# Patient Record
Sex: Male | Born: 1960 | Race: Black or African American | Hispanic: No | Smoking: Former smoker
Health system: Southern US, Community
[De-identification: ages and names within clinical notes are randomized; demographics above are authoritative.]

## PROBLEM LIST (undated history)

## (undated) DIAGNOSIS — K219 Gastro-esophageal reflux disease without esophagitis: Secondary | ICD-10-CM

## (undated) DIAGNOSIS — E785 Hyperlipidemia, unspecified: Secondary | ICD-10-CM

## (undated) DIAGNOSIS — R972 Elevated prostate specific antigen [PSA]: Secondary | ICD-10-CM

## (undated) DIAGNOSIS — G4733 Obstructive sleep apnea (adult) (pediatric): Secondary | ICD-10-CM

## (undated) DIAGNOSIS — F32A Depression, unspecified: Secondary | ICD-10-CM

## (undated) DIAGNOSIS — I10 Essential (primary) hypertension: Secondary | ICD-10-CM

## (undated) DIAGNOSIS — J189 Pneumonia, unspecified organism: Secondary | ICD-10-CM

## (undated) DIAGNOSIS — Z9989 Dependence on other enabling machines and devices: Secondary | ICD-10-CM

## (undated) DIAGNOSIS — E119 Type 2 diabetes mellitus without complications: Secondary | ICD-10-CM

## (undated) DIAGNOSIS — J9859 Other diseases of mediastinum, not elsewhere classified: Secondary | ICD-10-CM

## (undated) DIAGNOSIS — N529 Male erectile dysfunction, unspecified: Secondary | ICD-10-CM

## (undated) HISTORY — PX: PROSTATE BIOPSY: SHX241

---

## 1998-12-31 ENCOUNTER — Ambulatory Visit (HOSPITAL_BASED_OUTPATIENT_CLINIC_OR_DEPARTMENT_OTHER): Admission: RE | Admit: 1998-12-31 | Discharge: 1998-12-31 | Payer: Self-pay

## 2013-05-15 ENCOUNTER — Other Ambulatory Visit: Payer: Self-pay | Admitting: Family Medicine

## 2013-05-15 DIAGNOSIS — R1310 Dysphagia, unspecified: Secondary | ICD-10-CM

## 2013-05-21 ENCOUNTER — Ambulatory Visit
Admission: RE | Admit: 2013-05-21 | Discharge: 2013-05-21 | Disposition: A | Discharge status: Home / Self Care | Payer: 59 | Source: Ambulatory Visit | Attending: Family Medicine | Admitting: Family Medicine

## 2013-05-21 DIAGNOSIS — R1310 Dysphagia, unspecified: Secondary | ICD-10-CM

## 2014-08-10 ENCOUNTER — Ambulatory Visit
Admission: RE | Admit: 2014-08-10 | Discharge: 2014-08-10 | Disposition: A | Discharge status: Home / Self Care | Payer: 59 | Source: Ambulatory Visit | Attending: Family Medicine | Admitting: Family Medicine

## 2014-08-10 ENCOUNTER — Emergency Department (HOSPITAL_COMMUNITY): Payer: 59

## 2014-08-10 ENCOUNTER — Other Ambulatory Visit: Payer: Self-pay | Admitting: Family Medicine

## 2014-08-10 ENCOUNTER — Inpatient Hospital Stay (HOSPITAL_COMMUNITY)
Admission: EM | Admit: 2014-08-10 | Discharge: 2014-08-14 | DRG: 871 | Disposition: A | Discharge status: Home / Self Care | Payer: 59 | Attending: Internal Medicine | Admitting: Internal Medicine

## 2014-08-10 ENCOUNTER — Encounter (HOSPITAL_COMMUNITY): Payer: Self-pay | Admitting: Emergency Medicine

## 2014-08-10 DIAGNOSIS — D72829 Elevated white blood cell count, unspecified: Secondary | ICD-10-CM | POA: Diagnosis present

## 2014-08-10 DIAGNOSIS — R079 Chest pain, unspecified: Secondary | ICD-10-CM

## 2014-08-10 DIAGNOSIS — A419 Sepsis, unspecified organism: Principal | ICD-10-CM

## 2014-08-10 DIAGNOSIS — Z87891 Personal history of nicotine dependence: Secondary | ICD-10-CM

## 2014-08-10 DIAGNOSIS — F1010 Alcohol abuse, uncomplicated: Secondary | ICD-10-CM | POA: Diagnosis present

## 2014-08-10 DIAGNOSIS — K219 Gastro-esophageal reflux disease without esophagitis: Secondary | ICD-10-CM | POA: Diagnosis present

## 2014-08-10 DIAGNOSIS — B441 Other pulmonary aspergillosis: Secondary | ICD-10-CM | POA: Diagnosis present

## 2014-08-10 DIAGNOSIS — G4733 Obstructive sleep apnea (adult) (pediatric): Secondary | ICD-10-CM | POA: Diagnosis present

## 2014-08-10 DIAGNOSIS — J852 Abscess of lung without pneumonia: Secondary | ICD-10-CM | POA: Diagnosis present

## 2014-08-10 DIAGNOSIS — R0602 Shortness of breath: Secondary | ICD-10-CM | POA: Diagnosis not present

## 2014-08-10 DIAGNOSIS — J189 Pneumonia, unspecified organism: Secondary | ICD-10-CM | POA: Diagnosis present

## 2014-08-10 DIAGNOSIS — J9600 Acute respiratory failure, unspecified whether with hypoxia or hypercapnia: Secondary | ICD-10-CM | POA: Diagnosis present

## 2014-08-10 DIAGNOSIS — Z79899 Other long term (current) drug therapy: Secondary | ICD-10-CM | POA: Diagnosis not present

## 2014-08-10 DIAGNOSIS — L723 Sebaceous cyst: Secondary | ICD-10-CM | POA: Diagnosis present

## 2014-08-10 DIAGNOSIS — E669 Obesity, unspecified: Secondary | ICD-10-CM | POA: Diagnosis present

## 2014-08-10 DIAGNOSIS — J984 Other disorders of lung: Secondary | ICD-10-CM

## 2014-08-10 DIAGNOSIS — E785 Hyperlipidemia, unspecified: Secondary | ICD-10-CM | POA: Diagnosis present

## 2014-08-10 DIAGNOSIS — Z6836 Body mass index (BMI) 36.0-36.9, adult: Secondary | ICD-10-CM

## 2014-08-10 DIAGNOSIS — J9601 Acute respiratory failure with hypoxia: Secondary | ICD-10-CM

## 2014-08-10 HISTORY — DX: Pneumonia, unspecified organism: J18.9

## 2014-08-10 HISTORY — DX: Hyperlipidemia, unspecified: E78.5

## 2014-08-10 HISTORY — DX: Obstructive sleep apnea (adult) (pediatric): Z99.89

## 2014-08-10 HISTORY — DX: Gastro-esophageal reflux disease without esophagitis: K21.9

## 2014-08-10 HISTORY — DX: Obstructive sleep apnea (adult) (pediatric): G47.33

## 2014-08-10 LAB — CBC
HEMATOCRIT: 41.2 % (ref 39.0–52.0)
Hemoglobin: 13.7 g/dL (ref 13.0–17.0)
MCH: 27.5 pg (ref 26.0–34.0)
MCHC: 33.3 g/dL (ref 30.0–36.0)
MCV: 82.7 fL (ref 78.0–100.0)
Platelets: 301 10*3/uL (ref 150–400)
RBC: 4.98 MIL/uL (ref 4.22–5.81)
RDW: 13.9 % (ref 11.5–15.5)
WBC: 16.3 10*3/uL — ABNORMAL HIGH (ref 4.0–10.5)

## 2014-08-10 LAB — BASIC METABOLIC PANEL
Anion gap: 18 — ABNORMAL HIGH (ref 5–15)
BUN: 13 mg/dL (ref 6–23)
CO2: 23 mEq/L (ref 19–32)
Calcium: 9.2 mg/dL (ref 8.4–10.5)
Chloride: 94 mEq/L — ABNORMAL LOW (ref 96–112)
Creatinine, Ser: 0.98 mg/dL (ref 0.50–1.35)
GFR calc Af Amer: >90 mL/min (ref >90)
GLUCOSE: 94 mg/dL (ref 70–99)
Potassium: 4.1 mEq/L (ref 3.7–5.3)
SODIUM: 135 meq/L — AB (ref 137–147)

## 2014-08-10 LAB — I-STAT TROPONIN, ED: Troponin i, poc: 0.11 ng/mL (ref 0.00–0.08)

## 2014-08-10 LAB — TROPONIN I: Troponin I: <0.3 ng/mL (ref <0.30)

## 2014-08-10 LAB — RAPID HIV SCREEN (WH-MAU): SUDS RAPID HIV SCREEN: NONREACTIVE

## 2014-08-10 MED ORDER — ACETAMINOPHEN 325 MG PO TABS
650.0000 mg | ORAL_TABLET | Freq: Once | ORAL | Status: AC
  Administered 2014-08-10: 650 mg via ORAL
  Filled 2014-08-10: qty 2

## 2014-08-10 MED ORDER — SODIUM CHLORIDE 0.9 % IV SOLN
1250.0000 mg | Freq: Two times a day (BID) | INTRAVENOUS | Status: DC
  Administered 2014-08-11 – 2014-08-14 (×7): 1250 mg via INTRAVENOUS
  Filled 2014-08-10 (×9): qty 1250

## 2014-08-10 MED ORDER — PANTOPRAZOLE SODIUM 40 MG PO TBEC
40.0000 mg | DELAYED_RELEASE_TABLET | Freq: Every day | ORAL | Status: DC
  Administered 2014-08-11 – 2014-08-14 (×4): 40 mg via ORAL
  Filled 2014-08-10 (×4): qty 1

## 2014-08-10 MED ORDER — ONDANSETRON HCL 4 MG PO TABS: 4.0000 mg | ORAL_TABLET | Freq: Four times a day (QID) | ORAL | Status: DC | PRN

## 2014-08-10 MED ORDER — HYDROMORPHONE HCL PF 1 MG/ML IJ SOLN
1.0000 mg | Freq: Once | INTRAMUSCULAR | Status: AC
  Administered 2014-08-10: 1 mg via INTRAVENOUS
  Filled 2014-08-10: qty 1

## 2014-08-10 MED ORDER — IPRATROPIUM BROMIDE 0.02 % IN SOLN
0.5000 mg | RESPIRATORY_TRACT | Status: DC | PRN
Start: 2014-08-10 — End: 2014-08-14

## 2014-08-10 MED ORDER — SIMVASTATIN 20 MG PO TABS
20.0000 mg | ORAL_TABLET | Freq: Every day | ORAL | Status: DC
  Administered 2014-08-11 – 2014-08-13 (×3): 20 mg via ORAL
  Filled 2014-08-10 (×4): qty 1
  Filled 2014-08-10: qty 2

## 2014-08-10 MED ORDER — VANCOMYCIN HCL 10 G IV SOLR
2000.0000 mg | INTRAVENOUS | Status: AC
  Filled 2014-08-10: qty 2000

## 2014-08-10 MED ORDER — ACETAMINOPHEN 650 MG RE SUPP: 650.0000 mg | Freq: Four times a day (QID) | RECTAL | Status: DC | PRN

## 2014-08-10 MED ORDER — PIPERACILLIN-TAZOBACTAM 3.375 G IVPB 30 MIN
3.3750 g | INTRAVENOUS | Status: AC
  Administered 2014-08-10: 3.375 g via INTRAVENOUS
  Filled 2014-08-10: qty 50

## 2014-08-10 MED ORDER — ACETAMINOPHEN 325 MG PO TABS
650.0000 mg | ORAL_TABLET | Freq: Four times a day (QID) | ORAL | Status: DC | PRN
  Administered 2014-08-11 – 2014-08-12 (×5): 650 mg via ORAL
  Filled 2014-08-10 (×5): qty 2

## 2014-08-10 MED ORDER — SODIUM CHLORIDE 0.9 % IV SOLN
INTRAVENOUS | Status: AC
  Administered 2014-08-11: 02:00:00 via INTRAVENOUS

## 2014-08-10 MED ORDER — PIPERACILLIN-TAZOBACTAM 3.375 G IVPB
3.3750 g | Freq: Three times a day (TID) | INTRAVENOUS | Status: DC
  Administered 2014-08-11 – 2014-08-14 (×10): 3.375 g via INTRAVENOUS
  Filled 2014-08-10 (×14): qty 50

## 2014-08-10 MED ORDER — IOHEXOL 300 MG/ML  SOLN
80.0000 mL | Freq: Once | INTRAMUSCULAR | Status: AC | PRN
  Administered 2014-08-10: 80 mL via INTRAVENOUS

## 2014-08-10 MED ORDER — SODIUM CHLORIDE 0.9 % IV BOLUS (SEPSIS)
1000.0000 mL | INTRAVENOUS | Status: AC
  Administered 2014-08-10: 1000 mL via INTRAVENOUS

## 2014-08-10 MED ORDER — LEVALBUTEROL HCL 0.63 MG/3ML IN NEBU
0.6300 mg | INHALATION_SOLUTION | RESPIRATORY_TRACT | Status: DC | PRN
  Filled 2014-08-10: qty 3

## 2014-08-10 MED ORDER — SODIUM CHLORIDE 0.9 % IV SOLN
INTRAVENOUS | Status: AC
  Administered 2014-08-10: via INTRAVENOUS

## 2014-08-10 MED ORDER — ONDANSETRON HCL 4 MG/2ML IJ SOLN: 4.0000 mg | Freq: Four times a day (QID) | INTRAMUSCULAR | Status: DC | PRN

## 2014-08-10 MED ORDER — ENOXAPARIN SODIUM 40 MG/0.4ML ~~LOC~~ SOLN
40.0000 mg | SUBCUTANEOUS | Status: DC
  Administered 2014-08-10: 40 mg via SUBCUTANEOUS
  Filled 2014-08-10 (×2): qty 0.4

## 2014-08-10 NOTE — ED Notes (Signed)
Pt arrives via POV from home with cough, fever to 103.2 on Friday. Seen by [facility] Friday. Started on Aleve. Went to see Pcp today had bloodwork and chest xray which showed r lobe pneumonia and pulmonary absess. . Provider wants to rule out TB. Pt denies bloody cough, weight loss, states some night sweats. Placed on droplet precautions at present. Last aleve 10am and last tylenol 1300. Pt awake, alert, oriented x4, NAD at present.

## 2014-08-10 NOTE — Progress Notes (Signed)
Received pt report from Danielle, RN -ED. 

## 2014-08-10 NOTE — ED Notes (Signed)
Harrison, MD aware of abnormal lab test results 

## 2014-08-10 NOTE — H&P (Addendum)
[facility] History and Physical  Koben Daman ION:[id number] DOB: [birth year] DOA: 08/10/2014  Referring physician: ER physician PCP: No primary provider on file.   Chief Complaint: shortness of breath, chest pain  HPI:  53 year old male with no significant past medical history who presented to [facility] ED 08/10/2014 with complaints of cough, shortness of breath, fever for past few days prior to this admission. He was seen by PCP prior to coming to ED and his CXR showed possible lung abscess or TB and he was told to go to ED for further evaluation. Pt reported having some night sweats but no weight loss. He had some right chest pain, non radiating, 5/10 in intensity which was present at rest, not alleviated with aleve. Of note, he was in [place] for vacation this summer but did not report having these symptoms when he arrived home.  In ED, BP was 146/84, HR 107-125, RR 23, T max 103.1 F. Oxygen saturation was 91% with [place] oxygen support at 2 L. WBC count was 16.3 otherwise unremarkable. The troponin level was WNL. The 12 lead EKG showed sinus tachycardia. CXR showed cavitary and thick walled right lower lobe lesion with air-fluid level. CT chest showed large uniformly thick-walled cavitary lesion with air-fluid level over the superior segment of the right lower lobe measuring 8 x 10.4 cm, some cystic bronchiectatic changes, mediastinal/ subcarinal adenopathy and 3-4 mm nodule over the right upper lobe. cavitary process.  Assessment & Plan    Principal Problem:   Acute respiratory failure with hypoxia  Secondary to possible pneumonia, TB, possible malignancy  Respiratory status stable at this time. May use [place] for oxygen support to keep O2 saturation above 90%.  Nebulizer treatments every 4 hours PRN   Started antibiotics for possible pneumonia, vanco and zosyn. Pneumonia order set in place. Follow up blood work related to pneumonia order set.  Obtain AFB sputum x 3 to rule out  AFB.  Airborne and droplet precaution   Active Problems: Sepsis secondary to pneumonia   Sepsis criteria met with initial vitals HR 107-125, RR 23, T max 103.1 F, WBC count 16.3 and evidence of possible infectious process based on CT chest. Chest pain  Likely due to pneumonia, possible TB, ling abscess, malignancy  Troponin level is WNL. The 12 lead EKG showed sinus tachycardia CAP (community acquired pneumonia) / Leukocytosis  Management as noted above with vanco and zosyn for possible pneumonia.  Pneumonia order set in place. Follow up blood culture results,. Legionella, HIV, strep pneumoniae results. Follow up AFB sputum to rule out TB.  Asked pulmonary if bronch may be required for further evaluation.  Oxygen support via [place] oxygen support to keep O2 saturation above 90%  Cavitary lesion of lung, superior segment of right lower lobe  Obvious concern for TB as well as abscess and malignancy especially with additional lung nodule seen over right upper lung lobe.   Droplet precaution, airborne precaution  Obtain AFB sputum to rule out AFB DVT prophylaxis:   Lovenox subQ    Radiological Exams on Admission:  Dg Chest 2 View 08/10/2014   1. Cavitary and thick walled right lower lobe lesion with air-fluid level. Differential diagnosis includes cavitary pneumonia such a staphylococcal pneumonia; cavitary malignancy ; or a large pulmonary abscess. It is recommended that the patient proceed to the emergency room and subsequently undergo CT of the chest (with contrast if feasible) for further characterization.  This report is being upgraded to a call report to expedite treatment.  Ct Chest W Contrast 08/10/2014  Large uniformly thick-walled cavitary lesion with air-fluid level over the superior segment of the right lower lobe measuring 8 x 10.4 cm in AP and transverse dimension. Mild adjacent atelectasis and cystic bronchiectatic change. Mild associated mediastinal/ subcarinal  adenopathy. The main differential diagnosis remains cavitary pneumonia/ abscess versus malignancy.  3-4 mm nodule over the right upper lobe. Recommend followup as clinically indicated pending etiology of the above described cavitary process.    EKG: sinus tachycardia  Code Status: Full Family Communication: Plan of care discussed with the patient  Disposition Plan: Admit for further evaluation  Leisa Lenz, MD  [facility] Pager [phone]  Review of Systems:  Constitutional: positive for fever, chills and malaise/fatigue. Negative for diaphoresis.  HENT: Negative for hearing loss, ear pain, nosebleeds, congestion, sore throat, neck pain, tinnitus and ear discharge.   Eyes: Negative for blurred vision, double vision, photophobia, pain, discharge and redness.  Respiratory:   per HPI Cardiovascular: positive for chest pain, palpitations, orthopnea, claudication and leg swelling.  Gastrointestinal: Negative for nausea, vomiting and abdominal pain. Negative for heartburn, constipation, blood in stool and melena.  Genitourinary: Negative for dysuria, urgency, frequency, hematuria and flank pain.  Musculoskeletal: Negative for myalgias, back pain, joint pain and falls.  Skin: Negative for itching and rash.  Neurological: Negative for dizziness and weakness. Negative for tingling, tremors, sensory change, speech change, focal weakness, loss of consciousness and headaches.  Endo/Heme/Allergies: Negative for environmental allergies and polydipsia. Does not bruise/bleed easily.  Psychiatric/Behavioral: Negative for suicidal ideas. The patient is not nervous/anxious.      Past Medical History  Diagnosis Date  . Hyperlipemia   . GERD (gastroesophageal reflux disease)    No past surgical history on file. Social History:  reports that he quit smoking about 7 years ago. He does not have any smokeless tobacco history on file. He reports that he drinks alcohol. He reports that he does not use  illicit drugs.  No Known Allergies  Family History: htn in family    Prior to Admission medications   Medication Sig Start Date End Date Taking? Authorizing Provider  omeprazole (PRILOSEC) 20 MG capsule Take 20 mg by mouth daily.   Yes Historical Provider, MD  pravastatin (PRAVACHOL) 40 MG tablet Take 40 mg by mouth daily.   Yes Historical Provider, MD  sildenafil (VIAGRA) 100 MG tablet Take 100 mg by mouth daily as needed for erectile dysfunction.   Yes Historical Provider, MD   Physical Exam: Filed Vitals:   08/10/14 1900 08/10/14 1915 08/10/14 1930 08/10/14 1945  BP: 171/94 159/88 147/79 146/77  Pulse: 109 107 110 109  Temp:      TempSrc:      Resp: 18 0 23 20  SpO2: 96% 91% 93% 93%    Physical Exam  Constitutional: Appears well-developed and well-nourished. No distress.  HENT: Normocephalic. No tonsillar erythema or exudates Eyes: Conjunctivae and EOM are normal. PERRLA, no scleral icterus.  Neck: Normal ROM. Neck supple. No JVD. No tracheal deviation. No thyromegaly.  CVS: RRR, S1/S2 +, no murmurs, no gallops, no carotid bruit.  Pulmonary: Effort and breath sounds normal, no stridor, rhonchi, wheezes, rales.  Abdominal: Soft. BS +,  no distension, tenderness, rebound or guarding.  Musculoskeletal: Normal range of motion. No edema and no tenderness.  Lymphadenopathy: No lymphadenopathy noted, cervical, inguinal. Neuro: Alert. Normal reflexes, muscle tone coordination. No focal neurologic deficits. Skin: Skin is warm and dry. No rash noted. Not diaphoretic. No erythema. No pallor.  Psychiatric: Normal mood and affect. Behavior, judgment, thought content normal.   Labs on Admission:  Basic Metabolic Panel:  Recent Labs Lab 08/10/14 1733  NA 135*  K 4.1  CL 94*  CO2 23  GLUCOSE 94  BUN 13  CREATININE 0.98  CALCIUM 9.2   Liver Function Tests: No results found for this basename: AST, ALT, ALKPHOS, BILITOT, PROT, ALBUMIN,  in the last 168 hours No results found  for this basename: LIPASE, AMYLASE,  in the last 168 hours No results found for this basename: AMMONIA,  in the last 168 hours CBC:  Recent Labs Lab 08/10/14 1733  WBC 16.3*  HGB 13.7  HCT 41.2  MCV 82.7  PLT 301   Cardiac Enzymes:  Recent Labs Lab 08/10/14 1826  TROPONINI <0.30   BNP: No components found with this basename: POCBNP,  CBG: No results found for this basename: GLUCAP,  in the last 168 hours  If 7PM-7AM, please contact night-coverage [email or web address] Password [facility] 08/10/2014, 8:07 PM

## 2014-08-10 NOTE — Progress Notes (Signed)
ANTIBIOTIC CONSULT NOTE - INITIAL  Pharmacy Consult for Vancomycin and Zosyn Indication: pneumonia  No Known Allergies  Patient Measurements: Height: 6\' 2"  (188 cm) Weight: 285 lb (129.275 kg) IBW/kg (Calculated) : 82.2 Adjusted Body Weight: 97 kg  Vital Signs: Temp: 103.1 F (39.5 C) (09/14 1658) Temp src: Oral (09/14 1658) BP: 146/77 mmHg (09/14 1945) Pulse Rate: 109 (09/14 1945) Intake/Output from previous day:   Intake/Output from this shift:    Labs:  Recent Labs  08/10/14 1733  WBC 16.3*  HGB 13.7  PLT 301  CREATININE 0.98   Estimated Creatinine Clearance: 128.8 ml/min (by C-G formula based on Cr of 0.98). No results found for this basename: VANCOTROUGH, VANCOPEAK, VANCORANDOM, GENTTROUGH, GENTPEAK, GENTRANDOM, TOBRATROUGH, TOBRAPEAK, TOBRARND, AMIKACINPEAK, AMIKACINTROU, AMIKACIN,  in the last 72 hours   Microbiology: No results found for this or any previous visit (from the past 720 hour(s)).  Medical History: Past Medical History  Diagnosis Date  . Hyperlipemia   . GERD (gastroesophageal reflux disease)     Medications:  See electronic med rec  Assessment: 53 y.o. male presents with cough and fever. CXR at PCP showed R lobe PNA and pulmonary abscess so sent to the ED. Need to r/o TB. Pt received Zosyn 3.375gm in ED ~1950 and orders for Vancomycin 2gm to be given. To continue broad spectrum antibiotics for PNA. Estimated CrCl > 100 ml/min.  Goal of Therapy:  Vancomycin trough level 15-20 mcg/ml  Plan:  1. Vancomycin 2gm IV now (as ordered by MD) then 1250mg  IV q12h 2. Zosyn 3.375gm IV q8h -each dose over 4 hours 3. Will f/u Vanc trough at [facility] in obese pt 4. Will f/u renal function, micro data, and pt's clinical condition  Sherlon Handing, PharmD, BCPS Clinical pharmacist, pager [phone] 08/10/2014,8:25 PM

## 2014-08-10 NOTE — ED Notes (Addendum)
IV antibiotics started before set #2 of blood cultures was drawn. Dr. Aline Brochure made aware, verbal order to still collect set #2.

## 2014-08-10 NOTE — ED Provider Notes (Signed)
CSN: [id number]     Arrival date & time 08/10/14  1635 History   First MD Initiated Contact with Patient 08/10/14 1742     Chief Complaint  Patient presents with  . Shortness of Breath  . Fever     (Consider location/radiation/quality/duration/timing/severity/associated sxs/prior Treatment) Patient is a 53 y.o. male presenting with fever and chest pain. The history is provided by the patient.  Fever Associated symptoms: chest pain   Associated symptoms: no diarrhea, no dysuria, no nausea and no rhinorrhea   Chest Pain Pain location:  R chest Pain quality: sharp   Pain radiates to:  Does not radiate Pain radiates to the back: yes   Pain severity:  Mild Onset quality:  Gradual Duration:  1 day Timing:  Constant Progression:  Unchanged Chronicity:  New Context: breathing and at rest   Relieved by:  Nothing Worsened by:  Nothing tried Ineffective treatments:  None tried Associated symptoms: fever   Associated symptoms: no nausea and no numbness     Past Medical History  Diagnosis Date  . Hyperlipemia   . GERD (gastroesophageal reflux disease)    No past surgical history on file. No family history on file. History  Substance Use Topics  . Smoking status: Former Smoker    Quit date: 11/27/2006  . Smokeless tobacco: Not on file  . Alcohol Use: Yes     Comment: occasional    Review of Systems  Constitutional: Positive for fever.  HENT: Negative for drooling and rhinorrhea.   Eyes: Negative for pain.  Cardiovascular: Positive for chest pain. Negative for leg swelling.  Gastrointestinal: Negative for nausea and diarrhea.  Genitourinary: Negative for dysuria and hematuria.  Musculoskeletal: Negative for gait problem.  Skin: Negative for color change.  Neurological: Negative for numbness.  Hematological: Negative for adenopathy.  Psychiatric/Behavioral: Negative for behavioral problems.  All other systems reviewed and are negative.     Allergies  Review of  patient's allergies indicates no known allergies.  Home Medications   Prior to Admission medications   Not on File   BP 150/82  Pulse 112  Temp(Src) 103.1 F (39.5 C) (Oral)  Resp 21  SpO2 98% Physical Exam  Nursing note and vitals reviewed. Constitutional: He is oriented to person, place, and time. He appears well-developed and well-nourished.  HENT:  Head: Normocephalic and atraumatic.  Right Ear: External ear normal.  Left Ear: External ear normal.  Nose: Nose normal.  Mouth/Throat: Oropharynx is clear and moist. No oropharyngeal exudate.  Eyes: Conjunctivae and EOM are normal. Pupils are equal, round, and reactive to light.  Neck: Normal range of motion. Neck supple.  Cardiovascular: Regular rhythm, normal heart sounds and intact distal pulses.  Exam reveals no gallop and no friction rub.   No murmur heard. HR 112  Pulmonary/Chest: Effort normal. No respiratory distress. He has no wheezes.  Mildly diminished breath sounds in right lung base.  Abdominal: Soft. Bowel sounds are normal. He exhibits no distension. There is no tenderness. There is no rebound and no guarding.  Musculoskeletal: Normal range of motion. He exhibits no edema and no tenderness.  Neurological: He is alert and oriented to person, place, and time.  Skin: Skin is warm and dry.  Psychiatric: He has a normal mood and affect. His behavior is normal.    ED Course  Procedures (including critical care time) Labs Review Labs Reviewed  CBC - Abnormal; Notable for the following:    WBC 16.3 (*)    All other components  within normal limits  I-STAT TROPOININ, ED - Abnormal; Notable for the following:    Troponin i, poc 0.11 (*)    All other components within normal limits  BASIC METABOLIC PANEL    Imaging Review Dg Chest 2 View  08/10/2014   CLINICAL DATA:  Right-sided chest pain. Difficulty breathing for 2 days.  EXAM: CHEST  2 VIEW  COMPARISON:  05/21/2013 upper GI examination.  FINDINGS: Cavitary 12.9  cm in diameter right lower lobe lesion with internal air- fluid level. The left lung appears clear. Heart size within normal limits.  IMPRESSION: 1. Cavitary and thick walled right lower lobe lesion with air-fluid level. Differential diagnosis includes cavitary pneumonia such a staphylococcal pneumonia; cavitary malignancy ; or a large pulmonary abscess. It is recommended that the patient proceed to the emergency room and subsequently undergo CT of the chest (with contrast if feasible) for further characterization.  This report is being upgraded to a call report to expedite treatment.   Electronically Signed   By: Sherryl Barters M.D.   On: 08/10/2014 15:53     EKG Interpretation   Date/Time:  Monday August 10 2014 16:50:49 EDT Ventricular Rate:  122 PR Interval:  142 QRS Duration: 84 QT Interval:  320 QTC Calculation: 456 R Axis:   -45 Text Interpretation:  Sinus tachycardia Left anterior fascicular block  Abnormal ECG Confirmed by Maili Shutters  MD, Zigmund Linse ([id number]) on 08/10/2014  5:58:08 PM      MDM   Final diagnoses:  Pulmonary abscess  Sepsis, due to unspecified organism    6:11 PM 53 y.o. male who presents with fever and fatigue which began 4 days ago. He developed right-sided sharp back and chest pains while breathing last night. He had a chest x-ray today with which was concerning for a right-sided pulmonary abscess. He is febrile and tachycardic on exam. He is otherwise well appearing. He is not hypoxic and has no appreciable increased worker breathing. Will get CT as requested by radiologist on chest x-ray for further characterization of the right-sided pulmonary abscess. Will give Tylenol, IV fluids, and pain control. Will cover w/ broad spectrum abx.   Discussed case w/ pulmonary who will see the pt tomorrow. Will admit to hospitalist.     Pamella Pert, MD 08/10/14 2213

## 2014-08-11 DIAGNOSIS — F1010 Alcohol abuse, uncomplicated: Secondary | ICD-10-CM

## 2014-08-11 DIAGNOSIS — J852 Abscess of lung without pneumonia: Secondary | ICD-10-CM

## 2014-08-11 DIAGNOSIS — R079 Chest pain, unspecified: Secondary | ICD-10-CM

## 2014-08-11 LAB — STREP PNEUMONIAE URINARY ANTIGEN: STREP PNEUMO URINARY ANTIGEN: NEGATIVE

## 2014-08-11 LAB — CBC WITH DIFFERENTIAL/PLATELET
BASOS ABS: 0 10*3/uL (ref 0.0–0.1)
BASOS PCT: 0 % (ref 0–1)
Eosinophils Absolute: 0.2 10*3/uL (ref 0.0–0.7)
Eosinophils Relative: 1 % (ref 0–5)
HCT: 39.9 % (ref 39.0–52.0)
HEMOGLOBIN: 13.2 g/dL (ref 13.0–17.0)
LYMPHS PCT: 9 % — AB (ref 12–46)
Lymphs Abs: 1.6 10*3/uL (ref 0.7–4.0)
MCH: 27 pg (ref 26.0–34.0)
MCHC: 33.1 g/dL (ref 30.0–36.0)
MCV: 81.8 fL (ref 78.0–100.0)
MONO ABS: 1.3 10*3/uL — AB (ref 0.1–1.0)
Monocytes Relative: 7 % (ref 3–12)
NEUTROS PCT: 83 % — AB (ref 43–77)
Neutro Abs: 15.1 10*3/uL — ABNORMAL HIGH (ref 1.7–7.7)
PLATELETS: 309 10*3/uL (ref 150–400)
RBC: 4.88 MIL/uL (ref 4.22–5.81)
RDW: 14 % (ref 11.5–15.5)
WBC: 18.2 10*3/uL — ABNORMAL HIGH (ref 4.0–10.5)

## 2014-08-11 LAB — URINALYSIS, ROUTINE W REFLEX MICROSCOPIC
Glucose, UA: NEGATIVE mg/dL
Ketones, ur: 40 mg/dL — AB
LEUKOCYTES UA: NEGATIVE
Nitrite: NEGATIVE
PH: 5.5 (ref 5.0–8.0)
Protein, ur: 100 mg/dL — AB
Specific Gravity, Urine: 1.01 (ref 1.005–1.030)
Urobilinogen, UA: 1 mg/dL (ref 0.0–1.0)

## 2014-08-11 LAB — COMPREHENSIVE METABOLIC PANEL
ALBUMIN: 2.6 g/dL — AB (ref 3.5–5.2)
ALBUMIN: 2.9 g/dL — AB (ref 3.5–5.2)
ALT: 25 U/L (ref 0–53)
ALT: 25 U/L (ref 0–53)
ANION GAP: 14 (ref 5–15)
ANION GAP: 15 (ref 5–15)
AST: 26 U/L (ref 0–37)
AST: 24 U/L (ref 0–37)
Alkaline Phosphatase: 130 U/L — ABNORMAL HIGH (ref 39–117)
Alkaline Phosphatase: 140 U/L — ABNORMAL HIGH (ref 39–117)
BUN: 11 mg/dL (ref 6–23)
BUN: 13 mg/dL (ref 6–23)
CALCIUM: 8.8 mg/dL (ref 8.4–10.5)
CO2: 23 mEq/L (ref 19–32)
CO2: 25 mEq/L (ref 19–32)
CREATININE: 0.97 mg/dL (ref 0.50–1.35)
Calcium: 8.7 mg/dL (ref 8.4–10.5)
Chloride: 97 mEq/L (ref 96–112)
Chloride: 99 mEq/L (ref 96–112)
Creatinine, Ser: 1.07 mg/dL (ref 0.50–1.35)
GFR calc Af Amer: >90 mL/min (ref >90)
GFR calc non Af Amer: 79 mL/min — ABNORMAL LOW (ref >90)
GFR calc non Af Amer: >90 mL/min (ref >90)
Glucose, Bld: 102 mg/dL — ABNORMAL HIGH (ref 70–99)
Glucose, Bld: 88 mg/dL (ref 70–99)
POTASSIUM: 4.3 meq/L (ref 3.7–5.3)
Potassium: 3.5 mEq/L — ABNORMAL LOW (ref 3.7–5.3)
Sodium: 136 mEq/L — ABNORMAL LOW (ref 137–147)
Sodium: 137 mEq/L (ref 137–147)
TOTAL PROTEIN: 7.6 g/dL (ref 6.0–8.3)
Total Bilirubin: 0.6 mg/dL (ref 0.3–1.2)
Total Bilirubin: 0.7 mg/dL (ref 0.3–1.2)
Total Protein: 8.3 g/dL (ref 6.0–8.3)

## 2014-08-11 LAB — CBC
HEMATOCRIT: 37 % — AB (ref 39.0–52.0)
HEMOGLOBIN: 12.5 g/dL — AB (ref 13.0–17.0)
MCH: 27.2 pg (ref 26.0–34.0)
MCHC: 33.8 g/dL (ref 30.0–36.0)
MCV: 80.6 fL (ref 78.0–100.0)
Platelets: 302 10*3/uL (ref 150–400)
RBC: 4.59 MIL/uL (ref 4.22–5.81)
RDW: 14 % (ref 11.5–15.5)
WBC: 15.1 10*3/uL — AB (ref 4.0–10.5)

## 2014-08-11 LAB — TSH: TSH: 3.64 u[IU]/mL (ref 0.350–4.500)

## 2014-08-11 LAB — EXPECTORATED SPUTUM ASSESSMENT W REFEX TO RESP CULTURE: SPECIAL REQUESTS: NORMAL

## 2014-08-11 LAB — EXPECTORATED SPUTUM ASSESSMENT W GRAM STAIN, RFLX TO RESP C

## 2014-08-11 LAB — APTT: APTT: 33 s (ref 24–37)

## 2014-08-11 LAB — GRAM STAIN

## 2014-08-11 LAB — LEGIONELLA ANTIGEN, URINE: LEGIONELLA ANTIGEN, URINE: NEGATIVE

## 2014-08-11 LAB — HIV ANTIBODY (ROUTINE TESTING W REFLEX): HIV 1&2 Ab, 4th Generation: NONREACTIVE

## 2014-08-11 LAB — URINE MICROSCOPIC-ADD ON

## 2014-08-11 LAB — INFLUENZA PANEL BY PCR (TYPE A & B)
H1N1FLUPCR: NOT DETECTED
Influenza A By PCR: NEGATIVE
Influenza B By PCR: NEGATIVE

## 2014-08-11 LAB — PROTIME-INR
INR: 1.26 (ref 0.00–1.49)
PROTHROMBIN TIME: 15.8 s — AB (ref 11.6–15.2)

## 2014-08-11 LAB — PHOSPHORUS: PHOSPHORUS: 3.7 mg/dL (ref 2.3–4.6)

## 2014-08-11 LAB — MAGNESIUM: Magnesium: 2.1 mg/dL (ref 1.5–2.5)

## 2014-08-11 MED ORDER — ENOXAPARIN SODIUM 60 MG/0.6ML ~~LOC~~ SOLN
60.0000 mg | SUBCUTANEOUS | Status: DC
  Administered 2014-08-11 – 2014-08-13 (×3): 60 mg via SUBCUTANEOUS
  Filled 2014-08-11 (×4): qty 0.6

## 2014-08-11 MED ORDER — IBUPROFEN 600 MG PO TABS
600.0000 mg | ORAL_TABLET | Freq: Once | ORAL | Status: AC
  Administered 2014-08-11: 600 mg via ORAL
  Filled 2014-08-11: qty 1

## 2014-08-11 NOTE — Consult Note (Addendum)
Name: Christian Hubbard MRN: [id number] DOB: Sep 19, 1961    ADMISSION DATE:  08/10/2014 CONSULTATION DATE:  08/10/2014  REFERRING MD :  Charlies Silvers PRIMARY SERVICE:  TRH  CHIEF COMPLAINT:  fevers  BRIEF PATIENT DESCRIPTION: 53 yo male with OSA presented to [facility] ED 9/14 c/o cough, fevers, SOB. CXR showed cavitary lesion in RLL. PCCM asked to see for further eval.  Drinks upto 6-12 beers/d, denies passing out, no dental issues  SIGNIFICANT EVENTS / STUDIES:  9/14 CT chest > large cavitary lesion with air fluid level in RLL, 3-4 mm nodule RUL  LINES / TUBES: PIV  CULTURES: Sputum 9/14>>> Urine 9/14>>> AFB 9/14>>> Blood 9/14>>>  ANTIBIOTICS: Vanc 9/14 >>> Zosyn 9/14 >>>  HISTORY OF PRESENT ILLNESS:  53 yo male with PHE as below, which includes OSA on CPAP and GERD who presented to [facility] ED 9/14 with complaints of cough, shortness of breath, fever for past few days prior to this admission. He was seen by PCP prior to coming to ED and his CXR showed possible lung abscess or TB and he was told to go to ED for further evaluation. Pt reported having some night sweats but no weight loss, was also in [place] recently, but no obvious sick contacts. He reports that he is not a smoker, but does drink alcohol, about 5-6 cans of beer 3 times per week. Also has exposure to chemicals in his occupation.    PAST MEDICAL HISTORY :  Past Medical History  Diagnosis Date  . Hyperlipemia   . GERD (gastroesophageal reflux disease)   . OSA on CPAP   . Pneumonia 08/10/2014   Past Surgical History  Procedure Laterality Date  . No past surgeries     Prior to Admission medications   Medication Sig Start Date End Date Taking? Authorizing Provider  omeprazole (PRILOSEC) 20 MG capsule Take 20 mg by mouth daily.   Yes Historical Provider, MD  pravastatin (PRAVACHOL) 40 MG tablet Take 40 mg by mouth daily.   Yes Historical Provider, MD  sildenafil (VIAGRA) 100 MG tablet Take 100 mg by mouth daily as needed for  erectile dysfunction.   Yes Historical Provider, MD   No Known Allergies  FAMILY HISTORY:  History reviewed. No pertinent family history. SOCIAL HISTORY:  reports that he quit smoking about 9 years ago. His smoking use included Cigarettes. He has a 24 pack-year smoking history. He has never used smokeless tobacco. He reports that he drinks about 14.4 ounces of alcohol per week. He reports that he does not use illicit drugs.  REVIEW OF SYSTEMS:   Bolds are positive  Constitutional: weight loss, gain, night sweats, Fevers, chills, fatigue .  HEENT: headaches, Sore throat, sneezing, nasal congestion, post nasal drip, Difficulty swallowing, Tooth/dental problems, visual complaints visual changes, ear ache CV:  chest pain, radiates: ,Orthopnea, PND, swelling in lower extremities, dizziness, palpitations, syncope.  GI  heartburn, indigestion, abdominal pain, nausea, vomiting, diarrhea, change in bowel habits, loss of appetite, bloody stools.  Resp: cough, non-productive: , hemoptysis, dyspnea, chest pain, pleuritic.  Skin: rash or itching or icterus GU: dysuria, change in color of urine, urgency or frequency. flank pain, hematuria  MS: joint pain or swelling. decreased range of motion  Psych: change in mood or affect. depression or anxiety.  Neuro: difficulty with speech, weakness, numbness, ataxia   SUBJECTIVE:   VITAL SIGNS: Temp:  [98.5 F (36.9 C)-103.1 F (39.5 C)] 98.5 F (36.9 C) (09/14 2220) Pulse Rate:  [97-125] 97 (09/14 2220) Resp:  [  0-23] 18 (09/14 2220) BP: (118-171)/(77-94) 133/87 mmHg (09/14 2220) SpO2:  [91 %-98 %] 96 % (09/14 2220) Weight:  [129.275 kg (285 lb)] 129.275 kg (285 lb) (09/14 2220)  PHYSICAL EXAMINATION: General:  Male, obese, in NAD Neuro:  Awake, alert, oriented. No focal deficit HEENT:  [place]/AT, PERRL, No JVD noted Cardiovascular:  RRR, tachy Lungs:  Clear, resps even unlabored Abdomen:  Soft, non-tender Musculoskeletal:  No acute deformity or ROM  limitation Skin:  Intact   Recent Labs Lab 08/10/14 1733 08/10/14 2333  NA 135* 137  K 4.1 3.5*  CL 94* 97  CO2 23 25  BUN 13 13  CREATININE 0.98 1.07  GLUCOSE 94 88    Recent Labs Lab 08/10/14 1733 08/10/14 2333  HGB 13.7 13.2  HCT 41.2 39.9  WBC 16.3* 18.2*  PLT 301 309   Dg Chest 2 View  08/10/2014   CLINICAL DATA:  Right-sided chest pain. Difficulty breathing for 2 days.  EXAM: CHEST  2 VIEW  COMPARISON:  05/21/2013 upper GI examination.  FINDINGS: Cavitary 12.9 cm in diameter right lower lobe lesion with internal air- fluid level. The left lung appears clear. Heart size within normal limits.  IMPRESSION: 1. Cavitary and thick walled right lower lobe lesion with air-fluid level. Differential diagnosis includes cavitary pneumonia such a staphylococcal pneumonia; cavitary malignancy ; or a large pulmonary abscess. It is recommended that the patient proceed to the emergency room and subsequently undergo CT of the chest (with contrast if feasible) for further characterization.  This report is being upgraded to a call report to expedite treatment.   Electronically Signed   By: Sherryl Barters M.D.   On: 08/10/2014 15:53   Ct Chest W Contrast  08/10/2014   CLINICAL DATA:  Cavitary lesion on recent chest x-ray.  EXAM: CT CHEST WITH CONTRAST  TECHNIQUE: Multidetector CT imaging of the chest was performed during intravenous contrast administration.  CONTRAST:  52mL OMNIPAQUE IOHEXOL 300 MG/ML  SOLN  COMPARISON:  Chest x-ray today.  FINDINGS: Lungs are adequately inflated and demonstrate a large uniformly thick-walled cavitary lesion with air-fluid level over the superior segment of the right lower lobe measuring approximately 8 x 10.4 cm in AP and transverse dimensions. There is cystic bronchiectatic change along the anterior superior periphery of this cavitary lesion. There is mild adjacent atelectasis along the superior medial border. No significant effusion. 3-4 mm nodule over the  anterior right upper lobe. Left lung is otherwise clear.  Heart is normal size. There is mild mediastinal adenopathy with a 1 cm right peritracheal lymph node and 1.3 cm subcarinal lymph node.  Images through the upper abdomen demonstrate mild diffuse hepatic steatosis. Remaining bone the soft tissue structures are unremarkable.  IMPRESSION: Large uniformly thick-walled cavitary lesion with air-fluid level over the superior segment of the right lower lobe measuring 8 x 10.4 cm in AP and transverse dimension. Mild adjacent atelectasis and cystic bronchiectatic change. Mild associated mediastinal/ subcarinal adenopathy. The main differential diagnosis remains cavitary pneumonia/ abscess versus malignancy.  3-4 mm nodule over the right upper lobe. Recommend followup as clinically indicated pending etiology of the above described cavitary process.  These results will be called to the ordering clinician or representative by the Radiologist Assistant, and communication documented in the PACS or zVision Dashboard.   Electronically Signed   By: Marin Olp M.D.   On: 08/10/2014 19:03    ASSESSMENT / PLAN:  RLL cavitary lesion - likely abscess OSA on CPAP at home  Rec's  -  Supplemental O2 as needed to maintain SpO2 greater than 92%  - Continue empiric vancomycin and zosyn for now.   - Follow cultures  - Use home CPAP if able, if not will need machine for nocturnal use.   - No FOB indicated at this time  Georgann Housekeeper, ACNP [facility] Pager [phone] or [phone]  Attending note - Fever, leucocytosis & imaging c/w lung abscess. Will need long duration of antibiotics 4-6 weeks, can change to orals in 48h & FU imaging as outpt. Discussed with pt & wife, Santiago Glad Investment banker, corporate at [employer]) - he will try to abstain from ETOH & use probiotic. Mild mediastinal lymphadenopathy is likely reactive. His chest pain has decreased. Would also teach him postural drainage for RLL. RUL nodule of unclear  significance , will need FU imaging in this ex-heavy smoker, in 6 months.  Rigoberto Noel MD

## 2014-08-11 NOTE — Progress Notes (Signed)
[facility] PROGRESS NOTE  Christian Hubbard ZOX:[id number] DOB: 01/25/1961 DOA: 08/10/2014  PCP: Vena Austria, MD  Brief HPI: 53yo male with PMH as below who presented with shortness of breath and chest pain. He was found to have a lung abscess.  Past medical history:  Past Medical History  Diagnosis Date  . Hyperlipemia   . GERD (gastroesophageal reflux disease)   . OSA on CPAP   . Pneumonia 08/10/2014    Consultants: Pulmonology, ID  Procedures: None  Antibiotics: Vanc/Zosyn 9/15-->  Subjective: Patient feels slightly better. Still with some pain in right chest. Breathing is better. No chills.  Objective: Vital Signs  Filed Vitals:   08/10/14 2030 08/10/14 2220 08/11/14 0429 08/11/14 0837  BP: 136/90 133/87 141/84   Pulse: 106 97 104   Temp:  98.5 F (36.9 C) 100.7 F (38.2 C) 99.3 F (37.4 C)  TempSrc:  Oral Oral Oral  Resp: 17 18    Height:  6\' 2"  (1.88 m)    Weight:  129.275 kg (285 lb)    SpO2: 93% 96% 92%     Intake/Output Summary (Last 24 hours) at 08/11/14 1149 Last data filed at 08/11/14 0600  Gross per 24 hour  Intake 798.33 ml  Output      0 ml  Net 798.33 ml   Filed Weights   08/10/14 1945 08/10/14 2220  Weight: 129.275 kg (285 lb) 129.275 kg (285 lb)    General appearance: alert, cooperative, appears stated age and no distress Resp: decreased air entry right lung with crackles. No wheezing. Cardio: regular rate and rhythm, S1, S2 normal, no murmur, click, rub or gallop GI: soft, non-tender; bowel sounds normal; no masses,  no organomegaly Extremities: extremities normal, atraumatic, no cyanosis or edema Neurologic: No focal deficits  Lab Results:  Basic Metabolic Panel:  Recent Labs Lab 08/10/14 1733 08/10/14 2333 08/11/14 0635  NA 135* 137 136*  K 4.1 3.5* 4.3  CL 94* 97 99  CO2 23 25 23   GLUCOSE 94 88 102*  BUN 13 13 11   CREATININE 0.98 1.07 0.97  CALCIUM 9.2 8.8 8.7  MG  --  2.1  --   PHOS  --  3.7   --    Liver Function Tests:  Recent Labs Lab 08/10/14 2333 08/11/14 0635  AST 24 26  ALT 25 25  ALKPHOS 140* 130*  BILITOT 0.7 0.6  PROT 8.3 7.6  ALBUMIN 2.9* 2.6*   CBC:  Recent Labs Lab 08/10/14 1733 08/10/14 2333 08/11/14 0635  WBC 16.3* 18.2* 15.1*  NEUTROABS  --  15.1*  --   HGB 13.7 13.2 12.5*  HCT 41.2 39.9 37.0*  MCV 82.7 81.8 80.6  PLT 301 309 302   Cardiac Enzymes:  Recent Labs Lab 08/10/14 1826  TROPONINI <0.30     Recent Results (from the past 240 hour(s))  CULTURE, BLOOD (ROUTINE X 2)     Status: None   Collection Time    08/10/14  6:25 PM      Result Value Ref Range Status   Specimen Description BLOOD ARM LEFT   Final   Special Requests BOTTLES DRAWN AEROBIC AND ANAEROBIC 5CC   Final   Culture  Setup Time     Final   Value: 08/10/2014 22:34     Performed at [facility]   Culture     Final   Value:        BLOOD CULTURE RECEIVED NO GROWTH TO DATE CULTURE WILL BE HELD  FOR 5 DAYS BEFORE ISSUING A FINAL NEGATIVE REPORT     Performed at [facility]   Report Status PENDING   Incomplete  GRAM STAIN     Status: None   Collection Time    08/11/14  4:42 AM      Result Value Ref Range Status   Specimen Description SPUTUM   Final   Special Requests NONE   Final   Gram Stain     Final   Value: ABUNDANT WBC PRESENT,BOTH PMN AND MONONUCLEAR     FEW GRAM POSITIVE RODS     FEW GRAM NEGATIVE RODS     FEW GRAM POSITIVE COCCI IN PAIRS     Results Called toTish Frederickson [phone]   Report Status 08/11/2014 FINAL   Final  CULTURE, EXPECTORATED SPUTUM-ASSESSMENT     Status: None   Collection Time    08/11/14  4:42 AM      Result Value Ref Range Status   Specimen Description SPUTUM   Final   Special Requests NONE   Final   Sputum evaluation     Final   Value: MICROSCOPIC FINDINGS SUGGEST THAT THIS SPECIMEN IS NOT REPRESENTATIVE OF LOWER RESPIRATORY SECRETIONS. PLEASE RECOLLECT.     Results Called toTish Frederickson [phone]  Perlie Mayo   Report Status 08/11/2014 FINAL   Final      Studies/Results: Dg Chest 2 View  08/10/2014   CLINICAL DATA:  Right-sided chest pain. Difficulty breathing for 2 days.  EXAM: CHEST  2 VIEW  COMPARISON:  05/21/2013 upper GI examination.  FINDINGS: Cavitary 12.9 cm in diameter right lower lobe lesion with internal air- fluid level. The left lung appears clear. Heart size within normal limits.  IMPRESSION: 1. Cavitary and thick walled right lower lobe lesion with air-fluid level. Differential diagnosis includes cavitary pneumonia such a staphylococcal pneumonia; cavitary malignancy ; or a large pulmonary abscess. It is recommended that the patient proceed to the emergency room and subsequently undergo CT of the chest (with contrast if feasible) for further characterization.  This report is being upgraded to a call report to expedite treatment.   Electronically Signed   By: Sherryl Barters M.D.   On: 08/10/2014 15:53   Ct Chest W Contrast  08/10/2014   CLINICAL DATA:  Cavitary lesion on recent chest x-ray.  EXAM: CT CHEST WITH CONTRAST  TECHNIQUE: Multidetector CT imaging of the chest was performed during intravenous contrast administration.  CONTRAST:  82mL OMNIPAQUE IOHEXOL 300 MG/ML  SOLN  COMPARISON:  Chest x-ray today.  FINDINGS: Lungs are adequately inflated and demonstrate a large uniformly thick-walled cavitary lesion with air-fluid level over the superior segment of the right lower lobe measuring approximately 8 x 10.4 cm in AP and transverse dimensions. There is cystic bronchiectatic change along the anterior superior periphery of this cavitary lesion. There is mild adjacent atelectasis along the superior medial border. No significant effusion. 3-4 mm nodule over the anterior right upper lobe. Left lung is otherwise clear.  Heart is normal size. There is mild mediastinal adenopathy with a 1 cm right peritracheal lymph node and 1.3 cm subcarinal lymph node.  Images through the upper abdomen  demonstrate mild diffuse hepatic steatosis. Remaining bone the soft tissue structures are unremarkable.  IMPRESSION: Large uniformly thick-walled cavitary lesion with air-fluid level over the superior segment of the right lower lobe measuring 8 x 10.4 cm in AP and transverse dimension. Mild adjacent atelectasis and cystic bronchiectatic change. Mild associated mediastinal/ subcarinal adenopathy.  The main differential diagnosis remains cavitary pneumonia/ abscess versus malignancy.  3-4 mm nodule over the right upper lobe. Recommend followup as clinically indicated pending etiology of the above described cavitary process.  These results will be called to the ordering clinician or representative by the Radiologist Assistant, and communication documented in the PACS or zVision Dashboard.   Electronically Signed   By: Marin Olp M.D.   On: 08/10/2014 19:03    Medications:  Scheduled: . enoxaparin (LOVENOX) injection  60 mg Subcutaneous Q24H  . pantoprazole  40 mg Oral Daily  . piperacillin-tazobactam (ZOSYN)  IV  3.375 g Intravenous 3 times per day  . simvastatin  20 mg Oral q1800  . vancomycin  1,250 mg Intravenous Q12H  . vancomycin  2,000 mg Intravenous STAT   Continuous:  EVO:[id number], acetaminophen, ipratropium, levalbuterol, ondansetron (ZOFRAN) IV, ondansetron  Assessment/Plan:  Principal Problem:   Acute respiratory failure with hypoxia Active Problems:   CAP (community acquired pneumonia)   Cavitary lesion of lung   Leukocytosis   PNA (pneumonia)   Chest pain    Acute respiratory failure with hypoxia  Secondary to lung abscess. Etiology is unclear. He is feeling better and seems stable. Continue O2. Check Ra sats when improved.  Lung Abscess/Cavitary Lesion with sepsis On Vanc and Zosyn. Pulmonology following. Continue current antibiotics. Reports travel to [place] in summer. Consult ID. Follow cultures. On airborne precautions.  Chest pain  Likely due to infectious  process in lung. Troponin level is WNL.   DVT Prophylaxis: Enoxaparin    Code Status: Full Code  Family Communication: Discussed with patient and his wife.  Disposition Plan: Not ready for DC. Will likely return home when better. Mobilize.    LOS: 1 day   [facility] Pager [phone] 08/11/2014, 11:49 AM  If 8PM-8AM, please contact night-coverage at [email or web address], password [email or web address]   Disclaimer: This note was dictated with voice recognition software. Similar sounding words can inadvertently be transcribed and may not be corrected upon review.

## 2014-08-11 NOTE — Progress Notes (Signed)
Pt arrived on unit, alert oriented x4. Able to make needs known. In no acute distress. No SOB noted. Rhand and LAC IV site, clean dry and intact. Skin intact as assessed. Vital signs taken and stable. Placed on tele per MD order. We will continue to monitor.

## 2014-08-11 NOTE — Consult Note (Signed)
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  Consider CT neck/mandible Will need f/u imaging.  HIV (-)  Commet- Not clear if this is lung abscess due to ETOH use, GERD or some other more nefarious cause (lung CA?). He states that his R mandibular area has been present for years. Would like to confirm what it is nonetheless.   Thank you so much for this interesting consult,   [name] (pager) [phone] [email or web address]  Christian Hubbard is an 53 y.o. male.  HPI: 53 yo M with hx of GERD, OSA on CPAP, ETOH abuse, comes to hospital on 9-14 with hx of SOB, cough, fever. He was seen by his PCP and had CXR showing lung abscess vs TB. He had Temp 103.1 and was hypoxic (91% on 2L), WBC 16.3. He had a CT showing a 8 x 10 cm thick walled lesions with air-fluid level in RLL, nodule seen RUL. Marland Kitchen  No TB exposure hx, no hx incarceration, no hx homelessness  Was on Vacation in [place] June 2015 for 1` month. Denies drinking til he "passed out".  He was started on vanco/zosyn. He has been evaluated by pulmonary.    Past Medical History  Diagnosis Date  . Hyperlipemia   . GERD (gastroesophageal reflux disease)   . OSA on CPAP   . Pneumonia 08/10/2014    Past Surgical History  Procedure Laterality Date  . No past surgeries       No Known Allergies  Medications:  Scheduled: . enoxaparin (LOVENOX) injection  60 mg Subcutaneous Q24H  . pantoprazole  40 mg Oral Daily  . piperacillin-tazobactam (ZOSYN)  IV  3.375 g Intravenous 3 times per day  . simvastatin  20 mg Oral q1800  . vancomycin  1,250 mg Intravenous Q12H  . vancomycin  2,000 mg Intravenous STAT    Abtx:    Anti-infectives   Start     Dose/Rate Route Frequency Ordered Stop   08/11/14 0800  vancomycin (VANCOCIN) 1,250 mg in sodium chloride 0.9 % 250 mL IVPB     1,250 mg 166.7 mL/hr over 90 Minutes Intravenous Every 12 hours 08/10/14 2033     08/11/14 0400  piperacillin-tazobactam (ZOSYN) IVPB 3.375 g     3.375 g 12.5 mL/hr over 240 Minutes Intravenous 3 times per day 08/10/14 2033     08/10/14 2000  vancomycin (VANCOCIN) 2,000 mg in sodium chloride 0.9 % 500 mL IVPB     2,000 mg 250 mL/hr over 120 Minutes Intravenous STAT 08/10/14 1811 08/11/14 2000   08/10/14 1815  piperacillin-tazobactam (ZOSYN) IVPB 3.375 g     3.375 g 100 mL/hr over 30 Minutes Intravenous STAT 08/10/14 1811 08/10/14 2019      Total days of antibiotics 1 (vanco/zosyn)          Social History:  reports that he quit smoking about 9 years ago. His smoking use included Cigarettes. He has a 24 pack-year smoking history. He has never used smokeless tobacco. He reports that he drinks about 14.4 ounces of alcohol per week. He reports that he does not use illicit drugs.  History reviewed. No pertinent family history.  General ROS: no change in wt, no LAN, no chang in BM  or urine. Had GI illness while in [place]. see HPI.   Blood pressure 142/75, pulse 95, temperature 101 F (38.3 C), temperature source Oral, resp. rate 18, height '6\' 2"'  (1.88 m), weight 129.275 kg (285 lb), SpO2 92.00%. General appearance: alert, cooperative and no distress Eyes: negative findings: conjunctivae and sclerae normal and pupils equal, round, reactive to light and accomodation Throat: lips, mucosa, and tongue normal; teeth and gums normal Neck: supple, symmetrical, trachea midline and enlarged area on R mandible Lungs: diminished breath sounds anterior - right Heart: regular rate and rhythm Abdomen: normal findings: bowel sounds normal and soft, non-tender Extremities: edema none Lymph nodes: Axillary adenopathy: none. He has an enlarged area on  his R mandible.    Results for orders placed during the hospital encounter of 08/10/14 (from the past 48 hour(s))  CBC     Status: Abnormal   Collection Time    08/10/14  5:33 PM      Result Value Ref Range   WBC 16.3 (*) 4.0 - 10.5 K/uL   RBC 4.98  4.22 - 5.81 MIL/uL   Hemoglobin 13.7  13.0 - 17.0 g/dL   HCT 41.2  39.0 - 52.0 %   MCV 82.7  78.0 - 100.0 fL   MCH 27.5  26.0 - 34.0 pg   MCHC 33.3  30.0 - 36.0 g/dL   RDW 13.9  11.5 - 15.5 %   Platelets 301  150 - 400 K/uL  BASIC METABOLIC PANEL     Status: Abnormal   Collection Time    08/10/14  5:33 PM      Result Value Ref Range   Sodium 135 (*) 137 - 147 mEq/L   Potassium 4.1  3.7 - 5.3 mEq/L   Chloride 94 (*) 96 - 112 mEq/L   CO2 23  19 - 32 mEq/L   Glucose, Bld 94  70 - 99 mg/dL   BUN 13  6 - 23 mg/dL   Creatinine, Ser 0.98  0.50 - 1.35 mg/dL   Calcium 9.2  8.4 - 10.5 mg/dL   GFR calc non Af Amer >90  >90 mL/min   GFR calc Af Amer >90  >90 mL/min   Comment: (NOTE)     The eGFR has been calculated using the CKD EPI equation.     This calculation has not been validated in all clinical situations.     eGFR's persistently <90 mL/min signify possible Chronic Kidney     Disease.   Anion gap 18 (*) 5 - 15  I-STAT TROPOININ, ED     Status: Abnormal   Collection Time    08/10/14  5:53 PM      Result Value Ref Range   Troponin i, poc 0.11 (*) 0.00 - 0.08 ng/mL   Comment NOTIFIED PHYSICIAN     Comment 3            Comment: Due to the release kinetics of cTnI,     a negative result within the first hours     of the onset of symptoms does not rule out     myocardial infarction with certainty.     If myocardial infarction is still suspected,     repeat the test at appropriate intervals.  RAPID HIV SCREEN [facility])     Status: None   Collection Time    08/10/14  6:17 PM      Result Value Ref Range   SUDS Rapid HIV Screen NON REACTIVE  NON REACTIVE  CULTURE,  BLOOD (ROUTINE X 2)     Status: None   Collection Time    08/10/14   6:25 PM      Result Value Ref Range   Specimen Description BLOOD ARM LEFT     Special Requests BOTTLES DRAWN AEROBIC AND ANAEROBIC 5CC     Culture  Setup Time       Value: 08/10/2014 22:34     Performed at [facility]   Culture       Value:        BLOOD CULTURE RECEIVED NO GROWTH TO DATE CULTURE WILL BE HELD FOR 5 DAYS BEFORE ISSUING A FINAL NEGATIVE REPORT     Performed at [facility]   Report Status PENDING    TROPONIN I     Status: None   Collection Time    08/10/14  6:26 PM      Result Value Ref Range   Troponin I <0.30  <0.30 ng/mL   Comment:            Due to the release kinetics of cTnI,     a negative result within the first hours     of the onset of symptoms does not rule out     myocardial infarction with certainty.     If myocardial infarction is still suspected,     repeat the test at appropriate intervals.  INFLUENZA PANEL BY PCR (TYPE A & B, H1N1)     Status: None   Collection Time    08/10/14  8:31 PM      Result Value Ref Range   Influenza A By PCR NEGATIVE  NEGATIVE   Influenza B By PCR NEGATIVE  NEGATIVE   H1N1 flu by pcr NOT DETECTED  NOT DETECTED   Comment:            The Xpert Flu assay (FDA approved for     nasal aspirates or washes and     nasopharyngeal swab specimens), is     intended as an aid in the diagnosis of     influenza and should not be used as     a sole basis for treatment.  COMPREHENSIVE METABOLIC PANEL     Status: Abnormal   Collection Time    08/10/14 11:33 PM      Result Value Ref Range   Sodium 137  137 - 147 mEq/L   Potassium 3.5 (*) 3.7 - 5.3 mEq/L   Chloride 97  96 - 112 mEq/L   CO2 25  19 - 32 mEq/L   Glucose, Bld 88  70 - 99 mg/dL   BUN 13  6 - 23 mg/dL   Creatinine, Ser 1.07  0.50 - 1.35 mg/dL   Calcium 8.8  8.4 - 10.5 mg/dL   Total Protein 8.3  6.0 - 8.3 g/dL   Albumin 2.9 (*) 3.5 - 5.2 g/dL   AST 24  0 - 37 U/L   ALT 25  0 - 53 U/L   Alkaline Phosphatase 140 (*) 39 - 117 U/L   Total Bilirubin 0.7   0.3 - 1.2 mg/dL   GFR calc non Af Amer 79 (*) >90 mL/min   GFR calc Af Amer >90  >90 mL/min   Comment: (NOTE)     The eGFR has been calculated using the CKD EPI equation.     This calculation has not been validated in all clinical situations.     eGFR's persistently <90 mL/min signify possible Chronic Kidney  Disease.   Anion gap 15  5 - 15  MAGNESIUM     Status: None   Collection Time    08/10/14 11:33 PM      Result Value Ref Range   Magnesium 2.1  1.5 - 2.5 mg/dL  PHOSPHORUS     Status: None   Collection Time    08/10/14 11:33 PM      Result Value Ref Range   Phosphorus 3.7  2.3 - 4.6 mg/dL  CBC WITH DIFFERENTIAL     Status: Abnormal   Collection Time    08/10/14 11:33 PM      Result Value Ref Range   WBC 18.2 (*) 4.0 - 10.5 K/uL   RBC 4.88  4.22 - 5.81 MIL/uL   Hemoglobin 13.2  13.0 - 17.0 g/dL   HCT 39.9  39.0 - 52.0 %   MCV 81.8  78.0 - 100.0 fL   MCH 27.0  26.0 - 34.0 pg   MCHC 33.1  30.0 - 36.0 g/dL   RDW 14.0  11.5 - 15.5 %   Platelets 309  150 - 400 K/uL   Neutrophils Relative % 83 (*) 43 - 77 %   Lymphocytes Relative 9 (*) 12 - 46 %   Monocytes Relative 7  3 - 12 %   Eosinophils Relative 1  0 - 5 %   Basophils Relative 0  0 - 1 %   Neutro Abs 15.1 (*) 1.7 - 7.7 K/uL   Lymphs Abs 1.6  0.7 - 4.0 K/uL   Monocytes Absolute 1.3 (*) 0.1 - 1.0 K/uL   Eosinophils Absolute 0.2  0.0 - 0.7 K/uL   Basophils Absolute 0.0  0.0 - 0.1 K/uL   Smear Review MORPHOLOGY UNREMARKABLE    APTT     Status: None   Collection Time    08/10/14 11:33 PM      Result Value Ref Range   aPTT 33  24 - 37 seconds  PROTIME-INR     Status: Abnormal   Collection Time    08/10/14 11:33 PM      Result Value Ref Range   Prothrombin Time 15.8 (*) 11.6 - 15.2 seconds   INR 1.26  0.00 - 1.49  TSH     Status: None   Collection Time    08/10/14 11:33 PM      Result Value Ref Range   TSH 3.640  0.350 - 4.500 uIU/mL  URINALYSIS, ROUTINE W REFLEX MICROSCOPIC     Status: Abnormal   Collection  Time    08/10/14 11:50 PM      Result Value Ref Range   Color, Urine AMBER (*) YELLOW   Comment: BIOCHEMICALS MAY BE AFFECTED BY COLOR   APPearance CLEAR  CLEAR   Specific Gravity, Urine 1.010  1.005 - 1.030   pH 5.5  5.0 - 8.0   Glucose, UA NEGATIVE  NEGATIVE mg/dL   Hgb urine dipstick MODERATE (*) NEGATIVE   Bilirubin Urine MODERATE (*) NEGATIVE   Ketones, ur 40 (*) NEGATIVE mg/dL   Protein, ur 100 (*) NEGATIVE mg/dL   Urobilinogen, UA 1.0  0.0 - 1.0 mg/dL   Nitrite NEGATIVE  NEGATIVE   Leukocytes, UA NEGATIVE  NEGATIVE  URINE MICROSCOPIC-ADD ON     Status: Abnormal   Collection Time    08/10/14 11:50 PM      Result Value Ref Range   WBC, UA 0-2  <3 WBC/hpf   RBC / HPF 3-6  <3 RBC/hpf  Bacteria, UA RARE  RARE   Casts HYALINE CASTS (*) NEGATIVE   Urine-Other MUCOUS PRESENT    LEGIONELLA ANTIGEN, URINE     Status: None   Collection Time    08/10/14 11:55 PM      Result Value Ref Range   Specimen Description URINE, CLEAN CATCH     Special Requests NONE     Legionella Antigen, Urine       Value: Negative for Legionella pneumophilia serogroup 1     Performed at [facility]   Report Status 08/11/2014 FINAL    STREP PNEUMONIAE URINARY ANTIGEN     Status: None   Collection Time    08/10/14 11:55 PM      Result Value Ref Range   Strep Pneumo Urinary Antigen NEGATIVE  NEGATIVE   Comment:            Infection due to S. pneumoniae     cannot be absolutely ruled out     since the antigen present     may be below the detection limit     of the test.  GRAM STAIN     Status: None   Collection Time    08/11/14  4:42 AM      Result Value Ref Range   Specimen Description SPUTUM     Special Requests NONE     Gram Stain       Value: ABUNDANT WBC PRESENT,BOTH PMN AND MONONUCLEAR     FEW GRAM POSITIVE RODS     FEW GRAM NEGATIVE RODS     FEW GRAM POSITIVE COCCI IN PAIRS     Results Called toTish Frederickson [phone] [place]   Report Status 08/11/2014 FINAL    CULTURE,  EXPECTORATED SPUTUM-ASSESSMENT     Status: None   Collection Time    08/11/14  4:42 AM      Result Value Ref Range   Specimen Description SPUTUM     Special Requests NONE     Sputum evaluation       Value: MICROSCOPIC FINDINGS SUGGEST THAT THIS SPECIMEN IS NOT REPRESENTATIVE OF LOWER RESPIRATORY SECRETIONS. PLEASE RECOLLECT.     Results Called toTish Frederickson [phone] [place]   Report Status 08/11/2014 FINAL    COMPREHENSIVE METABOLIC PANEL     Status: Abnormal   Collection Time    08/11/14  6:35 AM      Result Value Ref Range   Sodium 136 (*) 137 - 147 mEq/L   Potassium 4.3  3.7 - 5.3 mEq/L   Chloride 99  96 - 112 mEq/L   CO2 23  19 - 32 mEq/L   Glucose, Bld 102 (*) 70 - 99 mg/dL   BUN 11  6 - 23 mg/dL   Creatinine, Ser 0.97  0.50 - 1.35 mg/dL   Calcium 8.7  8.4 - 10.5 mg/dL   Total Protein 7.6  6.0 - 8.3 g/dL   Albumin 2.6 (*) 3.5 - 5.2 g/dL   AST 26  0 - 37 U/L   ALT 25  0 - 53 U/L   Alkaline Phosphatase 130 (*) 39 - 117 U/L   Total Bilirubin 0.6  0.3 - 1.2 mg/dL   GFR calc non Af Amer >90  >90 mL/min   GFR calc Af Amer >90  >90 mL/min   Comment: (NOTE)     The eGFR has been calculated using the CKD EPI equation.     This calculation has not been validated in all  clinical situations.     eGFR's persistently <90 mL/min signify possible Chronic Kidney     Disease.   Anion gap 14  5 - 15  CBC     Status: Abnormal   Collection Time    08/11/14  6:35 AM      Result Value Ref Range   WBC 15.1 (*) 4.0 - 10.5 K/uL   RBC 4.59  4.22 - 5.81 MIL/uL   Hemoglobin 12.5 (*) 13.0 - 17.0 g/dL   HCT 37.0 (*) 39.0 - 52.0 %   MCV 80.6  78.0 - 100.0 fL   MCH 27.2  26.0 - 34.0 pg   MCHC 33.8  30.0 - 36.0 g/dL   RDW 14.0  11.5 - 15.5 %   Platelets 302  150 - 400 K/uL      Component Value Date/Time   SDES SPUTUM 08/11/2014 0442   SDES SPUTUM 08/11/2014 0442   SPECREQUEST NONE 08/11/2014 0442   SPECREQUEST NONE 08/11/2014 0442   CULT  Value:        BLOOD CULTURE RECEIVED NO GROWTH TO  DATE CULTURE WILL BE HELD FOR 5 DAYS BEFORE ISSUING A FINAL NEGATIVE REPORT Performed at [facility] 08/10/2014 1825   REPTSTATUS 08/11/2014 FINAL 08/11/2014 0442   REPTSTATUS 08/11/2014 FINAL 08/11/2014 0442   Dg Chest 2 View  08/10/2014   CLINICAL DATA:  Right-sided chest pain. Difficulty breathing for 2 days.  EXAM: CHEST  2 VIEW  COMPARISON:  05/21/2013 upper GI examination.  FINDINGS: Cavitary 12.9 cm in diameter right lower lobe lesion with internal air- fluid level. The left lung appears clear. Heart size within normal limits.  IMPRESSION: 1. Cavitary and thick walled right lower lobe lesion with air-fluid level. Differential diagnosis includes cavitary pneumonia such a staphylococcal pneumonia; cavitary malignancy ; or a large pulmonary abscess. It is recommended that the patient proceed to the emergency room and subsequently undergo CT of the chest (with contrast if feasible) for further characterization.  This report is being upgraded to a call report to expedite treatment.   Electronically Signed   By: Sherryl Barters M.D.   On: 08/10/2014 15:53   Ct Chest W Contrast  08/10/2014   CLINICAL DATA:  Cavitary lesion on recent chest x-ray.  EXAM: CT CHEST WITH CONTRAST  TECHNIQUE: Multidetector CT imaging of the chest was performed during intravenous contrast administration.  CONTRAST:  47m OMNIPAQUE IOHEXOL 300 MG/ML  SOLN  COMPARISON:  Chest x-ray today.  FINDINGS: Lungs are adequately inflated and demonstrate a large uniformly thick-walled cavitary lesion with air-fluid level over the superior segment of the right lower lobe measuring approximately 8 x 10.4 cm in AP and transverse dimensions. There is cystic bronchiectatic change along the anterior superior periphery of this cavitary lesion. There is mild adjacent atelectasis along the superior medial border. No significant effusion. 3-4 mm nodule over the anterior right upper lobe. Left lung is otherwise clear.  Heart is normal size. There  is mild mediastinal adenopathy with a 1 cm right peritracheal lymph node and 1.3 cm subcarinal lymph node.  Images through the upper abdomen demonstrate mild diffuse hepatic steatosis. Remaining bone the soft tissue structures are unremarkable.  IMPRESSION: Large uniformly thick-walled cavitary lesion with air-fluid level over the superior segment of the right lower lobe measuring 8 x 10.4 cm in AP and transverse dimension. Mild adjacent atelectasis and cystic bronchiectatic change. Mild associated mediastinal/ subcarinal adenopathy. The main differential diagnosis remains cavitary pneumonia/ abscess versus malignancy.  3-4 mm nodule  over the right upper lobe. Recommend followup as clinically indicated pending etiology of the above described cavitary process.  These results will be called to the ordering clinician or representative by the Radiologist Assistant, and communication documented in the PACS or zVision Dashboard.   Electronically Signed   By: Marin Olp M.D.   On: 08/10/2014 19:03   Recent Results (from the past 240 hour(s))  CULTURE, BLOOD (ROUTINE X 2)     Status: None   Collection Time    08/10/14  6:25 PM      Result Value Ref Range Status   Specimen Description BLOOD ARM LEFT   Final   Special Requests BOTTLES DRAWN AEROBIC AND ANAEROBIC 5CC   Final   Culture  Setup Time     Final   Value: 08/10/2014 22:34     Performed at [facility]   Culture     Final   Value:        BLOOD CULTURE RECEIVED NO GROWTH TO DATE CULTURE WILL BE HELD FOR 5 DAYS BEFORE ISSUING A FINAL NEGATIVE REPORT     Performed at [facility]   Report Status PENDING   Incomplete  GRAM STAIN     Status: None   Collection Time    08/11/14  4:42 AM      Result Value Ref Range Status   Specimen Description SPUTUM   Final   Special Requests NONE   Final   Gram Stain     Final   Value: ABUNDANT WBC PRESENT,BOTH PMN AND MONONUCLEAR     FEW GRAM POSITIVE RODS     FEW GRAM NEGATIVE RODS     FEW  GRAM POSITIVE COCCI IN PAIRS     Results Called toTish Frederickson [phone] [place]   Report Status 08/11/2014 FINAL   Final  CULTURE, EXPECTORATED SPUTUM-ASSESSMENT     Status: None   Collection Time    08/11/14  4:42 AM      Result Value Ref Range Status   Specimen Description SPUTUM   Final   Special Requests NONE   Final   Sputum evaluation     Final   Value: MICROSCOPIC FINDINGS SUGGEST THAT THIS SPECIMEN IS NOT REPRESENTATIVE OF LOWER RESPIRATORY SECRETIONS. PLEASE RECOLLECT.     Results Called toTish Frederickson [phone] [place]   Report Status 08/11/2014 FINAL   Final      08/11/2014, 3:26 PM     LOS: 1 day

## 2014-08-11 NOTE — Progress Notes (Addendum)
Given pt report to [facility]

## 2014-08-12 ENCOUNTER — Encounter (HOSPITAL_COMMUNITY): Payer: Self-pay

## 2014-08-12 ENCOUNTER — Inpatient Hospital Stay (HOSPITAL_COMMUNITY): Payer: 59

## 2014-08-12 DIAGNOSIS — R0902 Hypoxemia: Secondary | ICD-10-CM

## 2014-08-12 LAB — COMPREHENSIVE METABOLIC PANEL
ALT: 25 U/L (ref 0–53)
ANION GAP: 16 — AB (ref 5–15)
AST: 23 U/L (ref 0–37)
Albumin: 2.5 g/dL — ABNORMAL LOW (ref 3.5–5.2)
Alkaline Phosphatase: 165 U/L — ABNORMAL HIGH (ref 39–117)
BUN: 10 mg/dL (ref 6–23)
CALCIUM: 8.7 mg/dL (ref 8.4–10.5)
CO2: 24 mEq/L (ref 19–32)
Chloride: 102 mEq/L (ref 96–112)
Creatinine, Ser: 1.06 mg/dL (ref 0.50–1.35)
GFR calc Af Amer: >90 mL/min (ref >90)
GFR calc non Af Amer: 80 mL/min — ABNORMAL LOW (ref >90)
GLUCOSE: 107 mg/dL — AB (ref 70–99)
Potassium: 3.9 mEq/L (ref 3.7–5.3)
SODIUM: 142 meq/L (ref 137–147)
TOTAL PROTEIN: 7.6 g/dL (ref 6.0–8.3)
Total Bilirubin: 0.6 mg/dL (ref 0.3–1.2)

## 2014-08-12 LAB — CBC
HCT: 39.1 % (ref 39.0–52.0)
HEMOGLOBIN: 12.5 g/dL — AB (ref 13.0–17.0)
MCH: 26.3 pg (ref 26.0–34.0)
MCHC: 32 g/dL (ref 30.0–36.0)
MCV: 82.3 fL (ref 78.0–100.0)
PLATELETS: 294 10*3/uL (ref 150–400)
RBC: 4.75 MIL/uL (ref 4.22–5.81)
RDW: 14.3 % (ref 11.5–15.5)
WBC: 16.2 10*3/uL — ABNORMAL HIGH (ref 4.0–10.5)

## 2014-08-12 LAB — EXPECTORATED SPUTUM ASSESSMENT W REFEX TO RESP CULTURE

## 2014-08-12 LAB — URINE CULTURE
Colony Count: NO GROWTH
Culture: NO GROWTH

## 2014-08-12 LAB — EXPECTORATED SPUTUM ASSESSMENT W GRAM STAIN, RFLX TO RESP C

## 2014-08-12 LAB — GLUCOSE, CAPILLARY: GLUCOSE-CAPILLARY: 102 mg/dL — AB (ref 70–99)

## 2014-08-12 LAB — HIV ANTIBODY (ROUTINE TESTING W REFLEX): HIV 1&2 Ab, 4th Generation: NONREACTIVE

## 2014-08-12 MED ORDER — IOHEXOL 300 MG/ML  SOLN
75.0000 mL | Freq: Once | INTRAMUSCULAR | Status: AC | PRN
  Administered 2014-08-12: 75 mL via INTRAVENOUS

## 2014-08-12 MED ORDER — IBUPROFEN 100 MG PO CHEW
200.0000 mg | CHEWABLE_TABLET | Freq: Three times a day (TID) | ORAL | Status: DC | PRN
  Administered 2014-08-12: 200 mg via ORAL
  Filled 2014-08-12 (×2): qty 2

## 2014-08-12 NOTE — Progress Notes (Signed)
INFECTIOUS DISEASE PROGRESS NOTE  ID: Christian Hubbard is a 53 y.o. male with  Principal Problem:   Acute respiratory failure with hypoxia Active Problems:   CAP (community acquired pneumonia)   Cavitary lesion of lung   Leukocytosis   PNA (pneumonia)   Chest pain  Subjective: Producing more sputum.   Abtx:  Anti-infectives   Start     Dose/Rate Route Frequency Ordered Stop   08/11/14 0800  vancomycin (VANCOCIN) 1,250 mg in sodium chloride 0.9 % 250 mL IVPB     1,250 mg 166.7 mL/hr over 90 Minutes Intravenous Every 12 hours 08/10/14 2033     08/11/14 0400  piperacillin-tazobactam (ZOSYN) IVPB 3.375 g     3.375 g 12.5 mL/hr over 240 Minutes Intravenous 3 times per day 08/10/14 2033     08/10/14 2000  vancomycin (VANCOCIN) 2,000 mg in sodium chloride 0.9 % 500 mL IVPB     2,000 mg 250 mL/hr over 120 Minutes Intravenous STAT 08/10/14 1811 08/11/14 2000   08/10/14 1815  piperacillin-tazobactam (ZOSYN) IVPB 3.375 g     3.375 g 100 mL/hr over 30 Minutes Intravenous STAT 08/10/14 1811 08/10/14 2019      Medications:  Scheduled: . enoxaparin (LOVENOX) injection  60 mg Subcutaneous Q24H  . pantoprazole  40 mg Oral Daily  . piperacillin-tazobactam (ZOSYN)  IV  3.375 g Intravenous 3 times per day  . simvastatin  20 mg Oral q1800  . vancomycin  1,250 mg Intravenous Q12H    Objective: Vital signs in last 24 hours: Temp:  [98.6 F (37 C)-102.2 F (39 C)] 100.6 F (38.1 C) (09/16 1327) Pulse Rate:  [70-101] 89 (09/16 1327) Resp:  [18] 18 (09/16 1327) BP: (123-152)/(75-84) 152/84 mmHg (09/16 1327) SpO2:  [93 %-96 %] 95 % (09/16 1327) Weight:  [131.3 kg (289 lb 7.4 oz)] 131.3 kg (289 lb 7.4 oz) (09/16 0550)   General appearance: alert, cooperative and no distress Resp: diminished breath sounds bilaterally Cardio: regular rate and rhythm GI: normal findings: bowel sounds normal and soft, non-tender  Lab Results  Recent Labs  08/11/14 0635 08/12/14 0350  WBC 15.1*  16.2*  HGB 12.5* 12.5*  HCT 37.0* 39.1  NA 136* 142  K 4.3 3.9  CL 99 102  CO2 23 24  BUN 11 10  CREATININE 0.97 1.06   Liver Panel  Recent Labs  08/11/14 0635 08/12/14 0350  PROT 7.6 7.6  ALBUMIN 2.6* 2.5*  AST 26 23  ALT 25 25  ALKPHOS 130* 165*  BILITOT 0.6 0.6   Sedimentation Rate No results found for this basename: ESRSEDRATE,  in the last 72 hours C-Reactive Protein No results found for this basename: CRP,  in the last 72 hours  Microbiology: Recent Results (from the past 240 hour(s))  CULTURE, BLOOD (ROUTINE X 2)     Status: None   Collection Time    08/10/14  6:25 PM      Result Value Ref Range Status   Specimen Description BLOOD ARM LEFT   Final   Special Requests BOTTLES DRAWN AEROBIC AND ANAEROBIC 5CC   Final   Culture  Setup Time     Final   Value: 08/10/2014 22:34     Performed at [facility]   Culture     Final   Value:        BLOOD CULTURE RECEIVED NO GROWTH TO DATE CULTURE WILL BE HELD FOR 5 DAYS BEFORE ISSUING A FINAL NEGATIVE REPORT     Performed  at [facility]   Report Status PENDING   Incomplete  CULTURE, BLOOD (ROUTINE X 2)     Status: None   Collection Time    08/10/14  8:33 PM      Result Value Ref Range Status   Specimen Description BLOOD LEFT WRIST   Final   Special Requests BOTTLES DRAWN AEROBIC AND ANAEROBIC 5CC   Final   Culture  Setup Time     Final   Value: 08/11/2014 04:35     Performed at [facility]   Culture     Final   Value:        BLOOD CULTURE RECEIVED NO GROWTH TO DATE CULTURE WILL BE HELD FOR 5 DAYS BEFORE ISSUING A FINAL NEGATIVE REPORT     Performed at [facility]   Report Status PENDING   Incomplete  URINE CULTURE     Status: None   Collection Time    08/10/14 11:50 PM      Result Value Ref Range Status   Specimen Description URINE, CLEAN CATCH   Final   Special Requests NONE   Final   Culture  Setup Time     Final   Value: 08/11/2014 00:23     Performed at [facility]     Final   Value: NO GROWTH     Performed at [facility]   Culture     Final   Value: NO GROWTH     Performed at [facility]   Report Status 08/12/2014 FINAL   Final  GRAM STAIN     Status: None   Collection Time    08/11/14  4:42 AM      Result Value Ref Range Status   Specimen Description SPUTUM   Final   Special Requests NONE   Final   Gram Stain     Final   Value: ABUNDANT WBC PRESENT,BOTH PMN AND MONONUCLEAR     FEW GRAM POSITIVE RODS     FEW GRAM NEGATIVE RODS     FEW GRAM POSITIVE COCCI IN PAIRS     Results Called toTish Frederickson [phone]   Report Status 08/11/2014 FINAL   Final  CULTURE, EXPECTORATED SPUTUM-ASSESSMENT     Status: None   Collection Time    08/11/14  4:42 AM      Result Value Ref Range Status   Specimen Description SPUTUM   Final   Special Requests NONE   Final   Sputum evaluation     Final   Value: MICROSCOPIC FINDINGS SUGGEST THAT THIS SPECIMEN IS NOT REPRESENTATIVE OF LOWER RESPIRATORY SECRETIONS. PLEASE RECOLLECT.     Results Called toTish Frederickson [id number] [place]   Report Status 08/11/2014 FINAL   Final  AFB CULTURE WITH SMEAR     Status: None   Collection Time    08/11/14  4:42 AM      Result Value Ref Range Status   Specimen Description SPUTUM   Final   Special Requests NONE   Final   Acid Fast Smear     Final   Value: NO ACID FAST BACILLI SEEN     Performed at [facility]   Culture     Final   Value: CULTURE WILL BE EXAMINED FOR 6 WEEKS BEFORE ISSUING A FINAL REPORT     Performed at [facility]   Report Status PENDING   Incomplete  CULTURE, EXPECTORATED SPUTUM-ASSESSMENT  Status: None   Collection Time    08/11/14  3:40 PM      Result Value Ref Range Status   Specimen Description SPUTUM   Final   Special Requests Normal   Final   Sputum evaluation     Final   Value: MICROSCOPIC FINDINGS SUGGEST THAT THIS SPECIMEN IS NOT REPRESENTATIVE OF LOWER RESPIRATORY  SECRETIONS. PLEASE RECOLLECT.     CALLED TO S.TUTTLE ,RN AT [place] 08/11/14   Report Status 08/11/2014 FINAL   Final  CULTURE, EXPECTORATED SPUTUM-ASSESSMENT     Status: None   Collection Time    08/12/14  1:07 PM      Result Value Ref Range Status   Specimen Description SPUTUM   Final   Special Requests NONE   Final   Sputum evaluation     Final   Value: THIS SPECIMEN IS ACCEPTABLE. RESPIRATORY CULTURE REPORT TO FOLLOW.   Report Status 08/12/2014 FINAL   Final    Studies/Results: Ct Chest W Contrast  08/10/2014   CLINICAL DATA:  Cavitary lesion on recent chest x-ray.  EXAM: CT CHEST WITH CONTRAST  TECHNIQUE: Multidetector CT imaging of the chest was performed during intravenous contrast administration.  CONTRAST:  64mL OMNIPAQUE IOHEXOL 300 MG/ML  SOLN  COMPARISON:  Chest x-ray today.  FINDINGS: Lungs are adequately inflated and demonstrate a large uniformly thick-walled cavitary lesion with air-fluid level over the superior segment of the right lower lobe measuring approximately 8 x 10.4 cm in AP and transverse dimensions. There is cystic bronchiectatic change along the anterior superior periphery of this cavitary lesion. There is mild adjacent atelectasis along the superior medial border. No significant effusion. 3-4 mm nodule over the anterior right upper lobe. Left lung is otherwise clear.  Heart is normal size. There is mild mediastinal adenopathy with a 1 cm right peritracheal lymph node and 1.3 cm subcarinal lymph node.  Images through the upper abdomen demonstrate mild diffuse hepatic steatosis. Remaining bone the soft tissue structures are unremarkable.  IMPRESSION: Large uniformly thick-walled cavitary lesion with air-fluid level over the superior segment of the right lower lobe measuring 8 x 10.4 cm in AP and transverse dimension. Mild adjacent atelectasis and cystic bronchiectatic change. Mild associated mediastinal/ subcarinal adenopathy. The main differential diagnosis remains  cavitary pneumonia/ abscess versus malignancy.  3-4 mm nodule over the right upper lobe. Recommend followup as clinically indicated pending etiology of the above described cavitary process.  These results will be called to the ordering clinician or representative by the Radiologist Assistant, and communication documented in the PACS or zVision Dashboard.   Electronically Signed   By: Marin Olp M.D.   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 WBC roughly unchanged. Await CT neck (done today) Has 1/3 acceptable sputum. Would await further studies before making decisions on stopping isolation. resending now.  No change in anbx for now.          [name] Infectious Diseases (pager) [phone] [email or web address] 08/12/2014, 4:19 PM  LOS: 2 days

## 2014-08-12 NOTE — Progress Notes (Signed)
[facility] PROGRESS NOTE  Christian Hubbard EXH:[id number] DOB: [birth year] DOA: 08/10/2014 PCP: Vena Austria, MD  Assessment/Plan: Principal Problem:   Acute respiratory failure with hypoxia Active Problems:   CAP (community acquired pneumonia)   Cavitary lesion of lung   Leukocytosis   PNA (pneumonia)   Chest pain    Acute respiratory failure with hypoxia  Secondary to lung abscess , recollect sputum. CT scan of the neck and the mandible as recommended by infectious disease will be done today. Continue vancomycin and Zosyn He is feeling better and seems stable. Continue O2. Check Ra sats when improved.  Await AFB's  Lung Abscess/Cavitary Lesion with sepsis  On Vanc and Zosyn. Pulmonology following. Continue current antibiotics. Reports travel to [place] in summer. Consult ID. Follow cultures. On airborne precautions.   Chest pain  Likely due to infectious process in lung. Troponin level is WNL   Code Status: full Family Communication: family updated about patient's clinical progress Disposition Plan:  As above    Brief narrative: 53 year old male with no significant past medical history who presented to [facility] ED 08/10/2014 with complaints of cough, shortness of breath, fever for past few days prior to this admission. He was seen by PCP prior to coming to ED and his CXR showed possible lung abscess or TB and he was told to go to ED for further evaluation. Pt reported having some night sweats but no weight loss. He had some right chest pain, non radiating, 5/10 in intensity which was present at rest, not alleviated with aleve. Of note, he was in [place] for vacation this summer but did not report having these symptoms when he arrived home.  In ED, BP was 146/84, HR 107-125, RR 23, T max 103.1 F. Oxygen saturation was 91% with Hart oxygen support at 2 L. WBC count was 16.3 otherwise unremarkable. The troponin level was WNL. The 12 lead EKG showed sinus tachycardia. CXR  showed cavitary and thick walled right lower lobe lesion with air-fluid level. CT chest showed large uniformly thick-walled cavitary lesion with air-fluid level over the superior segment of the right lower lobe measuring 8 x 10.4 cm, some cystic bronchiectatic changes, mediastinal/ subcarinal adenopathy and 3-4 mm nodule over the right upper lobe.  cavitary process   Consultants:  Infectious disease  Procedures:  None  Antibiotics:  Zosyn, vancomycin  HPI/Subjective: No complaints, feeling better  Objective: Filed Vitals:   08/11/14 2309 08/12/14 0550 08/12/14 0610 08/12/14 1046  BP:  123/78    Pulse:  70    Temp: 100 F (37.8 C) 100.2 F (37.9 C) 98.6 F (37 C) 99.1 F (37.3 C)  TempSrc:  Oral  Oral  Resp:  18    Height:      Weight:  131.3 kg (289 lb 7.4 oz)    SpO2:  96%      Intake/Output Summary (Last 24 hours) at 08/12/14 1212 Last data filed at 08/12/14 3810  Gross per 24 hour  Intake    480 ml  Output      4 ml  Net    476 ml    Exam:  General: alert & oriented x 3 In NAD  Cardiovascular: RRR, nl S1 s2  Respiratory: Decreased breath sounds at the bases, scattered rhonchi, no crackles  Abdomen: soft +BS NT/ND, no masses palpable  Extremities: No cyanosis and no edema      Data Reviewed: Basic Metabolic Panel:  Recent Labs Lab 08/10/14 1733 08/10/14 2333 08/11/14 0635 08/12/14  0350  NA 135* 137 136* 142  K 4.1 3.5* 4.3 3.9  CL 94* 97 99 102  CO2 23 25 23 24   GLUCOSE 94 88 102* 107*  BUN 13 13 11 10   CREATININE 0.98 1.07 0.97 1.06  CALCIUM 9.2 8.8 8.7 8.7  MG  --  2.1  --   --   PHOS  --  3.7  --   --     Liver Function Tests:  Recent Labs Lab 08/10/14 2333 08/11/14 0635 08/12/14 0350  AST 24 26 23   ALT 25 25 25   ALKPHOS 140* 130* 165*  BILITOT 0.7 0.6 0.6  PROT 8.3 7.6 7.6  ALBUMIN 2.9* 2.6* 2.5*   No results found for this basename: LIPASE, AMYLASE,  in the last 168 hours No results found for this basename: AMMONIA,   in the last 168 hours  CBC:  Recent Labs Lab 08/10/14 1733 08/10/14 2333 08/11/14 0635 08/12/14 0350  WBC 16.3* 18.2* 15.1* 16.2*  NEUTROABS  --  15.1*  --   --   HGB 13.7 13.2 12.5* 12.5*  HCT 41.2 39.9 37.0* 39.1  MCV 82.7 81.8 80.6 82.3  PLT 301 309 302 294    Cardiac Enzymes:  Recent Labs Lab 08/10/14 1826  TROPONINI <0.30   BNP (last 3 results) No results found for this basename: PROBNP,  in the last 8760 hours   CBG:  Recent Labs Lab 08/12/14 0733  GLUCAP 102*    Recent Results (from the past 240 hour(s))  CULTURE, BLOOD (ROUTINE X 2)     Status: None   Collection Time    08/10/14  6:25 PM      Result Value Ref Range Status   Specimen Description BLOOD ARM LEFT   Final   Special Requests BOTTLES DRAWN AEROBIC AND ANAEROBIC 5CC   Final   Culture  Setup Time     Final   Value: 08/10/2014 22:34     Performed at [facility]   Culture     Final   Value:        BLOOD CULTURE RECEIVED NO GROWTH TO DATE CULTURE WILL BE HELD FOR 5 DAYS BEFORE ISSUING A FINAL NEGATIVE REPORT     Performed at [facility]   Report Status PENDING   Incomplete  CULTURE, BLOOD (ROUTINE X 2)     Status: None   Collection Time    08/10/14  8:33 PM      Result Value Ref Range Status   Specimen Description BLOOD LEFT WRIST   Final   Special Requests BOTTLES DRAWN AEROBIC AND ANAEROBIC 5CC   Final   Culture  Setup Time     Final   Value: 08/11/2014 04:35     Performed at [facility]   Culture     Final   Value:        BLOOD CULTURE RECEIVED NO GROWTH TO DATE CULTURE WILL BE HELD FOR 5 DAYS BEFORE ISSUING A FINAL NEGATIVE REPORT     Performed at [facility]   Report Status PENDING   Incomplete  URINE CULTURE     Status: None   Collection Time    08/10/14 11:50 PM      Result Value Ref Range Status   Specimen Description URINE, CLEAN CATCH   Final   Special Requests NONE   Final   Culture  Setup Time     Final   Value: 08/11/2014 00:23  Performed at [facility]     Final   Value: NO GROWTH     Performed at [facility]   Culture     Final   Value: NO GROWTH     Performed at [facility]   Report Status 08/12/2014 FINAL   Final  GRAM STAIN     Status: None   Collection Time    08/11/14  4:42 AM      Result Value Ref Range Status   Specimen Description SPUTUM   Final   Special Requests NONE   Final   Gram Stain     Final   Value: ABUNDANT WBC PRESENT,BOTH PMN AND MONONUCLEAR     FEW GRAM POSITIVE RODS     FEW GRAM NEGATIVE RODS     FEW GRAM POSITIVE COCCI IN PAIRS     Results Called toTish Frederickson [id number] [place]   Report Status 08/11/2014 FINAL   Final  CULTURE, EXPECTORATED SPUTUM-ASSESSMENT     Status: None   Collection Time    08/11/14  4:42 AM      Result Value Ref Range Status   Specimen Description SPUTUM   Final   Special Requests NONE   Final   Sputum evaluation     Final   Value: MICROSCOPIC FINDINGS SUGGEST THAT THIS SPECIMEN IS NOT REPRESENTATIVE OF LOWER RESPIRATORY SECRETIONS. PLEASE RECOLLECT.     Results Called toTish Frederickson [phone] [place]   Report Status 08/11/2014 FINAL   Final  CULTURE, EXPECTORATED SPUTUM-ASSESSMENT     Status: None   Collection Time    08/11/14  3:40 PM      Result Value Ref Range Status   Specimen Description SPUTUM   Final   Special Requests Normal   Final   Sputum evaluation     Final   Value: MICROSCOPIC FINDINGS SUGGEST THAT THIS SPECIMEN IS NOT REPRESENTATIVE OF LOWER RESPIRATORY SECRETIONS. PLEASE RECOLLECT.     CALLED TO S.TUTTLE ,RN AT [place] 08/11/14   Report Status 08/11/2014 FINAL   Final     Studies: Dg Chest 2 View  08/10/2014   CLINICAL DATA:  Right-sided chest pain. Difficulty breathing for 2 days.  EXAM: CHEST  2 VIEW  COMPARISON:  05/21/2013 upper GI examination.  FINDINGS: Cavitary 12.9 cm in diameter right lower lobe lesion with internal air- fluid level. The left lung appears clear.  Heart size within normal limits.  IMPRESSION: 1. Cavitary and thick walled right lower lobe lesion with air-fluid level. Differential diagnosis includes cavitary pneumonia such a staphylococcal pneumonia; cavitary malignancy ; or a large pulmonary abscess. It is recommended that the patient proceed to the emergency room and subsequently undergo CT of the chest (with contrast if feasible) for further characterization.  This report is being upgraded to a call report to expedite treatment.   Electronically Signed   By: Sherryl Barters M.D.   On: 08/10/2014 15:53   Ct Chest W Contrast  08/10/2014   CLINICAL DATA:  Cavitary lesion on recent chest x-ray.  EXAM: CT CHEST WITH CONTRAST  TECHNIQUE: Multidetector CT imaging of the chest was performed during intravenous contrast administration.  CONTRAST:  68mL OMNIPAQUE IOHEXOL 300 MG/ML  SOLN  COMPARISON:  Chest x-ray today.  FINDINGS: Lungs are adequately inflated and demonstrate a large uniformly thick-walled cavitary lesion with air-fluid level over the superior segment of the right lower lobe measuring approximately 8 x 10.4 cm in AP and  transverse dimensions. There is cystic bronchiectatic change along the anterior superior periphery of this cavitary lesion. There is mild adjacent atelectasis along the superior medial border. No significant effusion. 3-4 mm nodule over the anterior right upper lobe. Left lung is otherwise clear.  Heart is normal size. There is mild mediastinal adenopathy with a 1 cm right peritracheal lymph node and 1.3 cm subcarinal lymph node.  Images through the upper abdomen demonstrate mild diffuse hepatic steatosis. Remaining bone the soft tissue structures are unremarkable.  IMPRESSION: Large uniformly thick-walled cavitary lesion with air-fluid level over the superior segment of the right lower lobe measuring 8 x 10.4 cm in AP and transverse dimension. Mild adjacent atelectasis and cystic bronchiectatic change. Mild associated mediastinal/  subcarinal adenopathy. The main differential diagnosis remains cavitary pneumonia/ abscess versus malignancy.  3-4 mm nodule over the right upper lobe. Recommend followup as clinically indicated pending etiology of the above described cavitary process.  These results will be called to the ordering clinician or representative by the Radiologist Assistant, and communication documented in the PACS or zVision Dashboard.   Electronically Signed   By: Marin Olp M.D.   On: 08/10/2014 19:03    Scheduled Meds: . enoxaparin (LOVENOX) injection  60 mg Subcutaneous Q24H  . pantoprazole  40 mg Oral Daily  . piperacillin-tazobactam (ZOSYN)  IV  3.375 g Intravenous 3 times per day  . simvastatin  20 mg Oral q1800  . vancomycin  1,250 mg Intravenous Q12H   Continuous Infusions:   Principal Problem:   Acute respiratory failure with hypoxia Active Problems:   CAP (community acquired pneumonia)   Cavitary lesion of lung   Leukocytosis   PNA (pneumonia)   Chest pain    Time spent: 40 minutes   [facility] Pager [phone]. If 7PM-7AM, please contact night-coverage at [email or web address], password [facility] 08/12/2014, 12:12 PM  LOS: 2 days

## 2014-08-13 LAB — GLUCOSE, CAPILLARY: Glucose-Capillary: 103 mg/dL — ABNORMAL HIGH (ref 70–99)

## 2014-08-13 MED ORDER — SODIUM CHLORIDE 0.9 % IV SOLN
INTRAVENOUS | Status: DC
  Administered 2014-08-13: 10:00:00 via INTRAVENOUS

## 2014-08-13 MED ORDER — IBUPROFEN 600 MG PO TABS
600.0000 mg | ORAL_TABLET | Freq: Three times a day (TID) | ORAL | Status: DC | PRN
  Administered 2014-08-13: 600 mg via ORAL
  Filled 2014-08-13: qty 1

## 2014-08-13 NOTE — Discharge Instructions (Signed)
1. Take a probiotic while on antibiotics or eat yogurt daily.  2. Use your flutter valve QID upon discharge 3. Call MD if new symptoms:  Increased sputum production, change in color, worsening chest pain fevers etc

## 2014-08-13 NOTE — Consult Note (Signed)
Name: Christian Hubbard MRN: [id number] DOB: 31-May-1961    ADMISSION DATE:  08/10/2014 CONSULTATION DATE:  08/10/2014  REFERRING MD :  Charlies Silvers PRIMARY SERVICE:  TRH  CHIEF COMPLAINT:  fevers  BRIEF PATIENT DESCRIPTION: 53 y/o male with OSA presented to [facility] ED 9/14 c/o cough, fevers, SOB. CXR showed cavitary lesion in RLL. PCCM asked to see for further eval.  Drinks upto 6-12 beers/d, denies passing out, no dental issues  SIGNIFICANT EVENTS / STUDIES:  9/14 CT chest > large cavitary lesion with air fluid level in RLL, 3-4 mm nodule RUL 9/16 CT Neck > no acute findings, cystic mass in R neck near submandibular gland, likely sebaceous cyst, new R pleural fluid collection over the R neck base/ supraclavicular region  LINES / TUBES: PIV  CULTURES: Sputum 9/16>>> Urine 9/14>>>neg AFB 9/14>>>neg prelim>> Blood 9/14>>> AFB 9/16 >.   ANTIBIOTICS: Vanc 9/14 >>> Zosyn 9/14 >>>   SUBJECTIVE: Afebrile, AFB neg x 1 on prelim   VITAL SIGNS: Temp:  [98.2 F (36.8 C)-102.9 F (39.4 C)] 98.3 F (36.8 C) (09/17 0621) Pulse Rate:  [79-90] 79 (09/17 0621) Resp:  [18-20] 20 (09/17 0621) BP: (130-152)/(78-86) 130/78 mmHg (09/17 0621) SpO2:  [93 %-95 %] 93 % (09/17 0621) Weight:  [289 lb 14.5 oz (131.5 kg)] 289 lb 14.5 oz (131.5 kg) (09/17 9937)  PHYSICAL EXAMINATION: General:  Male, obese, in NAD Neuro:  Awake, alert, oriented. No focal deficit HEENT:  [place]/AT, PERRL, No JVD noted Cardiovascular:  RRR, tachy Lungs:  Clear, resps even unlabored Abdomen:  Soft, non-tender Musculoskeletal:  No acute deformity or ROM limitation Skin:  Intact   Recent Labs Lab 08/10/14 2333 08/11/14 0635 08/12/14 0350  NA 137 136* 142  K 3.5* 4.3 3.9  CL 97 99 102  CO2 25 23 24   BUN 13 11 10   CREATININE 1.07 0.97 1.06  GLUCOSE 88 102* 107*    Recent Labs Lab 08/10/14 2333 08/11/14 0635 08/12/14 0350  HGB 13.2 12.5* 12.5*  HCT 39.9 37.0* 39.1  WBC 18.2* 15.1* 16.2*  PLT 309 302 294    Ct Soft Tissue Neck W Contrast  08/12/2014   CLINICAL DATA:  Lung abscess. Shortness of breath and fever over the past few days with night sweats.  EXAM: CT NECK WITH CONTRAST  TECHNIQUE: Multidetector CT imaging of the neck was performed using the standard protocol following the bolus administration of intravenous contrast.  CONTRAST:  37mL OMNIPAQUE IOHEXOL 300 MG/ML  SOLN  COMPARISON:  Chest CT 08/10/2014  FINDINGS: Visualized aspect of the orbits as well as the paranasal sinuses and mastoid air cells are within normal. There is moderate streak artifact from dental hardware. Spaces of the suprahyoid neck are within normal without significant adenopathy, fluid or inflammatory change. Parotid and submandibular glands are normal and symmetric. There is a well-defined homogeneously fluid attenuation mass within the subcutaneous fat of the right neck just below the body of the mandible and abutting the skin measuring 2 x 2.5 cm. This mass is superficial to the platysma muscle. Airways patent. The epiglottis and subglottic airway are unremarkable. The infrahyoid neck is notable for a 1.2 cm node over the anterior lateral right neck base/supraclavicular region.  Visualize upper lungs demonstrate a layering right pleural fluid collection not seen previously. Sternoclavicular joints are within normal. There is minimal air over the articulation of the right anterior first rib to the sternum as there is no adjacent inflammatory change or fluid. There is a well-defined round 1.5 cm  hypodensity abutting the skin over the posterior neck base just right of midline at the C7 level likely a dermatologic lesion such as an epidermal inclusion cyst or sebaceous cyst.  IMPRESSION: No acute findings within the neck.  Well-defined rounded cystic mass within the subcutaneous fat of the right neck abutting the skin at the level of the submandibular gland. This likely represents a benign process and may be dermatologic in nature such  as an epidermal inclusion cyst/sebaceous cyst. There is a second rounded 1.5 cm cystic structure abutting the skin over the posterior right neck base at the C7 level which also likely represents a dermatologic process such as an epidermal inclusion cyst or sebaceous cyst.  Right pleural fluid collection new compared to the recent CT. 1.2 cm lymph node over the right neck base/supraclavicular region. This may be reactive due to the presumed infectious pulmonary parenchymal process/abscess.   Electronically Signed   By: Marin Olp M.D.   On: 08/12/2014 16:35    ASSESSMENT / PLAN:  RLL cavitary lesion - fever, leukocytosis, imaging c/w lung abscess Mediastinal LAN - likely reactive RUL Nodule - hx of heavy tobacco abuse OSA on CPAP at home  Rec's - Supplemental O2 as needed to maintain SpO2 greater than 92% - Continue empiric vancomycin and zosyn for now, can narrow to Augmentin but will defer abx to ID  - Will need 4-6 wks abx therapy - ETOH cessation + probiotic usage - Follow cultures - Use home CPAP if able, if not will need machine for nocturnal use.  - No FOB indicated at this time - Arranged for Pulmonary follow up with Dr. Elsworth Soho   PCCM will be available PRN.  Please call if we can be of further assistance.    Noe Gens, NP-C [facility] Pgr: [phone] or [phone]   Merton Border, MD ; [facility] service Mobile [phone].  After 5:30 PM or weekends, call [phone]

## 2014-08-13 NOTE — Progress Notes (Signed)
INFECTIOUS DISEASE PROGRESS NOTE  ID: Christian Hubbard is a 53 y.o. male with  Principal Problem:   Acute respiratory failure with hypoxia Active Problems:   CAP (community acquired pneumonia)   Cavitary lesion of lung   Leukocytosis   PNA (pneumonia)   Chest pain  Subjective: Decreased sputum production.   Abtx:  Anti-infectives   Start     Dose/Rate Route Frequency Ordered Stop   08/11/14 0800  vancomycin (VANCOCIN) 1,250 mg in sodium chloride 0.9 % 250 mL IVPB     1,250 mg 166.7 mL/hr over 90 Minutes Intravenous Every 12 hours 08/10/14 2033     08/11/14 0400  piperacillin-tazobactam (ZOSYN) IVPB 3.375 g     3.375 g 12.5 mL/hr over 240 Minutes Intravenous 3 times per day 08/10/14 2033     08/10/14 2000  vancomycin (VANCOCIN) 2,000 mg in sodium chloride 0.9 % 500 mL IVPB     2,000 mg 250 mL/hr over 120 Minutes Intravenous STAT 08/10/14 1811 08/11/14 2000   08/10/14 1815  piperacillin-tazobactam (ZOSYN) IVPB 3.375 g     3.375 g 100 mL/hr over 30 Minutes Intravenous STAT 08/10/14 1811 08/10/14 2019      Medications:  Scheduled: . enoxaparin (LOVENOX) injection  60 mg Subcutaneous Q24H  . pantoprazole  40 mg Oral Daily  . piperacillin-tazobactam (ZOSYN)  IV  3.375 g Intravenous 3 times per day  . simvastatin  20 mg Oral q1800  . vancomycin  1,250 mg Intravenous Q12H    Objective: Vital signs in last 24 hours: Temp:  [98.2 F (36.8 C)-102.9 F (39.4 C)] 98.3 F (36.8 C) (09/17 3016) Pulse Rate:  [79-90] 79 (09/17 0621) Resp:  [20] 20 (09/17 0621) BP: (130-133)/(78-86) 130/78 mmHg (09/17 0621) SpO2:  [93 %-94 %] 93 % (09/17 0621) Weight:  [131.5 kg (289 lb 14.5 oz)] 131.5 kg (289 lb 14.5 oz) (09/17 0109)   General appearance: alert, cooperative and no distress Resp: rhonchi bilaterally Cardio: regular rate and rhythm GI: normal findings: bowel sounds normal and soft, non-tender  Lab Results  Recent Labs  08/11/14 0635 08/12/14 0350  WBC 15.1* 16.2*    HGB 12.5* 12.5*  HCT 37.0* 39.1  NA 136* 142  K 4.3 3.9  CL 99 102  CO2 23 24  BUN 11 10  CREATININE 0.97 1.06   Liver Panel  Recent Labs  08/11/14 0635 08/12/14 0350  PROT 7.6 7.6  ALBUMIN 2.6* 2.5*  AST 26 23  ALT 25 25  ALKPHOS 130* 165*  BILITOT 0.6 0.6   Sedimentation Rate No results found for this basename: ESRSEDRATE,  in the last 72 hours C-Reactive Protein No results found for this basename: CRP,  in the last 72 hours  Microbiology: Recent Results (from the past 240 hour(s))  CULTURE, BLOOD (ROUTINE X 2)     Status: None   Collection Time    08/10/14  6:25 PM      Result Value Ref Range Status   Specimen Description BLOOD ARM LEFT   Final   Special Requests BOTTLES DRAWN AEROBIC AND ANAEROBIC 5CC   Final   Culture  Setup Time     Final   Value: 08/10/2014 22:34     Performed at [facility]   Culture     Final   Value:        BLOOD CULTURE RECEIVED NO GROWTH TO DATE CULTURE WILL BE HELD FOR 5 DAYS BEFORE ISSUING A FINAL NEGATIVE REPORT     Performed at  [facility]   Report Status PENDING   Incomplete  CULTURE, BLOOD (ROUTINE X 2)     Status: None   Collection Time    08/10/14  8:33 PM      Result Value Ref Range Status   Specimen Description BLOOD LEFT WRIST   Final   Special Requests BOTTLES DRAWN AEROBIC AND ANAEROBIC 5CC   Final   Culture  Setup Time     Final   Value: 08/11/2014 04:35     Performed at [facility]   Culture     Final   Value:        BLOOD CULTURE RECEIVED NO GROWTH TO DATE CULTURE WILL BE HELD FOR 5 DAYS BEFORE ISSUING A FINAL NEGATIVE REPORT     Performed at [facility]   Report Status PENDING   Incomplete  URINE CULTURE     Status: None   Collection Time    08/10/14 11:50 PM      Result Value Ref Range Status   Specimen Description URINE, CLEAN CATCH   Final   Special Requests NONE   Final   Culture  Setup Time     Final   Value: 08/11/2014 00:23     Performed at [facility]     Final   Value: NO GROWTH     Performed at [facility]   Culture     Final   Value: NO GROWTH     Performed at [facility]   Report Status 08/12/2014 FINAL   Final  GRAM STAIN     Status: None   Collection Time    08/11/14  4:42 AM      Result Value Ref Range Status   Specimen Description SPUTUM   Final   Special Requests NONE   Final   Gram Stain     Final   Value: ABUNDANT WBC PRESENT,BOTH PMN AND MONONUCLEAR     FEW GRAM POSITIVE RODS     FEW GRAM NEGATIVE RODS     FEW GRAM POSITIVE COCCI IN PAIRS     Results Called toTish Frederickson [phone] [place]   Report Status 08/11/2014 FINAL   Final  CULTURE, EXPECTORATED SPUTUM-ASSESSMENT     Status: None   Collection Time    08/11/14  4:42 AM      Result Value Ref Range Status   Specimen Description SPUTUM   Final   Special Requests NONE   Final   Sputum evaluation     Final   Value: MICROSCOPIC FINDINGS SUGGEST THAT THIS SPECIMEN IS NOT REPRESENTATIVE OF LOWER RESPIRATORY SECRETIONS. PLEASE RECOLLECT.     Results Called toTish Frederickson [id number] [place]   Report Status 08/11/2014 FINAL   Final  AFB CULTURE WITH SMEAR     Status: None   Collection Time    08/11/14  4:42 AM      Result Value Ref Range Status   Specimen Description SPUTUM   Final   Special Requests NONE   Final   Acid Fast Smear     Final   Value: NO ACID FAST BACILLI SEEN     Performed at [facility]   Culture     Final   Value: CULTURE WILL BE EXAMINED FOR 6 WEEKS BEFORE ISSUING A FINAL REPORT     Performed at [facility]   Report Status PENDING   Incomplete  CULTURE, EXPECTORATED SPUTUM-ASSESSMENT  Status: None   Collection Time    08/11/14  3:40 PM      Result Value Ref Range Status   Specimen Description SPUTUM   Final   Special Requests Normal   Final   Sputum evaluation     Final   Value: MICROSCOPIC FINDINGS SUGGEST THAT THIS SPECIMEN IS NOT REPRESENTATIVE OF LOWER RESPIRATORY SECRETIONS.  PLEASE RECOLLECT.     CALLED TO S.TUTTLE ,RN AT [email or web address] 08/11/14   Report Status 08/11/2014 FINAL   Final  CULTURE, EXPECTORATED SPUTUM-ASSESSMENT     Status: None   Collection Time    08/12/14  1:07 PM      Result Value Ref Range Status   Specimen Description SPUTUM   Final   Special Requests NONE   Final   Sputum evaluation     Final   Value: THIS SPECIMEN IS ACCEPTABLE. RESPIRATORY CULTURE REPORT TO FOLLOW.   Report Status 08/12/2014 FINAL   Final  CULTURE, RESPIRATORY (NON-EXPECTORATED)     Status: None   Collection Time    08/12/14  1:07 PM      Result Value Ref Range Status   Specimen Description SPUTUM   Final   Special Requests NONE   Final   Gram Stain     Final   Value: ABUNDANT WBC PRESENT,BOTH PMN AND MONONUCLEAR     RARE SQUAMOUS EPITHELIAL CELLS PRESENT     RARE GRAM POSITIVE COCCI     IN PAIRS IN CHAINS     Performed at [facility]   Culture     Final   Value: Culture reincubated for better growth     Performed at [facility]   Report Status PENDING   Incomplete  CULTURE, EXPECTORATED SPUTUM-ASSESSMENT     Status: None   Collection Time    08/12/14 10:13 PM      Result Value Ref Range Status   Specimen Description SPUTUM   Final   Special Requests NONE   Final   Sputum evaluation     Final   Value: THIS SPECIMEN IS ACCEPTABLE. RESPIRATORY CULTURE REPORT TO FOLLOW.   Report Status 08/12/2014 FINAL   Final  CULTURE, RESPIRATORY (NON-EXPECTORATED)     Status: None   Collection Time    08/12/14 10:13 PM      Result Value Ref Range Status   Specimen Description SPUTUM   Final   Special Requests NONE   Final   Gram Stain     Final   Value: ABUNDANT WBC PRESENT,BOTH PMN AND MONONUCLEAR     RARE SQUAMOUS EPITHELIAL CELLS PRESENT     FEW GRAM POSITIVE COCCI     IN PAIRS IN CLUSTERS RARE GRAM NEGATIVE RODS     Performed at [facility]   Culture PENDING   Incomplete   Report Status PENDING   Incomplete    Studies/Results: Ct  Soft Tissue Neck W Contrast  08/12/2014   CLINICAL DATA:  Lung abscess. Shortness of breath and fever over the past few days with night sweats.  EXAM: CT NECK WITH CONTRAST  TECHNIQUE: Multidetector CT imaging of the neck was performed using the standard protocol following the bolus administration of intravenous contrast.  CONTRAST:  43mL OMNIPAQUE IOHEXOL 300 MG/ML  SOLN  COMPARISON:  Chest CT 08/10/2014  FINDINGS: Visualized aspect of the orbits as well as the paranasal sinuses and mastoid air cells are within normal. There is moderate streak artifact from dental hardware. Spaces of the suprahyoid neck  are within normal without significant adenopathy, fluid or inflammatory change. Parotid and submandibular glands are normal and symmetric. There is a well-defined homogeneously fluid attenuation mass within the subcutaneous fat of the right neck just below the body of the mandible and abutting the skin measuring 2 x 2.5 cm. This mass is superficial to the platysma muscle. Airways patent. The epiglottis and subglottic airway are unremarkable. The infrahyoid neck is notable for a 1.2 cm node over the anterior lateral right neck base/supraclavicular region.  Visualize upper lungs demonstrate a layering right pleural fluid collection not seen previously. Sternoclavicular joints are within normal. There is minimal air over the articulation of the right anterior first rib to the sternum as there is no adjacent inflammatory change or fluid. There is a well-defined round 1.5 cm hypodensity abutting the skin over the posterior neck base just right of midline at the C7 level likely a dermatologic lesion such as an epidermal inclusion cyst or sebaceous cyst.  IMPRESSION: No acute findings within the neck.  Well-defined rounded cystic mass within the subcutaneous fat of the right neck abutting the skin at the level of the submandibular gland. This likely represents a benign process and may be dermatologic in nature such as an  epidermal inclusion cyst/sebaceous cyst. There is a second rounded 1.5 cm cystic structure abutting the skin over the posterior right neck base at the C7 level which also likely represents a dermatologic process such as an epidermal inclusion cyst or sebaceous cyst.  Right pleural fluid collection new compared to the recent CT. 1.2 cm lymph node over the right neck base/supraclavicular region. This may be reactive due to the presumed infectious pulmonary parenchymal process/abscess.   Electronically Signed   By: Marin Olp M.D.   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  If his AFB from 9-16 is negative, he can be d/c on augmentin for 1 month.           [name] Infectious Diseases (pager) [phone] [email or web address] 08/13/2014, 1:32 PM  LOS: 3 days

## 2014-08-13 NOTE — Progress Notes (Signed)
[facility] PROGRESS NOTE  Christian Hubbard OZH:[id number] DOB: 09-Sep-1961 DOA: 08/10/2014 PCP: Vena Austria, MD  Assessment/Plan: Principal Problem:   Acute respiratory failure with hypoxia Active Problems:   CAP (community acquired pneumonia)   Cavitary lesion of lung   Leukocytosis   PNA (pneumonia)   Chest pain     Acute respiratory failure with hypoxia  9/14 CT chest > large cavitary lesion with air fluid level in RLL, 3-4 mm nodule RUL  9/16 CT Neck > no acute findings, cystic mass in R neck near submandibular gland, likely sebaceous cyst, new R pleural fluid collection over the R neck base/ supraclavicular region Supplemental O2 as needed to maintain SpO2 greater than 92%  - Continue empiric vancomycin and zosyn for now, can narrow to Augmentin but will defer abx to ID  - Will need 4-6 wks abx therapy  - ETOH cessation + probiotic usage  - Follow cultures  - Use home CPAP if able, if not will need machine for nocturnal use.  - No FOB indicated at this time as per  Pulmonary?  - Arranged for Pulmonary follow up with Dr. Elsworth Soho  Discussed results of all the imaging studies with the patient by the bedside   Lung Abscess/Cavitary Lesion with sepsis  On Vanc and Zosyn. Pulmonology following. Continue current antibiotics. Reports travel to [place] in summer. Consult ID. Follow cultures. On airborne precautions.   Chest pain  Likely due to infectious process in lung. Troponin level is WNL   Code Status: full  Family Communication: family updated about patient's clinical progress  Disposition Plan: As above  Brief narrative:  53 year old male with no significant past medical history who presented to [facility] ED 08/10/2014 with complaints of cough, shortness of breath, fever for past few days prior to this admission. He was seen by PCP prior to coming to ED and his CXR showed possible lung abscess or TB and he was told to go to ED for further evaluation. Pt reported  having some night sweats but no weight loss. He had some right chest pain, non radiating, 5/10 in intensity which was present at rest, not alleviated with aleve. Of note, he was in [place] for vacation this summer but did not report having these symptoms when he arrived home.  In ED, BP was 146/84, HR 107-125, RR 23, T max 103.1 F. Oxygen saturation was 91% with [place] oxygen support at 2 L. WBC count was 16.3 otherwise unremarkable. The troponin level was WNL. The 12 lead EKG showed sinus tachycardia. CXR showed cavitary and thick walled right lower lobe lesion with air-fluid level. CT chest showed large uniformly thick-walled cavitary lesion with air-fluid level over the superior segment of the right lower lobe measuring 8 x 10.4 cm, some cystic bronchiectatic changes, mediastinal/ subcarinal adenopathy and 3-4 mm nodule over the right upper lobe.  cavitary process  Consultants:  Infectious disease Procedures:  None Antibiotics:  Zosyn, vancomycin  HPI/Subjective:  Still having a low-grade fever, denies chills No chest pain no cough  Objective: Filed Vitals:   08/12/14 1730 08/12/14 1853 08/12/14 2134 08/13/14 0621  BP:   133/86 130/78  Pulse:   90 79  Temp: 101 F (38.3 C) 98.2 F (36.8 C) 99.8 F (37.7 C) 98.3 F (36.8 C)  TempSrc:  Oral Oral Oral  Resp:   20 20  Height:    6\' 2"  (1.88 m)  Weight:    131.5 kg (289 lb 14.5 oz)  SpO2:   94%  93%    Intake/Output Summary (Last 24 hours) at 08/13/14 1230 Last data filed at 08/13/14 1000  Gross per 24 hour  Intake   1410 ml  Output      0 ml  Net   1410 ml    Exam:  General: alert & oriented x 3 In NAD  Cardiovascular: RRR, nl S1 s2  Respiratory: Decreased breath sounds at the bases, scattered rhonchi, no crackles  Abdomen: soft +BS NT/ND, no masses palpable  Extremities: No cyanosis and no edema      Data Reviewed: Basic Metabolic Panel:  Recent Labs Lab 08/10/14 1733 08/10/14 2333 08/11/14 0635 08/12/14 0350  NA  135* 137 136* 142  K 4.1 3.5* 4.3 3.9  CL 94* 97 99 102  CO2 23 25 23 24   GLUCOSE 94 88 102* 107*  BUN 13 13 11 10   CREATININE 0.98 1.07 0.97 1.06  CALCIUM 9.2 8.8 8.7 8.7  MG  --  2.1  --   --   PHOS  --  3.7  --   --     Liver Function Tests:  Recent Labs Lab 08/10/14 2333 08/11/14 0635 08/12/14 0350  AST 24 26 23   ALT 25 25 25   ALKPHOS 140* 130* 165*  BILITOT 0.7 0.6 0.6  PROT 8.3 7.6 7.6  ALBUMIN 2.9* 2.6* 2.5*   No results found for this basename: LIPASE, AMYLASE,  in the last 168 hours No results found for this basename: AMMONIA,  in the last 168 hours  CBC:  Recent Labs Lab 08/10/14 1733 08/10/14 2333 08/11/14 0635 08/12/14 0350  WBC 16.3* 18.2* 15.1* 16.2*  NEUTROABS  --  15.1*  --   --   HGB 13.7 13.2 12.5* 12.5*  HCT 41.2 39.9 37.0* 39.1  MCV 82.7 81.8 80.6 82.3  PLT 301 309 302 294    Cardiac Enzymes:  Recent Labs Lab 08/10/14 1826  TROPONINI <0.30   BNP (last 3 results) No results found for this basename: PROBNP,  in the last 8760 hours   CBG:  Recent Labs Lab 08/12/14 0733 08/13/14 0759  GLUCAP 102* 103*    Recent Results (from the past 240 hour(s))  CULTURE, BLOOD (ROUTINE X 2)     Status: None   Collection Time    08/10/14  6:25 PM      Result Value Ref Range Status   Specimen Description BLOOD ARM LEFT   Final   Special Requests BOTTLES DRAWN AEROBIC AND ANAEROBIC 5CC   Final   Culture  Setup Time     Final   Value: 08/10/2014 22:34     Performed at [facility]   Culture     Final   Value:        BLOOD CULTURE RECEIVED NO GROWTH TO DATE CULTURE WILL BE HELD FOR 5 DAYS BEFORE ISSUING A FINAL NEGATIVE REPORT     Performed at [facility]   Report Status PENDING   Incomplete  CULTURE, BLOOD (ROUTINE X 2)     Status: None   Collection Time    08/10/14  8:33 PM      Result Value Ref Range Status   Specimen Description BLOOD LEFT WRIST   Final   Special Requests BOTTLES DRAWN AEROBIC AND ANAEROBIC 5CC    Final   Culture  Setup Time     Final   Value: 08/11/2014 04:35     Performed at [place]     Final  Value:        BLOOD CULTURE RECEIVED NO GROWTH TO DATE CULTURE WILL BE HELD FOR 5 DAYS BEFORE ISSUING A FINAL NEGATIVE REPORT     Performed at [facility]   Report Status PENDING   Incomplete  URINE CULTURE     Status: None   Collection Time    08/10/14 11:50 PM      Result Value Ref Range Status   Specimen Description URINE, CLEAN CATCH   Final   Special Requests NONE   Final   Culture  Setup Time     Final   Value: 08/11/2014 00:23     Performed at [place]     Final   Value: NO GROWTH     Performed at [facility]   Culture     Final   Value: NO GROWTH     Performed at [facility]   Report Status 08/12/2014 FINAL   Final  GRAM STAIN     Status: None   Collection Time    08/11/14  4:42 AM      Result Value Ref Range Status   Specimen Description SPUTUM   Final   Special Requests NONE   Final   Gram Stain     Final   Value: ABUNDANT WBC PRESENT,BOTH PMN AND MONONUCLEAR     FEW GRAM POSITIVE RODS     FEW GRAM NEGATIVE RODS     FEW GRAM POSITIVE COCCI IN PAIRS     Results Called toTish Frederickson [phone] [place]   Report Status 08/11/2014 FINAL   Final  CULTURE, EXPECTORATED SPUTUM-ASSESSMENT     Status: None   Collection Time    08/11/14  4:42 AM      Result Value Ref Range Status   Specimen Description SPUTUM   Final   Special Requests NONE   Final   Sputum evaluation     Final   Value: MICROSCOPIC FINDINGS SUGGEST THAT THIS SPECIMEN IS NOT REPRESENTATIVE OF LOWER RESPIRATORY SECRETIONS. PLEASE RECOLLECT.     Results Called toTish Frederickson [phone] [facility]   Report Status 08/11/2014 FINAL   Final  AFB CULTURE WITH SMEAR     Status: None   Collection Time    08/11/14  4:42 AM      Result Value Ref Range Status   Specimen Description SPUTUM   Final   Special Requests NONE   Final    Acid Fast Smear     Final   Value: NO ACID FAST BACILLI SEEN     Performed at [facility]   Culture     Final   Value: CULTURE WILL BE EXAMINED FOR 6 WEEKS BEFORE ISSUING A FINAL REPORT     Performed at [facility]   Report Status PENDING   Incomplete  CULTURE, EXPECTORATED SPUTUM-ASSESSMENT     Status: None   Collection Time    08/11/14  3:40 PM      Result Value Ref Range Status   Specimen Description SPUTUM   Final   Special Requests Normal   Final   Sputum evaluation     Final   Value: MICROSCOPIC FINDINGS SUGGEST THAT THIS SPECIMEN IS NOT REPRESENTATIVE OF LOWER RESPIRATORY SECRETIONS. PLEASE RECOLLECT.     CALLED TO S.TUTTLE ,RN AT [place].PITT 08/11/14   Report Status 08/11/2014 FINAL   Final  CULTURE, EXPECTORATED SPUTUM-ASSESSMENT     Status: None  Collection Time    08/12/14  1:07 PM      Result Value Ref Range Status   Specimen Description SPUTUM   Final   Special Requests NONE   Final   Sputum evaluation     Final   Value: THIS SPECIMEN IS ACCEPTABLE. RESPIRATORY CULTURE REPORT TO FOLLOW.   Report Status 08/12/2014 FINAL   Final  CULTURE, RESPIRATORY (NON-EXPECTORATED)     Status: None   Collection Time    08/12/14  1:07 PM      Result Value Ref Range Status   Specimen Description SPUTUM   Final   Special Requests NONE   Final   Gram Stain     Final   Value: ABUNDANT WBC PRESENT,BOTH PMN AND MONONUCLEAR     RARE SQUAMOUS EPITHELIAL CELLS PRESENT     RARE GRAM POSITIVE COCCI     IN PAIRS IN CHAINS     Performed at [facility]   Culture     Final   Value: Culture reincubated for better growth     Performed at [facility]   Report Status PENDING   Incomplete  CULTURE, EXPECTORATED SPUTUM-ASSESSMENT     Status: None   Collection Time    08/12/14 10:13 PM      Result Value Ref Range Status   Specimen Description SPUTUM   Final   Special Requests NONE   Final   Sputum evaluation     Final   Value: THIS SPECIMEN IS  ACCEPTABLE. RESPIRATORY CULTURE REPORT TO FOLLOW.   Report Status 08/12/2014 FINAL   Final  CULTURE, RESPIRATORY (NON-EXPECTORATED)     Status: None   Collection Time    08/12/14 10:13 PM      Result Value Ref Range Status   Specimen Description SPUTUM   Final   Special Requests NONE   Final   Gram Stain     Final   Value: ABUNDANT WBC PRESENT,BOTH PMN AND MONONUCLEAR     RARE SQUAMOUS EPITHELIAL CELLS PRESENT     FEW GRAM POSITIVE COCCI     IN PAIRS IN CLUSTERS RARE GRAM NEGATIVE RODS     Performed at [facility]   Culture PENDING   Incomplete   Report Status PENDING   Incomplete     Studies: Dg Chest 2 View  08/10/2014   CLINICAL DATA:  Right-sided chest pain. Difficulty breathing for 2 days.  EXAM: CHEST  2 VIEW  COMPARISON:  05/21/2013 upper GI examination.  FINDINGS: Cavitary 12.9 cm in diameter right lower lobe lesion with internal air- fluid level. The left lung appears clear. Heart size within normal limits.  IMPRESSION: 1. Cavitary and thick walled right lower lobe lesion with air-fluid level. Differential diagnosis includes cavitary pneumonia such a staphylococcal pneumonia; cavitary malignancy ; or a large pulmonary abscess. It is recommended that the patient proceed to the emergency room and subsequently undergo CT of the chest (with contrast if feasible) for further characterization.  This report is being upgraded to a call report to expedite treatment.   Electronically Signed   By: Sherryl Barters M.D.   On: 08/10/2014 15:53   Ct Soft Tissue Neck W Contrast  08/12/2014   CLINICAL DATA:  Lung abscess. Shortness of breath and fever over the past few days with night sweats.  EXAM: CT NECK WITH CONTRAST  TECHNIQUE: Multidetector CT imaging of the neck was performed using the standard protocol following the bolus administration of intravenous contrast.  CONTRAST:  72mL  OMNIPAQUE IOHEXOL 300 MG/ML  SOLN  COMPARISON:  Chest CT 08/10/2014  FINDINGS: Visualized aspect of the  orbits as well as the paranasal sinuses and mastoid air cells are within normal. There is moderate streak artifact from dental hardware. Spaces of the suprahyoid neck are within normal without significant adenopathy, fluid or inflammatory change. Parotid and submandibular glands are normal and symmetric. There is a well-defined homogeneously fluid attenuation mass within the subcutaneous fat of the right neck just below the body of the mandible and abutting the skin measuring 2 x 2.5 cm. This mass is superficial to the platysma muscle. Airways patent. The epiglottis and subglottic airway are unremarkable. The infrahyoid neck is notable for a 1.2 cm node over the anterior lateral right neck base/supraclavicular region.  Visualize upper lungs demonstrate a layering right pleural fluid collection not seen previously. Sternoclavicular joints are within normal. There is minimal air over the articulation of the right anterior first rib to the sternum as there is no adjacent inflammatory change or fluid. There is a well-defined round 1.5 cm hypodensity abutting the skin over the posterior neck base just right of midline at the C7 level likely a dermatologic lesion such as an epidermal inclusion cyst or sebaceous cyst.  IMPRESSION: No acute findings within the neck.  Well-defined rounded cystic mass within the subcutaneous fat of the right neck abutting the skin at the level of the submandibular gland. This likely represents a benign process and may be dermatologic in nature such as an epidermal inclusion cyst/sebaceous cyst. There is a second rounded 1.5 cm cystic structure abutting the skin over the posterior right neck base at the C7 level which also likely represents a dermatologic process such as an epidermal inclusion cyst or sebaceous cyst.  Right pleural fluid collection new compared to the recent CT. 1.2 cm lymph node over the right neck base/supraclavicular region. This may be reactive due to the presumed  infectious pulmonary parenchymal process/abscess.   Electronically Signed   By: Marin Olp M.D.   On: 08/12/2014 16:35   Ct Chest W Contrast  08/10/2014   CLINICAL DATA:  Cavitary lesion on recent chest x-ray.  EXAM: CT CHEST WITH CONTRAST  TECHNIQUE: Multidetector CT imaging of the chest was performed during intravenous contrast administration.  CONTRAST:  50mL OMNIPAQUE IOHEXOL 300 MG/ML  SOLN  COMPARISON:  Chest x-ray today.  FINDINGS: Lungs are adequately inflated and demonstrate a large uniformly thick-walled cavitary lesion with air-fluid level over the superior segment of the right lower lobe measuring approximately 8 x 10.4 cm in AP and transverse dimensions. There is cystic bronchiectatic change along the anterior superior periphery of this cavitary lesion. There is mild adjacent atelectasis along the superior medial border. No significant effusion. 3-4 mm nodule over the anterior right upper lobe. Left lung is otherwise clear.  Heart is normal size. There is mild mediastinal adenopathy with a 1 cm right peritracheal lymph node and 1.3 cm subcarinal lymph node.  Images through the upper abdomen demonstrate mild diffuse hepatic steatosis. Remaining bone the soft tissue structures are unremarkable.  IMPRESSION: Large uniformly thick-walled cavitary lesion with air-fluid level over the superior segment of the right lower lobe measuring 8 x 10.4 cm in AP and transverse dimension. Mild adjacent atelectasis and cystic bronchiectatic change. Mild associated mediastinal/ subcarinal adenopathy. The main differential diagnosis remains cavitary pneumonia/ abscess versus malignancy.  3-4 mm nodule over the right upper lobe. Recommend followup as clinically indicated pending etiology of the above described cavitary process.  These results will be called to the ordering clinician or representative by the Radiologist Assistant, and communication documented in the PACS or zVision Dashboard.   Electronically Signed    By: Marin Olp M.D.   On: 08/10/2014 19:03    Scheduled Meds: . enoxaparin (LOVENOX) injection  60 mg Subcutaneous Q24H  . pantoprazole  40 mg Oral Daily  . piperacillin-tazobactam (ZOSYN)  IV  3.375 g Intravenous 3 times per day  . simvastatin  20 mg Oral q1800  . vancomycin  1,250 mg Intravenous Q12H   Continuous Infusions: . sodium chloride 10 mL/hr at 08/13/14 1024    Principal Problem:   Acute respiratory failure with hypoxia Active Problems:   CAP (community acquired pneumonia)   Cavitary lesion of lung   Leukocytosis   PNA (pneumonia)   Chest pain    Time spent: 40 minutes   [facility] Pager [phone]. If 7PM-7AM, please contact night-coverage at [email or web address], password [facility] 08/13/2014, 12:30 PM  LOS: 3 days

## 2014-08-14 LAB — CULTURE, RESPIRATORY

## 2014-08-14 LAB — CBC
HCT: 36.9 % — ABNORMAL LOW (ref 39.0–52.0)
Hemoglobin: 11.9 g/dL — ABNORMAL LOW (ref 13.0–17.0)
MCH: 26 pg (ref 26.0–34.0)
MCHC: 32.2 g/dL (ref 30.0–36.0)
MCV: 80.6 fL (ref 78.0–100.0)
PLATELETS: 335 10*3/uL (ref 150–400)
RBC: 4.58 MIL/uL (ref 4.22–5.81)
RDW: 14.2 % (ref 11.5–15.5)
WBC: 10.8 10*3/uL — ABNORMAL HIGH (ref 4.0–10.5)

## 2014-08-14 LAB — COMPREHENSIVE METABOLIC PANEL
ALT: 21 U/L (ref 0–53)
AST: 19 U/L (ref 0–37)
Albumin: 2.4 g/dL — ABNORMAL LOW (ref 3.5–5.2)
Alkaline Phosphatase: 105 U/L (ref 39–117)
Anion gap: 12 (ref 5–15)
BUN: 10 mg/dL (ref 6–23)
CO2: 26 meq/L (ref 19–32)
CREATININE: 0.99 mg/dL (ref 0.50–1.35)
Calcium: 8.8 mg/dL (ref 8.4–10.5)
Chloride: 99 mEq/L (ref 96–112)
Glucose, Bld: 106 mg/dL — ABNORMAL HIGH (ref 70–99)
Potassium: 4.2 mEq/L (ref 3.7–5.3)
Sodium: 137 mEq/L (ref 137–147)
TOTAL PROTEIN: 7.5 g/dL (ref 6.0–8.3)
Total Bilirubin: 0.4 mg/dL (ref 0.3–1.2)

## 2014-08-14 LAB — CULTURE, RESPIRATORY W GRAM STAIN: Culture: NORMAL

## 2014-08-14 LAB — GLUCOSE, CAPILLARY: Glucose-Capillary: 139 mg/dL — ABNORMAL HIGH (ref 70–99)

## 2014-08-14 MED ORDER — AMOXICILLIN-POT CLAVULANATE 875-125 MG PO TABS: 1.0000 | ORAL_TABLET | Freq: Two times a day (BID) | ORAL | Status: AC

## 2014-08-14 NOTE — Discharge Summary (Signed)
Physician Discharge Summary  Christian Hubbard MRN: [id number] DOB/AGE: [birth year] 53 y.o.  PCP: Vena Austria, MD   Admit date: 08/10/2014 Discharge date: 08/14/2014  Discharge Diagnoses:       Acute respiratory failure with hypoxia Active Problems:   CAP (community acquired pneumonia)   Cavitary lesion of lung   Leukocytosis   PNA (pneumonia)   Chest pain  Recommendations #1 followup with PCP in one week #2 Followup in infectious disease in 2 weeks #3 Followup with pulmonary as scheduled in November     Medication List         amoxicillin-clavulanate 875-125 MG per tablet  Commonly known as:  AUGMENTIN  Take 1 tablet by mouth 2 (two) times daily.     omeprazole 20 MG capsule  Commonly known as:  PRILOSEC  Take 20 mg by mouth daily.     pravastatin 40 MG tablet  Commonly known as:  PRAVACHOL  Take 40 mg by mouth daily.     sildenafil 100 MG tablet  Commonly known as:  VIAGRA  Take 100 mg by mouth daily as needed for erectile dysfunction.        Discharge Condition: Stable  Disposition: Final discharge disposition not confirmed   Consults: Infectious disease  Significant Diagnostic Studies: Dg Chest 2 View  08/10/2014   CLINICAL DATA:  Right-sided chest pain. Difficulty breathing for 2 days.  EXAM: CHEST  2 VIEW  COMPARISON:  05/21/2013 upper GI examination.  FINDINGS: Cavitary 12.9 cm in diameter right lower lobe lesion with internal air- fluid level. The left lung appears clear. Heart size within normal limits.  IMPRESSION: 1. Cavitary and thick walled right lower lobe lesion with air-fluid level. Differential diagnosis includes cavitary pneumonia such a staphylococcal pneumonia; cavitary malignancy ; or a large pulmonary abscess. It is recommended that the patient proceed to the emergency room and subsequently undergo CT of the chest (with contrast if feasible) for further characterization.  This report is being upgraded to a call report to  expedite treatment.   Electronically Signed   By: Sherryl Barters M.D.   On: 08/10/2014 15:53   Ct Soft Tissue Neck W Contrast  08/12/2014   CLINICAL DATA:  Lung abscess. Shortness of breath and fever over the past few days with night sweats.  EXAM: CT NECK WITH CONTRAST  TECHNIQUE: Multidetector CT imaging of the neck was performed using the standard protocol following the bolus administration of intravenous contrast.  CONTRAST:  16m OMNIPAQUE IOHEXOL 300 MG/ML  SOLN  COMPARISON:  Chest CT 08/10/2014  FINDINGS: Visualized aspect of the orbits as well as the paranasal sinuses and mastoid air cells are within normal. There is moderate streak artifact from dental hardware. Spaces of the suprahyoid neck are within normal without significant adenopathy, fluid or inflammatory change. Parotid and submandibular glands are normal and symmetric. There is a well-defined homogeneously fluid attenuation mass within the subcutaneous fat of the right neck just below the body of the mandible and abutting the skin measuring 2 x 2.5 cm. This mass is superficial to the platysma muscle. Airways patent. The epiglottis and subglottic airway are unremarkable. The infrahyoid neck is notable for a 1.2 cm node over the anterior lateral right neck base/supraclavicular region.  Visualize upper lungs demonstrate a layering right pleural fluid collection not seen previously. Sternoclavicular joints are within normal. There is minimal air over the articulation of the right anterior first rib to the sternum as there is no adjacent inflammatory change or fluid. There  is a well-defined round 1.5 cm hypodensity abutting the skin over the posterior neck base just right of midline at the C7 level likely a dermatologic lesion such as an epidermal inclusion cyst or sebaceous cyst.  IMPRESSION: No acute findings within the neck.  Well-defined rounded cystic mass within the subcutaneous fat of the right neck abutting the skin at the level of the  submandibular gland. This likely represents a benign process and may be dermatologic in nature such as an epidermal inclusion cyst/sebaceous cyst. There is a second rounded 1.5 cm cystic structure abutting the skin over the posterior right neck base at the C7 level which also likely represents a dermatologic process such as an epidermal inclusion cyst or sebaceous cyst.  Right pleural fluid collection new compared to the recent CT. 1.2 cm lymph node over the right neck base/supraclavicular region. This may be reactive due to the presumed infectious pulmonary parenchymal process/abscess.   Electronically Signed   By: Marin Olp M.D.   On: 08/12/2014 16:35   Ct Chest W Contrast  08/10/2014   CLINICAL DATA:  Cavitary lesion on recent chest x-ray.  EXAM: CT CHEST WITH CONTRAST  TECHNIQUE: Multidetector CT imaging of the chest was performed during intravenous contrast administration.  CONTRAST:  77m OMNIPAQUE IOHEXOL 300 MG/ML  SOLN  COMPARISON:  Chest x-ray today.  FINDINGS: Lungs are adequately inflated and demonstrate a large uniformly thick-walled cavitary lesion with air-fluid level over the superior segment of the right lower lobe measuring approximately 8 x 10.4 cm in AP and transverse dimensions. There is cystic bronchiectatic change along the anterior superior periphery of this cavitary lesion. There is mild adjacent atelectasis along the superior medial border. No significant effusion. 3-4 mm nodule over the anterior right upper lobe. Left lung is otherwise clear.  Heart is normal size. There is mild mediastinal adenopathy with a 1 cm right peritracheal lymph node and 1.3 cm subcarinal lymph node.  Images through the upper abdomen demonstrate mild diffuse hepatic steatosis. Remaining bone the soft tissue structures are unremarkable.  IMPRESSION: Large uniformly thick-walled cavitary lesion with air-fluid level over the superior segment of the right lower lobe measuring 8 x 10.4 cm in AP and transverse  dimension. Mild adjacent atelectasis and cystic bronchiectatic change. Mild associated mediastinal/ subcarinal adenopathy. The main differential diagnosis remains cavitary pneumonia/ abscess versus malignancy.  3-4 mm nodule over the right upper lobe. Recommend followup as clinically indicated pending etiology of the above described cavitary process.  These results will be called to the ordering clinician or representative by the Radiologist Assistant, and communication documented in the PACS or zVision Dashboard.   Electronically Signed   By: DMarin OlpM.D.   On: 08/10/2014 19:03      Microbiology: Recent Results (from the past 240 hour(s))  CULTURE, BLOOD (ROUTINE X 2)     Status: None   Collection Time    08/10/14  6:25 PM      Result Value Ref Range Status   Specimen Description BLOOD ARM LEFT   Final   Special Requests BOTTLES DRAWN AEROBIC AND ANAEROBIC 5CC   Final   Culture  Setup Time     Final   Value: 08/10/2014 22:34     Performed at [facility]  Culture     Final   Value:        BLOOD CULTURE RECEIVED NO GROWTH TO DATE CULTURE WILL BE HELD FOR 5 DAYS BEFORE ISSUING A FINAL NEGATIVE REPORT  Performed at [facility]   Report Status PENDING   Incomplete  CULTURE, BLOOD (ROUTINE X 2)     Status: None   Collection Time    08/10/14  8:33 PM      Result Value Ref Range Status   Specimen Description BLOOD LEFT WRIST   Final   Special Requests BOTTLES DRAWN AEROBIC AND ANAEROBIC 5CC   Final   Culture  Setup Time     Final   Value: 08/11/2014 04:35     Performed at [facility]   Culture     Final   Value:        BLOOD CULTURE RECEIVED NO GROWTH TO DATE CULTURE WILL BE HELD FOR 5 DAYS BEFORE ISSUING A FINAL NEGATIVE REPORT     Performed at [facility]   Report Status PENDING   Incomplete  URINE CULTURE     Status: None   Collection Time    08/10/14 11:50 PM      Result Value Ref Range Status   Specimen Description URINE, CLEAN CATCH    Final   Special Requests NONE   Final   Culture  Setup Time     Final   Value: 08/11/2014 00:23     Performed at [facility]     Final   Value: NO GROWTH     Performed at [facility]   Culture     Final   Value: NO GROWTH     Performed at [facility]   Report Status 08/12/2014 FINAL   Final  GRAM STAIN     Status: None   Collection Time    08/11/14  4:42 AM      Result Value Ref Range Status   Specimen Description SPUTUM   Final   Special Requests NONE   Final   Gram Stain     Final   Value: ABUNDANT WBC PRESENT,BOTH PMN AND MONONUCLEAR     FEW GRAM POSITIVE RODS     FEW GRAM NEGATIVE RODS     FEW GRAM POSITIVE COCCI IN PAIRS     Results Called toTish Frederickson [phone]   Report Status 08/11/2014 FINAL   Final  CULTURE, EXPECTORATED SPUTUM-ASSESSMENT     Status: None   Collection Time    08/11/14  4:42 AM      Result Value Ref Range Status   Specimen Description SPUTUM   Final   Special Requests NONE   Final   Sputum evaluation     Final   Value: MICROSCOPIC FINDINGS SUGGEST THAT THIS SPECIMEN IS NOT REPRESENTATIVE OF LOWER RESPIRATORY SECRETIONS. PLEASE RECOLLECT.     Results Called toTish Frederickson [phone] [place]   Report Status 08/11/2014 FINAL   Final  AFB CULTURE WITH SMEAR     Status: None   Collection Time    08/11/14  4:42 AM      Result Value Ref Range Status   Specimen Description SPUTUM   Final   Special Requests NONE   Final   Acid Fast Smear     Final   Value: NO ACID FAST BACILLI SEEN     Performed at [facility]   Culture     Final   Value: CULTURE WILL BE EXAMINED FOR 6 WEEKS BEFORE ISSUING A FINAL REPORT     Performed at [facility]   Report Status PENDING   Incomplete  CULTURE, EXPECTORATED  SPUTUM-ASSESSMENT     Status: None   Collection Time    08/11/14  3:40 PM      Result Value Ref Range Status   Specimen Description SPUTUM   Final   Special Requests Normal   Final    Sputum evaluation     Final   Value: MICROSCOPIC FINDINGS SUGGEST THAT THIS SPECIMEN IS NOT REPRESENTATIVE OF LOWER RESPIRATORY SECRETIONS. PLEASE RECOLLECT.     CALLED TO S.TUTTLE ,RN AT [email or web address] 08/11/14   Report Status 08/11/2014 FINAL   Final  CULTURE, EXPECTORATED SPUTUM-ASSESSMENT     Status: None   Collection Time    08/12/14  1:07 PM      Result Value Ref Range Status   Specimen Description SPUTUM   Final   Special Requests NONE   Final   Sputum evaluation     Final   Value: THIS SPECIMEN IS ACCEPTABLE. RESPIRATORY CULTURE REPORT TO FOLLOW.   Report Status 08/12/2014 FINAL   Final  CULTURE, RESPIRATORY (NON-EXPECTORATED)     Status: None   Collection Time    08/12/14  1:07 PM      Result Value Ref Range Status   Specimen Description SPUTUM   Final   Special Requests NONE   Final   Gram Stain     Final   Value: ABUNDANT WBC PRESENT,BOTH PMN AND MONONUCLEAR     RARE SQUAMOUS EPITHELIAL CELLS PRESENT     RARE GRAM POSITIVE COCCI     IN PAIRS IN CHAINS     Performed at [facility]   Culture     Final   Value: NORMAL OROPHARYNGEAL FLORA     Performed at [facility]   Report Status 08/14/2014 FINAL   Final  CULTURE, EXPECTORATED SPUTUM-ASSESSMENT     Status: None   Collection Time    08/12/14 10:13 PM      Result Value Ref Range Status   Specimen Description SPUTUM   Final   Special Requests NONE   Final   Sputum evaluation     Final   Value: THIS SPECIMEN IS ACCEPTABLE. RESPIRATORY CULTURE REPORT TO FOLLOW.   Report Status 08/12/2014 FINAL   Final  CULTURE, RESPIRATORY (NON-EXPECTORATED)     Status: None   Collection Time    08/12/14 10:13 PM      Result Value Ref Range Status   Specimen Description SPUTUM   Final   Special Requests NONE   Final   Gram Stain     Final   Value: ABUNDANT WBC PRESENT,BOTH PMN AND MONONUCLEAR     RARE SQUAMOUS EPITHELIAL CELLS PRESENT     FEW GRAM POSITIVE COCCI     IN PAIRS IN CLUSTERS RARE GRAM NEGATIVE RODS      Performed at [facility]   Culture     Final   Value: NORMAL OROPHARYNGEAL FLORA     Performed at [facility]   Report Status PENDING   Incomplete  AFB CULTURE WITH SMEAR     Status: None   Collection Time    08/12/14 10:14 PM      Result Value Ref Range Status   Specimen Description SPUTUM   Final   Special Requests NONE   Final   Acid Fast Smear     Final   Value: NO ACID FAST BACILLI SEEN     Performed at [facility]   Culture     Final   Value: CULTURE WILL BE  EXAMINED FOR 6 WEEKS BEFORE ISSUING A FINAL REPORT     Performed at [facility]   Report Status PENDING   Incomplete     Labs: Results for orders placed during the hospital encounter of 08/10/14 (from the past 48 hour(s))  CULTURE, EXPECTORATED SPUTUM-ASSESSMENT     Status: None   Collection Time    08/12/14  1:07 PM      Result Value Ref Range   Specimen Description SPUTUM     Special Requests NONE     Sputum evaluation       Value: THIS SPECIMEN IS ACCEPTABLE. RESPIRATORY CULTURE REPORT TO FOLLOW.   Report Status 08/12/2014 FINAL    CULTURE, RESPIRATORY (NON-EXPECTORATED)     Status: None   Collection Time    08/12/14  1:07 PM      Result Value Ref Range   Specimen Description SPUTUM     Special Requests NONE     Gram Stain       Value: ABUNDANT WBC PRESENT,BOTH PMN AND MONONUCLEAR     RARE SQUAMOUS EPITHELIAL CELLS PRESENT     RARE GRAM POSITIVE COCCI     IN PAIRS IN CHAINS     Performed at [facility]   Culture       Value: NORMAL OROPHARYNGEAL FLORA     Performed at [facility]   Report Status 08/14/2014 FINAL    CULTURE, EXPECTORATED SPUTUM-ASSESSMENT     Status: None   Collection Time    08/12/14 10:13 PM      Result Value Ref Range   Specimen Description SPUTUM     Special Requests NONE     Sputum evaluation       Value: THIS SPECIMEN IS ACCEPTABLE. RESPIRATORY CULTURE REPORT TO FOLLOW.   Report Status 08/12/2014 FINAL    CULTURE,  RESPIRATORY (NON-EXPECTORATED)     Status: None   Collection Time    08/12/14 10:13 PM      Result Value Ref Range   Specimen Description SPUTUM     Special Requests NONE     Gram Stain       Value: ABUNDANT WBC PRESENT,BOTH PMN AND MONONUCLEAR     RARE SQUAMOUS EPITHELIAL CELLS PRESENT     FEW GRAM POSITIVE COCCI     IN PAIRS IN CLUSTERS RARE GRAM NEGATIVE RODS     Performed at [facility]   Culture       Value: NORMAL OROPHARYNGEAL FLORA     Performed at [facility]   Report Status PENDING    AFB CULTURE WITH SMEAR     Status: None   Collection Time    08/12/14 10:14 PM      Result Value Ref Range   Specimen Description SPUTUM     Special Requests NONE     Acid Fast Smear       Value: NO ACID FAST BACILLI SEEN     Performed at [facility]   Culture       Value: CULTURE WILL BE EXAMINED FOR 6 WEEKS BEFORE ISSUING A FINAL REPORT     Performed at [facility]   Report Status PENDING    GLUCOSE, CAPILLARY     Status: Abnormal   Collection Time    08/13/14  7:59 AM      Result Value Ref Range   Glucose-Capillary 103 (*) 70 - 99 mg/dL  CBC     Status: Abnormal   Collection Time  08/14/14  4:58 AM      Result Value Ref Range   WBC 10.8 (*) 4.0 - 10.5 K/uL   RBC 4.58  4.22 - 5.81 MIL/uL   Hemoglobin 11.9 (*) 13.0 - 17.0 g/dL   HCT 36.9 (*) 39.0 - 52.0 %   MCV 80.6  78.0 - 100.0 fL   MCH 26.0  26.0 - 34.0 pg   MCHC 32.2  30.0 - 36.0 g/dL   RDW 14.2  11.5 - 15.5 %   Platelets 335  150 - 400 K/uL  COMPREHENSIVE METABOLIC PANEL     Status: Abnormal   Collection Time    08/14/14  4:58 AM      Result Value Ref Range   Sodium 137  137 - 147 mEq/L   Potassium 4.2  3.7 - 5.3 mEq/L   Chloride 99  96 - 112 mEq/L   CO2 26  19 - 32 mEq/L   Glucose, Bld 106 (*) 70 - 99 mg/dL   BUN 10  6 - 23 mg/dL   Creatinine, Ser 0.99  0.50 - 1.35 mg/dL   Calcium 8.8  8.4 - 10.5 mg/dL   Total Protein 7.5  6.0 - 8.3 g/dL   Albumin 2.4 (*) 3.5 - 5.2 g/dL    AST 19  0 - 37 U/L   ALT 21  0 - 53 U/L   Alkaline Phosphatase 105  39 - 117 U/L   Total Bilirubin 0.4  0.3 - 1.2 mg/dL   GFR calc non Af Amer >90  >90 mL/min   GFR calc Af Amer >90  >90 mL/min   Comment: (NOTE)     The eGFR has been calculated using the CKD EPI equation.     This calculation has not been validated in all clinical situations.     eGFR's persistently <90 mL/min signify possible Chronic Kidney     Disease.   Anion gap 12  5 - 15  GLUCOSE, CAPILLARY     Status: Abnormal   Collection Time    08/14/14  7:32 AM      Result Value Ref Range   Glucose-Capillary 139 (*) 70 - 99 mg/dL     HPI 53 yo M with hx of GERD, OSA on CPAP, ETOH abuse, comes to hospital on 9-14 with hx of SOB, cough, fever. He was seen by his PCP and had CXR showing lung abscess vs TB. He had Temp 103.1 and was hypoxic (91% on 2L), WBC 16.3. He had a CT showing a 8 x 10 cm thick walled lesions with air-fluid level in RLL, nodule seen RUL. Marland Kitchen  No TB exposure hx, no hx incarceration, no hx homelessness  Was on Vacation in [place] June 2015 for 1` month. Denies drinking til he "passed out".  He was started on vanco/zosyn. He has been evaluated by pulmonary.  9/14 CT chest > large cavitary lesion with air fluid level in RLL, 3-4 mm nodule RUL  9/16 CT Neck > no acute findings, cystic mass in R neck near submandibular gland, likely sebaceous cyst, new R pleural fluid collection over the R neck base/ supraclavicular region   HOSPITAL COURSE:  Acute respiratory failure with hypoxia/cavitary pneumonia  9/14 CT chest > large cavitary lesion with air fluid level in RLL, 3-4 mm nodule RUL  9/16 CT Neck > no acute findings, cystic mass in R neck near submandibular gland, likely sebaceous cyst, new R pleural fluid collection over the R neck base/ supraclavicular region  Patient  empirically started on vancomycin and Zosyn Infectious disease consulted Transition to Augmentin for one month - Recommended ETOH cessation +  probiotic usage  AFB stain x2 have been negative Expectorated sputum culture has been negative - Use home CPAP if able, if not available will need a formal sleep study  - No FOB indicated at this time as per Pulmonary - Arranged for Pulmonary follow up with Dr. Elsworth Soho is scheduled below Followup with infectious disease in 2 weeks, Dr. Johnnye Sima Discussed with Dr. Johnnye Sima that the patient had a fever yesterday evening Okay to discharge from his standpoint Discussed results of all the imaging studies with the patient by the bedside     Chest pain  Likely due to infectious process in lung. Troponin level is WNL     Discharge Exam:   Blood pressure 121/68, pulse 79, temperature 98.8 F (37.1 C), temperature source Oral, resp. rate 18, height '6\' 2"'  (1.88 m), weight 130.7 kg (288 lb 2.3 oz), SpO2 95.00%.  General appearance: alert, cooperative and no distress  Resp: rhonchi bilaterally  Cardio: regular rate and rhythm  GI: normal findings: bowel sounds normal and soft, non-tender           Discharge Instructions   Diet - low sodium heart healthy    Complete by:  As directed      Increase activity slowly    Complete by:  As directed            Follow-up Information   Follow up with Rigoberto Noel., MD On 10/26/2014. (Appt at 3:30 PM.  Arrive at 3:15 for chest XRAY )    Specialty:  Pulmonary Disease   Contact information:   [address] [address] [phone]       Signed: Reyne Dumas 08/14/2014, 10:57 AM

## 2014-08-14 NOTE — Discharge Planning (Signed)
Copy of AVS to patient who verbalizes understanding. Rx has been called into pt's pharmacy per MD.  DC amb. To private car home with all personal belongings including pt's own CPAP AT 1135.

## 2014-08-15 LAB — CULTURE, RESPIRATORY: CULTURE: NORMAL

## 2014-08-16 LAB — CULTURE, BLOOD (ROUTINE X 2): Culture: NO GROWTH

## 2014-08-17 LAB — CULTURE, BLOOD (ROUTINE X 2): CULTURE: NO GROWTH

## 2014-09-10 ENCOUNTER — Encounter: Payer: Self-pay | Admitting: Infectious Diseases

## 2014-09-10 ENCOUNTER — Ambulatory Visit (INDEPENDENT_AMBULATORY_CARE_PROVIDER_SITE_OTHER): Payer: 59 | Admitting: Infectious Diseases

## 2014-09-10 VITALS — BP 133/87 | HR 68 | Temp 98.3°F | Ht 74.0 in | Wt 269.0 lb

## 2014-09-10 DIAGNOSIS — J984 Other disorders of lung: Secondary | ICD-10-CM

## 2014-09-10 NOTE — Progress Notes (Signed)
   Subjective:    Patient ID: Christian Hubbard, male    DOB: December 09, 1960, 52 y.o.   MRN: [id number]  HPI 53 yo M with hx of GERD, OSA on CPAP, ETOH abuse, comes to hospital on 9-14 with hx of SOB, cough, fever. He was seen by his PCP and had CXR showing lung abscess vs TB. He had Temp 103.1 and was hypoxic (91% on 2L), WBC 16.3. He had a CT showing a 8 x 10 cm thick walled lesions with air-fluid level in RLL, nodule seen RUL. Marland Kitchen  No TB exposure hx, no hx incarceration, no hx homelessness  Was on Vacation in [place] June 2015 for 1` month. Denies drinking til he "passed out".  He was started on vanco/zosyn and treated with this til 9-18 when he was d/c home on augmentin.  Feels great today. Has been back to work 3 days. No cough. No SOB. Not needing to use CPAP since home. No f/c.  Chest pain/.soreness is better.  Has 3 days left of augmentin.  Has intentionally lost 20# and feels much better.   Review of Systems  Constitutional: Negative for fever and chills.  Respiratory: Negative for cough and shortness of breath.   Cardiovascular: Negative for chest pain.  Gastrointestinal: Negative for diarrhea and constipation.  Genitourinary: Negative for difficulty urinating.       Objective:   Physical Exam  Constitutional: He appears well-developed and well-nourished.  HENT:  Mouth/Throat: No oropharyngeal exudate.  Eyes: EOM are normal. Pupils are equal, round, and reactive to light.  Neck: Neck supple.    Cardiovascular: Normal rate, regular rhythm and normal heart sounds.   Pulmonary/Chest: Effort normal and breath sounds normal. He has no wheezes. He has no rales.  Abdominal: Soft. Bowel sounds are normal. He exhibits no distension. There is no tenderness.  Musculoskeletal: He exhibits no edema.  Lymphadenopathy:    He has no cervical adenopathy.    He has no axillary adenopathy.       Right: No supraclavicular adenopathy present.       Left: No supraclavicular adenopathy present.           Assessment & Plan:

## 2014-09-10 NOTE — Assessment & Plan Note (Signed)
He is doing much better. Will schedule him for repeat CT of his chest to f/u his nodule and to f/u his abscess.  Will call him with results.  He has pulmonary f/u scheduled for end of November.

## 2014-09-17 ENCOUNTER — Telehealth: Payer: Self-pay | Admitting: *Deleted

## 2014-09-17 NOTE — Telephone Encounter (Signed)
Patient notified of appointment for CT at [facility] on Wednesday 09/23/14 at 8:45 AM. This test has been prior authorized through [employer] # [phone]

## 2014-09-21 ENCOUNTER — Ambulatory Visit (HOSPITAL_COMMUNITY): Payer: 59

## 2014-09-23 ENCOUNTER — Ambulatory Visit (HOSPITAL_COMMUNITY)
Admission: RE | Admit: 2014-09-23 | Discharge: 2014-09-23 | Disposition: A | Discharge status: Home / Self Care | Payer: 59 | Source: Ambulatory Visit | Attending: Infectious Diseases | Admitting: Infectious Diseases

## 2014-09-23 DIAGNOSIS — R911 Solitary pulmonary nodule: Secondary | ICD-10-CM | POA: Diagnosis not present

## 2014-09-23 DIAGNOSIS — J984 Other disorders of lung: Secondary | ICD-10-CM

## 2014-09-23 MED ORDER — IOHEXOL 300 MG/ML  SOLN
80.0000 mL | Freq: Once | INTRAMUSCULAR | Status: AC | PRN
  Administered 2014-09-23: 80 mL via INTRAVENOUS

## 2014-09-24 LAB — AFB CULTURE WITH SMEAR (NOT AT ARMC): Acid Fast Smear: NONE SEEN

## 2014-09-28 LAB — AFB CULTURE WITH SMEAR (NOT AT ARMC): Acid Fast Smear: NONE SEEN

## 2014-10-16 ENCOUNTER — Telehealth: Payer: Self-pay | Admitting: Infectious Diseases

## 2014-10-16 NOTE — Telephone Encounter (Signed)
error 

## 2014-10-26 ENCOUNTER — Encounter: Payer: Self-pay | Admitting: Pulmonary Disease

## 2014-10-26 ENCOUNTER — Ambulatory Visit (INDEPENDENT_AMBULATORY_CARE_PROVIDER_SITE_OTHER): Payer: 59 | Admitting: Pulmonary Disease

## 2014-10-26 ENCOUNTER — Ambulatory Visit (INDEPENDENT_AMBULATORY_CARE_PROVIDER_SITE_OTHER)
Admission: RE | Admit: 2014-10-26 | Discharge: 2014-10-26 | Disposition: A | Discharge status: Home / Self Care | Payer: 59 | Source: Ambulatory Visit | Attending: Pulmonary Disease | Admitting: Pulmonary Disease

## 2014-10-26 ENCOUNTER — Other Ambulatory Visit: Payer: Self-pay | Admitting: Pulmonary Disease

## 2014-10-26 VITALS — BP 144/98 | HR 70 | Ht 74.0 in | Wt 281.6 lb

## 2014-10-26 DIAGNOSIS — R911 Solitary pulmonary nodule: Secondary | ICD-10-CM

## 2014-10-26 DIAGNOSIS — J189 Pneumonia, unspecified organism: Secondary | ICD-10-CM

## 2014-10-26 DIAGNOSIS — J181 Lobar pneumonia, unspecified organism: Principal | ICD-10-CM

## 2014-10-26 DIAGNOSIS — J984 Other disorders of lung: Secondary | ICD-10-CM

## 2014-10-26 NOTE — Patient Instructions (Signed)
Lung abscess is healing You have a 53mm nodule in your right upper lung - this requires a follow up CT scan in 6 months

## 2014-10-26 NOTE — Progress Notes (Signed)
   Subjective:    Patient ID: Christian Hubbard, male    DOB: [birth year], 53 y.o.   MRN: [id number]  HPI  53 y/o male with OSA presented to [facility] ED 9/14 c/o cough, fevers, SOB. CXR showed cavitary lesion in RLL. PCCM asked to see for further eval.  Drinks upto 6-12 beers/d, denies passing out, no dental issues  SIGNIFICANT EVENTS / STUDIES:  08/10/14 CT chest > large cavitary lesion with air fluid level in RLL, 3-4 mm nodule RUL 9/16 CT Neck > no acute findings, cystic mass in R neck near submandibular gland, likely sebaceous cyst, new R pleural fluid collection over the R neck base/ supraclavicular region  Urine 9/14>>>neg AFB 9/14>>>neg  Took Abx x 30 ds  CT chest 09/23/14 -Marked interval improvement , resolved mediastinal LNs , stable RUL nodule  Chief Complaint  Patient presents with  . Hospitalization Follow-up    pneumonia; discuss CXR results to determine if abcess is still there   Feels much improved, denies fevers, cough OSA on CPAP at home Christian Hubbard) has lost 20 lbs & prefers to fu with PCP  Review of Systems neg for any significant sore throat, dysphagia, itching, sneezing, nasal congestion or excess/ purulent secretions, fever, chills, sweats, unintended wt loss, pleuritic or exertional cp, hempoptysis, orthopnea pnd or change in chronic leg swelling. Also denies presyncope, palpitations, heartburn, abdominal pain, nausea, vomiting, diarrhea or change in bowel or urinary habits, dysuria,hematuria, rash, arthralgias, visual complaints, headache, numbness weakness or ataxia.     Objective:   Physical Exam  Gen. Pleasant, obese, in no distress ENT - no lesions, no post nasal drip Neck: No JVD, no thyromegaly, no carotid bruits Lungs: no use of accessory muscles, no dullness to percussion, decreased without rales or rhonchi  Cardiovascular: Rhythm regular, heart sounds  normal, no murmurs or gallops, no peripheral edema Musculoskeletal: No deformities, no cyanosis or  clubbing , no tremors       Assessment & Plan:

## 2014-10-26 NOTE — Assessment & Plan Note (Signed)
this requires a follow up CT scan in 6 months for stability

## 2014-10-26 NOTE — Assessment & Plan Note (Signed)
Lung abscess is healing on fu imaging

## 2015-02-24 ENCOUNTER — Ambulatory Visit (INDEPENDENT_AMBULATORY_CARE_PROVIDER_SITE_OTHER): Payer: 59 | Admitting: Family Medicine

## 2015-02-24 VITALS — BP 148/100 | HR 80 | Temp 97.8°F | Resp 16 | Ht 74.0 in | Wt 288.0 lb

## 2015-02-24 DIAGNOSIS — Z113 Encounter for screening for infections with a predominantly sexual mode of transmission: Secondary | ICD-10-CM | POA: Diagnosis not present

## 2015-02-24 NOTE — Patient Instructions (Signed)
Good to see you today- I will be in touch with your results asap. Please plan to come by for a lab visit only in 3-4 weeks for blood work- it is a little early to do these labs now

## 2015-02-24 NOTE — Progress Notes (Addendum)
[facility] [address], [place] [address] [phone]  Date:  02/24/2015   Name:  Christian Hubbard   DOB:  09/08/1961   MRN:  [id number]  PCP:  Vena Austria, MD    Chief Complaint: Test for STD   History of Present Illness:  Christian Hubbard is a 54 y.o. very pleasant male patient who presents with the following:  He had a condom break during intercourse about 3 days ago.  He has noted a sensation of "warmth" in his penis and wanted to be screened for STI.  No dysuria, no discharge.   He is generally in good health.  NKDA He did a dirty catch urine today.     Patient Active Problem List   Diagnosis Date Noted  . Solitary pulmonary nodule 10/26/2014  . Cavitary lesion of lung 08/10/2014    Past Medical History  Diagnosis Date  . Hyperlipemia   . GERD (gastroesophageal reflux disease)   . OSA on CPAP   . Pneumonia 08/10/2014    Past Surgical History  Procedure Laterality Date  . No past surgeries      History  Substance Use Topics  . Smoking status: Former Smoker -- 1.00 packs/day for 24 years    Types: Cigarettes    Quit date: 11/27/2004  . Smokeless tobacco: Never Used  . Alcohol Use: No     Comment: 08/10/2014 "I drink 6-7 12 oz beers a few days/wk; probably 24/wk"    No family history on file.  No Known Allergies  Medication list has been reviewed and updated.  Current Outpatient Prescriptions on File Prior to Visit  Medication Sig Dispense Refill  . omeprazole (PRILOSEC) 20 MG capsule Take 20 mg by mouth daily.    . pravastatin (PRAVACHOL) 40 MG tablet Take 40 mg by mouth daily.    . sildenafil (VIAGRA) 100 MG tablet Take 100 mg by mouth daily as needed for erectile dysfunction.     No current facility-administered medications on file prior to visit.    Review of Systems:  As per HPI- otherwise negative.   Physical Examination: Filed Vitals:   02/24/15 1630  BP: 148/100  Pulse: 80  Temp: 97.8 F  (36.6 C)  Resp: 16   Filed Vitals:   02/24/15 1630  Height: 6\' 2"  (1.88 m)  Weight: 288 lb (130.636 kg)   Body mass index is 36.96 kg/(m^2). Ideal Body Weight: Weight in (lb) to have BMI = 25: 194.3  GEN: WDWN, NAD, Non-toxic, A & O x 3, obese, looks well HEENT: Atraumatic, Normocephalic. Neck supple. No masses, No LAD. Ears and Nose: No external deformity. CV: RRR, No M/G/R. No JVD. No thrill. No extra heart sounds. PULM: CTA B, no wheezes, crackles, rhonchi. No retractions. No resp. distress. No accessory muscle use. EXTR: No c/c/e NEURO Normal gait.  PSYCH: Normally interactive. Conversant. Not depressed or anxious appearing.  Calm demeanor.  Normal penis, no discharge.  No testicular tenderness    Assessment and Plan: Screen for STD (sexually transmitted disease) - Plan: GC/Chlamydia Probe Amp, HIV antibody, RPR, Hepatitis B surface antibody, Hepatitis B surface antigen, Hepatitis C antibody  Gonorrhea and chlamydia testing today, will follow-up with results asap He will plan to come in for other labs in a few weeks.   Signed Lamar Blinks, MD  Ok to [facility] on cell phone.   Called 3/31- let him know that his genprobe is negative.  He will let me know if  any sx persist

## 2015-02-25 LAB — GC/CHLAMYDIA PROBE AMP
CT Probe RNA: NEGATIVE
GC Probe RNA: NEGATIVE

## 2015-03-05 ENCOUNTER — Other Ambulatory Visit (INDEPENDENT_AMBULATORY_CARE_PROVIDER_SITE_OTHER): Payer: 59 | Admitting: *Deleted

## 2015-03-05 DIAGNOSIS — Z113 Encounter for screening for infections with a predominantly sexual mode of transmission: Secondary | ICD-10-CM

## 2015-03-05 DIAGNOSIS — R911 Solitary pulmonary nodule: Secondary | ICD-10-CM | POA: Diagnosis not present

## 2015-03-05 NOTE — Progress Notes (Signed)
Pt here for lab draw only  

## 2015-03-06 LAB — RPR

## 2015-03-06 LAB — HEPATITIS C ANTIBODY: HCV AB: NEGATIVE

## 2015-03-06 LAB — HIV ANTIBODY (ROUTINE TESTING W REFLEX): HIV 1&2 Ab, 4th Generation: NONREACTIVE

## 2015-03-06 LAB — HEPATITIS B SURFACE ANTIBODY, QUANTITATIVE: Hepatitis B-Post: 2.5 m[IU]/mL

## 2015-03-06 LAB — HEPATITIS B SURFACE ANTIGEN: Hepatitis B Surface Ag: NEGATIVE

## 2015-03-07 ENCOUNTER — Encounter: Payer: Self-pay | Admitting: Family Medicine

## 2015-03-23 ENCOUNTER — Telehealth: Payer: Self-pay

## 2015-03-23 NOTE — Telephone Encounter (Signed)
Called him back- he did receive my letter with his labs. However states he would still like "to be treated for STDs because something doesn't feel right."  Advised him that in this case there may be another reason why he is not feeling normal, and we should take another look at him.  He agrees to come back in at his convenience

## 2015-03-23 NOTE — Telephone Encounter (Signed)
Patient requesting to speak with Dr copland regarding his lab results. Per patient a medication will need to be called in for him according to his results. Patient uses [facility] on [address] and his call back number is [phone]

## 2015-03-23 NOTE — Telephone Encounter (Signed)
Pt calling about labs. They haven't been reviewed yet. Also, he's speaking about a medication but I'm not sure for what since all of his labs look normal. Please advise.

## 2015-04-14 ENCOUNTER — Inpatient Hospital Stay: Admission: RE | Admit: 2015-04-14 | Payer: 59 | Source: Ambulatory Visit

## 2015-04-16 ENCOUNTER — Ambulatory Visit (INDEPENDENT_AMBULATORY_CARE_PROVIDER_SITE_OTHER): Payer: 59 | Admitting: Family Medicine

## 2015-04-16 ENCOUNTER — Ambulatory Visit (INDEPENDENT_AMBULATORY_CARE_PROVIDER_SITE_OTHER)
Admission: RE | Admit: 2015-04-16 | Discharge: 2015-04-16 | Disposition: A | Discharge status: Home / Self Care | Payer: 59 | Source: Ambulatory Visit | Attending: Pulmonary Disease | Admitting: Pulmonary Disease

## 2015-04-16 VITALS — BP 130/84 | HR 84 | Temp 97.6°F | Resp 18 | Ht 75.0 in | Wt 297.0 lb

## 2015-04-16 DIAGNOSIS — R911 Solitary pulmonary nodule: Secondary | ICD-10-CM | POA: Diagnosis not present

## 2015-04-16 DIAGNOSIS — N4889 Other specified disorders of penis: Secondary | ICD-10-CM

## 2015-04-16 LAB — POCT UA - MICROSCOPIC ONLY
Bacteria, U Microscopic: NEGATIVE
Casts, Ur, LPF, POC: NEGATIVE
Crystals, Ur, HPF, POC: NEGATIVE
Epithelial cells, urine per micros: NEGATIVE
Mucus, UA: NEGATIVE
RBC, urine, microscopic: NEGATIVE
WBC, Ur, HPF, POC: NEGATIVE
Yeast, UA: NEGATIVE

## 2015-04-16 LAB — POCT URINALYSIS DIPSTICK
Bilirubin, UA: NEGATIVE
Glucose, UA: NEGATIVE
Ketones, UA: NEGATIVE
Leukocytes, UA: NEGATIVE
Nitrite, UA: NEGATIVE
Protein, UA: NEGATIVE
Spec Grav, UA: 1.015
Urobilinogen, UA: 0.2
pH, UA: 7

## 2015-04-16 MED ORDER — DOXYCYCLINE HYCLATE 100 MG PO TABS: 100.0000 mg | ORAL_TABLET | Freq: Two times a day (BID) | ORAL | Status: DC

## 2015-04-16 NOTE — Progress Notes (Signed)
Subjective:    Patient ID: Christian Hubbard, male    DOB: 11/07/1961, 54 y.o.   MRN: [id number] This chart was scribed for Robyn Haber, MD by Steva Colder, ED Scribe. The patient was seen in room 11 at 10:12 AM.   Chief Complaint  Patient presents with   Penis Pain    Burning on outside    HPI  Christian Hubbard is a 54 y.o. male who presents today complaining of penis pain onset 1 month. Pt is here for a UA test. Pt reports that it feels as if the side of his shaft is heated but not burning. Pt had a blood test and UA test in the past and he still does not feel right. Pt is single with one partner. He states that he is having associated symptoms of irritation and redness on the side of the penis. He states that he has not tried any medications for the relief of his symptoms. He denies fever, joint pain, sore throat, genital lesions, and any other symptoms. 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       Patient Active Problem List   Diagnosis Date Noted   Solitary pulmonary nodule 10/26/2014   Cavitary lesion of lung 08/10/2014   Past Medical History  Diagnosis Date   Hyperlipemia    GERD (gastroesophageal reflux disease)    OSA on CPAP    Pneumonia 08/10/2014   Past Surgical History  Procedure Laterality Date   No past surgeries     No Known Allergies Prior to Admission medications   Medication Sig Start Date End Date Taking? Authorizing Provider  omeprazole (PRILOSEC) 20 MG capsule Take 20 mg by mouth daily.   Yes Historical Provider, MD  pravastatin (PRAVACHOL) 40 MG tablet Take 40 mg by mouth daily.   Yes Historical Provider, MD  sildenafil (VIAGRA) 100 MG tablet Take 100 mg by mouth daily as needed for erectile dysfunction.   Yes Historical Provider, MD      Review of Systems  Constitutional: Negative for fever.  HENT: Negative for sore throat.   Genitourinary: Positive for penile pain. Negative for dysuria,  discharge, penile swelling, scrotal swelling, genital sores and testicular pain.  Musculoskeletal: Negative for arthralgias.  Skin: Positive for color change (redness to the side of the shaft).       Objective:   Physical Exam  Constitutional: He is oriented to person, place, and time. He appears well-developed and well-nourished. No distress.  HENT:  Head: Normocephalic and atraumatic.  Eyes: EOM are normal.  Neck: Neck supple. No tracheal deviation present.  Cardiovascular: Normal rate.   Pulmonary/Chest: Effort normal. No respiratory distress.  Genitourinary: Penile erythema present. No discharge found.  Faint erythema on the right side of the shaft of the penis with no vesicles or discharge. Skin is otherwise normal.   Musculoskeletal: Normal range of motion.  Neurological: He is alert and oriented to person, place, and time.  Skin: Skin is warm and dry.  Psychiatric: He has a normal mood and affect. His behavior is normal.  Nursing note and vitals reviewed.        BP 130/84 mmHg   Pulse 84   Temp(Src) 97.6 F (36.4 C) (Oral)   Resp 18   Ht 6\' 3"  (1.905 m)   Wt 297 lb (134.718 kg)   BMI 37.12 kg/m2   SpO2 97%  Assessment & Plan:  DIAGNOSTIC STUDIES: Oxygen Saturation is 97% on RA,  nl by my interpretation.    COORDINATION OF CARE: 10:17 AM-Discussed treatment plan which includes abx Rx, UA, blood work, f/u by Monday if the symptoms have not improved with pt at bedside and pt agreed to plan.      This chart was scribed in my presence and reviewed by me personally.    ICD-9-CM ICD-10-CM   1. Penile pain 607.9 N48.89 POCT urinalysis dipstick     POCT UA - Microscopic Only     GC probe amplification, urine     doxycycline (VIBRA-TABS) 100 MG tablet     HSV(herpes simplex vrs) 1+2 ab-IgG     CANCELED: HSV I/II IGG RFLX I-II TYPE SP   This is a a bit confusing. The exam is relatively normal the patient has had persistent symptoms for a month. This suggests an HSV type  infection but there is no solid evidence for make this diagnosis. We'll await for the lab tests and then if there is no definite diagnosis, refer the patient to either dermatology or urology.  Signed, Robyn Haber, MD

## 2015-04-16 NOTE — Patient Instructions (Signed)
I am expecting improvement or resolution by Monday.  If not, let me know.

## 2015-04-17 LAB — GC/CHLAMYDIA PROBE AMP
CT Probe RNA: NEGATIVE
GC Probe RNA: NEGATIVE

## 2015-04-19 ENCOUNTER — Other Ambulatory Visit: Payer: Self-pay | Admitting: Family Medicine

## 2015-04-19 DIAGNOSIS — A6000 Herpesviral infection of urogenital system, unspecified: Secondary | ICD-10-CM

## 2015-04-19 LAB — HSV(HERPES SIMPLEX VRS) I + II AB-IGG
HSV 1 Glycoprotein G Ab, IgG: 6.87 IV — ABNORMAL HIGH
HSV 2 Glycoprotein G Ab, IgG: 3.83 IV — ABNORMAL HIGH

## 2015-04-19 MED ORDER — VALACYCLOVIR HCL 1 G PO TABS: 1000.0000 mg | ORAL_TABLET | Freq: Two times a day (BID) | ORAL | Status: DC

## 2015-04-19 NOTE — Progress Notes (Signed)
Quick Note:  Called and spoke with patient. Scheduled appointment with TP on 04/21/15. Pt voiced understanding and had no further questions. ______

## 2015-04-20 ENCOUNTER — Telehealth: Payer: Self-pay

## 2015-04-20 ENCOUNTER — Encounter: Payer: Self-pay | Admitting: Family Medicine

## 2015-04-20 NOTE — Telephone Encounter (Signed)
(  See labs). Pt called back. Questions answered

## 2015-04-21 ENCOUNTER — Ambulatory Visit (INDEPENDENT_AMBULATORY_CARE_PROVIDER_SITE_OTHER): Payer: 59 | Admitting: Adult Health

## 2015-04-21 ENCOUNTER — Encounter: Payer: Self-pay | Admitting: Adult Health

## 2015-04-21 VITALS — BP 155/96 | HR 70 | Temp 98.2°F | Ht 74.0 in | Wt 296.0 lb

## 2015-04-21 DIAGNOSIS — J984 Other disorders of lung: Secondary | ICD-10-CM

## 2015-04-21 DIAGNOSIS — R911 Solitary pulmonary nodule: Secondary | ICD-10-CM

## 2015-04-21 NOTE — Assessment & Plan Note (Signed)
Repeat CT chest shows stable lung nodule  Former smoker , repeat CT in 6 months .

## 2015-04-21 NOTE — Progress Notes (Signed)
   Subjective:    Patient ID: Christian Hubbard, male    DOB: Nov 25, 1961, 54 y.o.   MRN: [id number]  HPI  54 y/o male with OSA presented to [facility] ED 9/14 c/o cough, fevers, SOB. CXR showed cavitary lesion in RLL. PCCM asked to see for further eval.  Drinks upto 6-12 beers/d, denies passing out, no dental issues  SIGNIFICANT EVENTS / STUDIES:  08/10/14 CT chest > large cavitary lesion with air fluid level in RLL, 3-4 mm nodule RUL 9/16 CT Neck > no acute findings, cystic mass in R neck near submandibular gland, likely sebaceous cyst, new R pleural fluid collection over the R neck base/ supraclavicular region  Urine 9/14>>>neg AFB 9/14>>>neg  Took Abx x 30 ds  CT chest 09/23/14 -Marked interval improvement , resolved mediastinal LNs , stable RUL nodule  feels much improved, denies fevers, cough OSA on CPAP at home [name]) has lost 20 lbs & prefers to fu with PCP  04/21/2015 Follow up: Lung nodule /lung abscess  Pt returns for 6 month follow up  Pt was treated in 07/2014 with prolonged abx .  Serial CT chest showed marked improvement in 08/2014.  Repeat CT chest 04/16/15 with stable 3-4 mm RUL nodule.  Stable pneumatocele/bullous changes of RLL /RML .  Says he is doing well overall.  No cough, chest pain, orthopnea, edema , wt loss, or hemoptysis .   Remains on CPAP Followed by [name].    Review of Systems  neg for any significant sore throat, dysphagia, itching, sneezing, nasal congestion or excess/ purulent secretions, fever, chills, sweats, unintended wt loss, pleuritic or exertional cp, hempoptysis, orthopnea pnd or change in chronic leg swelling. Also denies presyncope, palpitations, heartburn, abdominal pain, nausea, vomiting, diarrhea or change in bowel or urinary habits, dysuria,hematuria, rash, arthralgias, visual complaints, headache, numbness weakness or ataxia.     Objective:   Physical Exam   Gen. Pleasant, obese, in no distress ENT - no lesions, no post nasal  drip Neck: No JVD, no thyromegaly, no carotid bruits Lungs: no use of accessory muscles, no dullness to percussion, decreased without rales or rhonchi  Cardiovascular: Rhythm regular, heart sounds  normal, no murmurs or gallops, no peripheral edema Musculoskeletal: No deformities, no cyanosis or clubbing , no tremors  CT chest 04/16/15  Stable 3-4 mm noncalcified right upper lobe lung nodule. 2. 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 3. Stable hepatic steatosis.     Assessment & Plan:

## 2015-04-21 NOTE — Patient Instructions (Signed)
Will repeat CT in 6 months .  Follow up with Dr. Elsworth Soho  In 6 months and as needed

## 2015-04-21 NOTE — Assessment & Plan Note (Signed)
CT chest shows stable bullous /pneumatocele in RLL /RML from previous lung abscess.  Repeat ct in 6 months

## 2015-04-24 NOTE — Progress Notes (Signed)
Reviewed & agree with plan  

## 2015-10-25 ENCOUNTER — Inpatient Hospital Stay: Admission: RE | Admit: 2015-10-25 | Payer: 59 | Source: Ambulatory Visit

## 2015-12-06 ENCOUNTER — Inpatient Hospital Stay: Admission: RE | Admit: 2015-12-06 | Payer: 59 | Source: Ambulatory Visit

## 2015-12-09 ENCOUNTER — Ambulatory Visit (INDEPENDENT_AMBULATORY_CARE_PROVIDER_SITE_OTHER)
Admission: RE | Admit: 2015-12-09 | Discharge: 2015-12-09 | Disposition: A | Discharge status: Home / Self Care | Payer: 59 | Source: Ambulatory Visit | Attending: Adult Health | Admitting: Adult Health

## 2015-12-09 ENCOUNTER — Other Ambulatory Visit: Payer: Self-pay | Admitting: Adult Health

## 2015-12-09 DIAGNOSIS — R911 Solitary pulmonary nodule: Secondary | ICD-10-CM

## 2015-12-09 NOTE — Progress Notes (Signed)
Quick Note:  Called and spoke with pt. Reviewed results and recs. Pt voiced understanding and had no further questions. Order placed and on reminder list. ______

## 2015-12-15 ENCOUNTER — Encounter: Payer: Self-pay | Admitting: Adult Health

## 2015-12-15 ENCOUNTER — Ambulatory Visit (INDEPENDENT_AMBULATORY_CARE_PROVIDER_SITE_OTHER): Payer: 59 | Admitting: Adult Health

## 2015-12-15 VITALS — BP 122/78 | HR 70 | Temp 98.0°F | Ht 74.0 in | Wt 317.0 lb

## 2015-12-15 DIAGNOSIS — R911 Solitary pulmonary nodule: Secondary | ICD-10-CM | POA: Diagnosis not present

## 2015-12-15 DIAGNOSIS — J984 Other disorders of lung: Secondary | ICD-10-CM

## 2015-12-15 NOTE — Assessment & Plan Note (Signed)
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

## 2015-12-15 NOTE — Addendum Note (Signed)
Addended by: Osa Craver on: 12/15/2015 10:25 AM   Modules accepted: Orders

## 2015-12-15 NOTE — Assessment & Plan Note (Signed)
Stable on CT chest  Repeat CT in 1 year for serial follow up

## 2015-12-15 NOTE — Progress Notes (Signed)
   Subjective:    Patient ID: Christian Hubbard, male    DOB: Aug 12, 1961, 55 y.o.   MRN: [id number]  HPI  55 y/o male with OSA presented to [facility] ED 9/14 c/o cough, fevers, SOB. CXR showed cavitary lesion in RLL. PCCM asked to see for further eval.  Drinks upto 6-12 beers/d, denies passing out, no dental issues  SIGNIFICANT EVENTS / STUDIES:  08/10/14 CT chest > large cavitary lesion with air fluid level in RLL, 3-4 mm nodule RUL 9/16 CT Neck > no acute findings, cystic mass in R neck near submandibular gland, likely sebaceous cyst, new R pleural fluid collection over the R neck base/ supraclavicular region  Urine 9/14>>>neg AFB 9/14>>>neg  Took Abx x 30 ds  CT chest 09/23/14 -Marked interval improvement , resolved mediastinal LNs , stable RUL nodule  feels much improved, denies fevers, cough OSA on CPAP at home [name]) has lost 20 lbs & prefers to fu with PCP  12/15/2015 Follow up: Lung nodule /lung abscess  Pt returns for 6 month follow up  Pt was treated in 07/2014 with prolonged abx for lung abscess.   Serial CT chest showed marked improvement in 08/2014.  Repeat CT chest 04/16/15 with stable 3-4 mm RUL nodule.  Stable pneumatocele/bullous changes of RLL /RML .  CT chest done 12/09/15 with stable 3 mm RUL nodule and RLL pneumatocele. He is a former smoker, discussed repeat CT chest in 1year.  Says he is doing well overall.  No cough, chest pain, orthopnea, edema , wt loss, or hemoptysis .   Remains on CPAP Followed by [name].    Review of Systems  neg for any significant sore throat, dysphagia, itching, sneezing, nasal congestion or excess/ purulent secretions, fever, chills, sweats, unintended wt loss, pleuritic or exertional cp, hempoptysis, orthopnea pnd or change in chronic leg swelling. Also denies presyncope, palpitations, heartburn, abdominal pain, nausea, vomiting, diarrhea or change in bowel or urinary habits, dysuria,hematuria, rash, arthralgias, visual complaints,  headache, numbness weakness or ataxia.     Objective:   Physical Exam   Gen. Pleasant, obese, in no distress ENT - no lesions, no post nasal drip Neck: No JVD, no thyromegaly, no carotid bruits Lungs: no use of accessory muscles, no dullness to percussion, decreased without rales or rhonchi  Cardiovascular: Rhythm regular, heart sounds  normal, no murmurs or gallops, no peripheral edema Musculoskeletal: No deformities, no cyanosis or clubbing , no tremors  CT chest 12/09/15  Reviewed independently Stable thin-walled cavitary lesion (pneumatocele) in the RIGHT lower lobe. 2. Stable small pulmonary nodule in RIGHT upper lobe.     Assessment & Plan:

## 2015-12-15 NOTE — Patient Instructions (Signed)
Will repeat CT in  1 year .  Follow up with Dr. Elsworth Soho  In 1 year and as needed

## 2015-12-15 NOTE — Progress Notes (Signed)
Reviewed & agree with plan  

## 2016-12-14 ENCOUNTER — Inpatient Hospital Stay: Admission: RE | Admit: 2016-12-14 | Payer: 59 | Source: Ambulatory Visit

## 2016-12-18 ENCOUNTER — Ambulatory Visit (INDEPENDENT_AMBULATORY_CARE_PROVIDER_SITE_OTHER)
Admission: RE | Admit: 2016-12-18 | Discharge: 2016-12-18 | Disposition: A | Discharge status: Home / Self Care | Payer: 59 | Source: Ambulatory Visit | Attending: Adult Health | Admitting: Adult Health

## 2016-12-18 DIAGNOSIS — R911 Solitary pulmonary nodule: Secondary | ICD-10-CM | POA: Diagnosis not present

## 2016-12-18 DIAGNOSIS — R918 Other nonspecific abnormal finding of lung field: Secondary | ICD-10-CM | POA: Diagnosis not present

## 2017-01-02 ENCOUNTER — Ambulatory Visit: Payer: 59 | Admitting: Pulmonary Disease

## 2017-01-08 ENCOUNTER — Telehealth: Payer: Self-pay | Admitting: Adult Health

## 2017-01-08 NOTE — Telephone Encounter (Signed)
Patient is at work, okay to leave message.

## 2017-01-08 NOTE — Telephone Encounter (Signed)
Spoke with pt and notified of results per TP. Pt verbalized understanding and denied any questions. 

## 2017-01-08 NOTE — Telephone Encounter (Signed)
Notes Recorded by Melvenia Needles, NP on 12/22/2016 at 2:03 PM EST RLL cystic changes/konwn pneumatocele - appears w/ some surround inflammation  Will repeat CT chest in 6 months .   Called and lmom for the pt about his ct results per pts request. Nothing further is needed.

## 2017-01-08 NOTE — Progress Notes (Signed)
Called patient and left message to call back for medical results.

## 2017-01-30 ENCOUNTER — Ambulatory Visit (INDEPENDENT_AMBULATORY_CARE_PROVIDER_SITE_OTHER): Payer: 59 | Admitting: Pulmonary Disease

## 2017-01-30 ENCOUNTER — Encounter: Payer: Self-pay | Admitting: Pulmonary Disease

## 2017-01-30 DIAGNOSIS — J984 Other disorders of lung: Secondary | ICD-10-CM

## 2017-01-30 DIAGNOSIS — R911 Solitary pulmonary nodule: Secondary | ICD-10-CM | POA: Diagnosis not present

## 2017-01-30 NOTE — Patient Instructions (Signed)
The nodule in the right upper lung has remained stable for 4 years and is benign  The pneumonia that she had in 2014 is healed with cyst formation in your right lower lung is stable Suggest chest x-ray in one year with PCP Follow-up with [place] as needed

## 2017-01-30 NOTE — Assessment & Plan Note (Signed)
The pneumonia that he had in 2014 is healed with cyst formation in your right lower lung is stable Suggest chest x-ray in one year with PCP Follow-up with [place] as needed

## 2017-01-30 NOTE — Assessment & Plan Note (Signed)
The nodule in the right upper lung has remained stable for 4 years and is benign

## 2017-01-30 NOTE — Progress Notes (Signed)
   Subjective:    Patient ID: Christian Hubbard, male    DOB: [birth year], 56 y.o.   MRN: [id number]  HPI  56 y/o former smoker with OSA For follow-up of right upper lobe nodule  He presented in 07/2013 with a right lower lobe cavitary pneumonia . This resolved with pneumatocele formation in the right lower lobe  He remains asymptomatic, we reviewed CT images from 2018 He quit smoking in 2006, works in a [employer]  SIGNIFICANT EVENTS / STUDIES:  CT 2014 - RLL pna + AF level , large 10cm CT chest 04/16/15 with stable 3-4 mm RUL nodule.  CT chest done 12/09/15 with stable 3 mm RUL nodule and RLL pneumatocele.    Review of Systems Patient denies significant dyspnea,cough, hemoptysis,  chest pain, palpitations, pedal edema, orthopnea, paroxysmal nocturnal dyspnea, lightheadedness, nausea, vomiting, abdominal or  leg pains      Objective:   Physical Exam  Gen. Pleasant, obese, in no distress ENT - left jaw mobile mass ? Lipoma (chronic), no post nasal drip Neck: No JVD, no thyromegaly, no carotid bruits Lungs: no use of accessory muscles, no dullness to percussion, decreased without rales or rhonchi  Cardiovascular: Rhythm regular, heart sounds  normal, no murmurs or gallops, no peripheral edema Musculoskeletal: No deformities, no cyanosis or clubbing , no tremors       Assessment & Plan:

## 2017-04-09 DIAGNOSIS — I10 Essential (primary) hypertension: Secondary | ICD-10-CM | POA: Diagnosis not present

## 2017-04-09 DIAGNOSIS — R7301 Impaired fasting glucose: Secondary | ICD-10-CM | POA: Diagnosis not present

## 2017-04-09 DIAGNOSIS — E782 Mixed hyperlipidemia: Secondary | ICD-10-CM | POA: Diagnosis not present

## 2017-04-09 DIAGNOSIS — Z0000 Encounter for general adult medical examination without abnormal findings: Secondary | ICD-10-CM | POA: Diagnosis not present

## 2017-04-09 DIAGNOSIS — K219 Gastro-esophageal reflux disease without esophagitis: Secondary | ICD-10-CM | POA: Diagnosis not present

## 2017-04-09 DIAGNOSIS — Z125 Encounter for screening for malignant neoplasm of prostate: Secondary | ICD-10-CM | POA: Diagnosis not present

## 2017-05-22 ENCOUNTER — Ambulatory Visit
Admission: RE | Admit: 2017-05-22 | Discharge: 2017-05-22 | Disposition: A | Discharge status: Home / Self Care | Payer: 59 | Source: Ambulatory Visit | Attending: Family Medicine | Admitting: Family Medicine

## 2017-05-22 ENCOUNTER — Other Ambulatory Visit: Payer: Self-pay | Admitting: Family Medicine

## 2017-05-22 DIAGNOSIS — M79641 Pain in right hand: Secondary | ICD-10-CM | POA: Diagnosis not present

## 2017-05-22 DIAGNOSIS — J189 Pneumonia, unspecified organism: Secondary | ICD-10-CM

## 2017-05-22 DIAGNOSIS — R0789 Other chest pain: Secondary | ICD-10-CM

## 2017-05-22 DIAGNOSIS — M25531 Pain in right wrist: Secondary | ICD-10-CM

## 2017-05-22 DIAGNOSIS — R0602 Shortness of breath: Secondary | ICD-10-CM

## 2017-05-22 DIAGNOSIS — M79644 Pain in right finger(s): Secondary | ICD-10-CM

## 2017-05-22 DIAGNOSIS — R899 Unspecified abnormal finding in specimens from other organs, systems and tissues: Secondary | ICD-10-CM

## 2017-05-22 DIAGNOSIS — R079 Chest pain, unspecified: Secondary | ICD-10-CM | POA: Diagnosis not present

## 2017-05-22 DIAGNOSIS — R918 Other nonspecific abnormal finding of lung field: Secondary | ICD-10-CM | POA: Diagnosis not present

## 2017-05-22 MED ORDER — IOPAMIDOL (ISOVUE-370) INJECTION 76%
100.0000 mL | Freq: Once | INTRAVENOUS | Status: AC | PRN
  Administered 2017-05-22: 100 mL via INTRAVENOUS

## 2017-06-08 ENCOUNTER — Ambulatory Visit
Admission: RE | Admit: 2017-06-08 | Discharge: 2017-06-08 | Disposition: A | Discharge status: Home / Self Care | Payer: 59 | Source: Ambulatory Visit | Attending: Family Medicine | Admitting: Family Medicine

## 2017-06-08 ENCOUNTER — Other Ambulatory Visit: Payer: Self-pay | Admitting: Family Medicine

## 2017-06-08 DIAGNOSIS — R079 Chest pain, unspecified: Secondary | ICD-10-CM | POA: Diagnosis not present

## 2017-06-08 DIAGNOSIS — J189 Pneumonia, unspecified organism: Secondary | ICD-10-CM

## 2017-06-11 ENCOUNTER — Ambulatory Visit (INDEPENDENT_AMBULATORY_CARE_PROVIDER_SITE_OTHER): Payer: 59 | Admitting: Acute Care

## 2017-06-11 ENCOUNTER — Other Ambulatory Visit (INDEPENDENT_AMBULATORY_CARE_PROVIDER_SITE_OTHER): Payer: 59

## 2017-06-11 ENCOUNTER — Encounter: Payer: Self-pay | Admitting: Acute Care

## 2017-06-11 VITALS — BP 118/74 | HR 82 | Ht 74.0 in | Wt 310.0 lb

## 2017-06-11 DIAGNOSIS — J189 Pneumonia, unspecified organism: Secondary | ICD-10-CM | POA: Diagnosis not present

## 2017-06-11 DIAGNOSIS — J984 Other disorders of lung: Secondary | ICD-10-CM

## 2017-06-11 LAB — BASIC METABOLIC PANEL
BUN: 11 mg/dL (ref 6–23)
CALCIUM: 9.4 mg/dL (ref 8.4–10.5)
CO2: 28 meq/L (ref 19–32)
CREATININE: 0.91 mg/dL (ref 0.40–1.50)
Chloride: 97 mEq/L (ref 96–112)
GFR: 111.81 mL/min (ref >60.00)
GLUCOSE: 108 mg/dL — AB (ref 70–99)
Potassium: 3.8 mEq/L (ref 3.5–5.1)
Sodium: 135 mEq/L (ref 135–145)

## 2017-06-11 LAB — CBC WITH DIFFERENTIAL/PLATELET
BASOS ABS: 0.1 10*3/uL (ref 0.0–0.1)
Basophils Relative: 0.9 % (ref 0.0–3.0)
EOS ABS: 0.6 10*3/uL (ref 0.0–0.7)
Eosinophils Relative: 5.8 % — ABNORMAL HIGH (ref 0.0–5.0)
HCT: 40.5 % (ref 39.0–52.0)
Hemoglobin: 13.3 g/dL (ref 13.0–17.0)
LYMPHS ABS: 0.9 10*3/uL (ref 0.7–4.0)
Lymphocytes Relative: 9 % — ABNORMAL LOW (ref 12.0–46.0)
MCHC: 32.9 g/dL (ref 30.0–36.0)
MCV: 74.9 fl — AB (ref 78.0–100.0)
Monocytes Absolute: 0.9 10*3/uL (ref 0.1–1.0)
Monocytes Relative: 9.5 % (ref 3.0–12.0)
NEUTROS ABS: 7.3 10*3/uL (ref 1.4–7.7)
NEUTROS PCT: 74.8 % (ref 43.0–77.0)
PLATELETS: 348 10*3/uL (ref 150.0–400.0)
RBC: 5.4 Mil/uL (ref 4.22–5.81)
RDW: 15.5 % (ref 11.5–15.5)
WBC: 9.8 10*3/uL (ref 4.0–10.5)

## 2017-06-11 LAB — SEDIMENTATION RATE: Sed Rate: 97 mm/hr — ABNORMAL HIGH (ref 0–20)

## 2017-06-11 NOTE — Patient Instructions (Addendum)
It is nice to meet you today. We will send sputum for culture, AFB, and fungus. Complete Levaquin until gone. Follow up in 10 days with Dr. Melvyn Novas with CXR prior. Mucinex 1200 mg once daily with a full glass of water for chest congestion. BMET,CBC with Diff, Sed rate, Quantiferon Gold , C  ANCA profile. Please contact office for sooner follow up if symptoms do not improve or worsen or seek emergency care

## 2017-06-11 NOTE — Progress Notes (Signed)
History of Present Illness Christian Hubbard is a 56 y.o. male former smoker with OSA and pulmonary nodules. He presents today for slow to resolve community-acquired pneumonia. He is followed by Dr Elsworth Soho   06/11/2017 Follow up: Pt. Presents today for follow up of newly diagnosed pneumonia.This was diagnosed by Dr. Cyndra Numbers .His diagnosis was made on  05/22/2017.He states he has been treated with 4 courses of Levaquin. Last CXR 06/08/2017 showed continued infiltrate.He states that he has lost about 30 pounds in the last month.He states he is nauseated. He states he has no energy.He is a former smoker. He quit in 2006. He patient works at a [employer]. He is exposed to multiple chemicals. He does wear protective equipment.He states he is not having fever . He states he was having some chest pain with cough upon inspiration. He does not have a productive cough, and states that there has been very little expectorated during this process. He denies significant shortness of breath.He denies fever, orthopnea , or hemoptysis.He is compliant with his CPAP use. CPAP is managed by Dr. Alyson Ingles. He states he is careful to clean his CPAP machine with soapy water weekly. He states he has not had any issues with swallow that he is aware of. Patient states he has not had any sick contacts. Most recent HIV 03/05/2015 was non reactive.  Test Results: Sed Rate 06/11/2017>> 97 WBC 7/16>> 9.8 HGB 7/16>> 13.3 Lymphocytes 7/16>> 9.0 Eosinophils 7/16 >> 5.8  Sodium 7/16>> 135 Potassium 7/16>> 3.8 Glucose 7/16>> 108 Creatinine 7/16>> 0.91 Calcium 7/16>> 9.4 GFR 7/16>> 111.81 Quantiferon  Gold 7/16>> Pending C ANCA Profile 7/16>> Pending   CXR 06/08/2017 IMPRESSION: 1. Increased opacity in the right mid lung reason, compatible with previously described pneumonia. 2. No other interval change, as detailed above.  CXR 05/22/2017 Patchy interstitial and alveolar opacities in the mid and lower lungs new since the  previous study. Small bilateral pleural effusions. The findings are worrisome for pneumonia but malignancy is not excluded. A previously demonstrated cystic lesion in the right lower lobe is not clearly evident. Repeat chest CT scanning now is recommended.  CTA Chest: 05/22/2017  Despite the limitations of today's examination there is no evidence to suggest clinically relevant central, lobar or segmental sized pulmonary embolism.  Widespread multifocal airspace consolidation throughout the lungs bilaterally compatible with severe multilobar bronchopneumonia, with evidence of early cavitation in some regions.  Trace right pleural effusion.  Probable reactive mediastinal and hilar lymphadenopathy.  Severe hepatic steatosis.  Aortic atherosclerosis, in addition to left main and 2 vessel coronary artery disease.  EVENTS / STUDIES:  CT 2014 - RLL pna + AF level , large 10cm CT chest 04/16/15 with stable 3-4 mm RUL nodule.  CT chest done 12/09/15 with stable 3 mm RUL nodule and RLL pneumatocele.    CBC Latest Ref Rng & Units 06/11/2017 08/14/2014 08/12/2014  WBC 4.0 - 10.5 K/uL 9.8 10.8(H) 16.2(H)  Hemoglobin 13.0 - 17.0 g/dL 13.3 11.9(L) 12.5(L)  Hematocrit 39.0 - 52.0 % 40.5 36.9(L) 39.1  Platelets 150.0 - 400.0 K/uL 348.0 335 294    BMP Latest Ref Rng & Units 06/11/2017 08/14/2014 08/12/2014  Glucose 70 - 99 mg/dL 108(H) 106(H) 107(H)  BUN 6 - 23 mg/dL 11 10 10   Creatinine 0.40 - 1.50 mg/dL 0.91 0.99 1.06  Sodium 135 - 145 mEq/L 135 137 142  Potassium 3.5 - 5.1 mEq/L 3.8 4.2 3.9  Chloride 96 - 112 mEq/L 97 99 102  CO2 19 -  32 mEq/L 28 26 24   Calcium 8.4 - 10.5 mg/dL 9.4 8.8 8.7     Dg Chest 2 View  Result Date: 06/08/2017 CLINICAL DATA:  Pt states he is having bilateral chest pain since he was diagnosed with pneumonia 4 weeks ago. Pt has no other chest complaints at this time. PT Hx: HTN, ex smoker 2006 EXAM: CHEST  2 VIEW COMPARISON:  None. FINDINGS: Increased opacity within  the right mid lung region, compatible with previously described pneumonia. Additional patchy airspace opacities bilaterally are not significantly changed compared to the previous exam. Probable small bilateral pleural effusions. Heart size and mediastinal contours are normal. IMPRESSION: 1. Increased opacity in the right mid lung reason, compatible with previously described pneumonia. 2. No other interval change, as detailed above. Electronically Signed   By: Franki Cabot M.D.   On: 06/08/2017 13:44   Dg Chest 2 View  Result Date: 05/22/2017 CLINICAL DATA:  Chest pain, shortness of breath. No fever. Former smoker. EXAM: CHEST  2 VIEW COMPARISON:  CT scan of the chest of December 18, 2016. FINDINGS: The lungs are adequately inflated. The interstitial markings are coarse bilaterally predominantly in the mid and lower lungs. The cardiac silhouette is normal in size. The pulmonary vascularity is not engorged. The trachea is midline. Small amounts of pleural fluid blunt the costophrenic angles. The bony thorax exhibits no acute abnormality. IMPRESSION: Patchy interstitial and alveolar opacities in the mid and lower lungs new since the previous study. Small bilateral pleural effusions. The findings are worrisome for pneumonia but malignancy is not excluded. A previously demonstrated cystic lesion in the right lower lobe is not clearly evident. Repeat chest CT scanning now is recommended. These results will be called to the ordering clinician or representative by the Radiologist Assistant, and communication documented in the PACS or zVision Dashboard. Electronically Signed   By: David  Martinique M.D.   On: 05/22/2017 11:16   Ct Angio Chest Pe W Or Wo Contrast  Result Date: 05/22/2017 CLINICAL DATA:  56 year old male with history of right upper chest pain with deep inspiration for the past 4 days. Possible pneumonia. EXAM: CT ANGIOGRAPHY CHEST WITH CONTRAST TECHNIQUE: Multidetector CT imaging of the chest was performed  using the standard protocol during bolus administration of intravenous contrast. Multiplanar CT image reconstructions and MIPs were obtained to evaluate the vascular anatomy. CONTRAST:  100 mL of Isovue 370. COMPARISON:  Chest CT 12/18/2016 FINDINGS: Cardiovascular: Despite the suboptimal contrast bolus there is no filling defect within the central, lobar or segmental sized pulmonary tail tree to suggest clinically relevant pulmonary embolism. Smaller subsegmental sized pulmonary embolus cannot be entirely excluded. Heart size is normal. There is no significant pericardial fluid, thickening or pericardial calcification. There is aortic atherosclerosis, as well as atherosclerosis of the great vessels of the mediastinum and the coronary arteries, including calcified atherosclerotic plaque in the left main, left anterior descending and right coronary arteries. Mediastinum/Nodes: Multiple enlarged and borderline enlarged mediastinal and bilateral hilar lymph nodes, likely reactive. The largest of these measures up to 15 mm in short axis in the low right paratracheal nodal station. Esophagus is unremarkable in appearance. No axillary lymphadenopathy. Lungs/Pleura: Multifocal patchy areas of ill-defined airspace consolidation, ground-glass attenuation and peribronchovascular micro and macronodularity scattered throughout all aspects of the lungs bilaterally, compatible with widespread bronchopneumonia. There is a more confluent area of consolidation in the lateral aspect of the superior segment of the right lower lobe in an area that was previously partially cavitary on prior chest  CT 12/18/2016. Some of the patchy areas of airspace consolidation have central lucency, which could suggest developing areas of cavitation. This is best appreciated in the left lower lobe on image 40 of series 12. Trace right pleural effusion. No definite left pleural effusion. Upper Abdomen: Diffuse low attenuation throughout the visualized  hepatic parenchyma, compatible with severe hepatic steatosis. Colonic diverticulosis in the region of the splenic flexure. Musculoskeletal: There are no aggressive appearing lytic or blastic lesions noted in the visualized portions of the skeleton. Review of the MIP images confirms the above findings. IMPRESSION: 1. Despite the limitations of today's examination there is no evidence to suggest clinically relevant central, lobar or segmental sized pulmonary embolism. 2. Widespread multifocal airspace consolidation throughout the lungs bilaterally compatible with severe multilobar bronchopneumonia, with evidence of early cavitation in some regions. 3. Trace right pleural effusion. 4. Probable reactive mediastinal and hilar lymphadenopathy. 5. Severe hepatic steatosis. 6. Aortic atherosclerosis, in addition to left main and 2 vessel coronary artery disease. Please note that although the presence of coronary artery calcium documents the presence of coronary artery disease, the severity of this disease and any potential stenosis cannot be assessed on this non-gated CT examination. Assessment for potential risk factor modification, dietary therapy or pharmacologic therapy may be warranted, if clinically indicated. Aortic Atherosclerosis (ICD10-I70.0). Electronically Signed   By: Vinnie Langton M.D.   On: 05/22/2017 16:29   Dg Hand Complete Right  Result Date: 05/22/2017 CLINICAL DATA:  Two week history of pain overlying the radial styloid. No known injury. EXAM: RIGHT HAND - COMPLETE 3+ VIEW COMPARISON:  None in PACs FINDINGS: The bones are subjectively adequately mineralized. There is no acute or healing fracture. There is no lytic nor blastic lesion. The joint spaces are well maintained. Specific attention to the radial styloid reveals no abnormality. The soft tissues of the hand are normal. IMPRESSION: There is no acute or significant chronic bony abnormality of the right hand. Specific attention to the radial  styloid reveals no radiographic abnormality. If the patient's symptoms persist and warrant further imaging, MRI would be the most useful next imaging step. Electronically Signed   By: David  Martinique M.D.   On: 05/22/2017 11:18     Past medical hx Past Medical History:  Diagnosis Date  . GERD (gastroesophageal reflux disease)   . Hyperlipemia   . OSA on CPAP   . Pneumonia 08/10/2014     Social History  Substance Use Topics  . Smoking status: Former Smoker    Packs/day: 1.00    Years: 24.00    Types: Cigarettes    Quit date: 11/27/2004  . Smokeless tobacco: Never Used  . Alcohol use No     Comment: 08/10/2014 "I drink 6-7 12 oz beers a few days/wk; probably 24/wk"    Tobacco Cessation: Former smoker, quit 2006 with a 24-pack-year smoking history  Past surgical hx, Family hx, Social hx all reviewed.  Current Outpatient Prescriptions on File Prior to Visit  Medication Sig  . amLODipine (NORVASC) 5 MG tablet Take 1 tablet by mouth daily.  Marland Kitchen losartan-hydrochlorothiazide (HYZAAR) 100-12.5 MG tablet Take 1 tablet by mouth daily.  Marland Kitchen omeprazole (PRILOSEC) 20 MG capsule Take 20 mg by mouth daily.  . pravastatin (PRAVACHOL) 40 MG tablet Take 40 mg by mouth daily.  . sildenafil (VIAGRA) 100 MG tablet Take 100 mg by mouth daily as needed for erectile dysfunction.   No current facility-administered medications on file prior to visit.      No Known  Allergies  Review Of Systems:  Constitutional:   +  weight loss, no night sweats,  Fevers, chills, +fatigue, or  lassitude.  HEENT:   No headaches,  Difficulty swallowing,  Tooth/dental problems, or  Sore throat,                No sneezing, itching, ear ache, +nasal congestion, post nasal drip,   CV:  + chest pain with inspiration,  No Orthopnea, PND, swelling in lower extremities, anasarca, dizziness, palpitations, syncope.   GI  No heartburn, indigestion, abdominal pain, nausea, vomiting, diarrhea, change in bowel habits, loss of appetite,  bloody stools.   Resp: + shortness of breath with exertion not  at rest.  No excess mucus, no productive cough,  + non-productive cough,  No coughing up of blood.  No change in color of mucus.  + wheezing.  No chest wall deformity  Skin: no rash or lesions.  GU: no dysuria, change in color of urine, no urgency or frequency.  No flank pain, no hematuria   MS:  No joint pain or swelling.  No decreased range of motion.  No back pain.  Psych:  No change in mood or affect. No depression or anxiety.  No memory loss.   Vital Signs BP 118/74 (BP Location: Left Arm, Patient Position: Sitting, Cuff Size: Normal)   Pulse 82   Ht 6\' 2"  (1.88 m)   Wt (!) 310 lb (140.6 kg)   SpO2 99%   BMI 39.80 kg/m    Physical Exam:  General- No distress,  A&Ox3, pleasant ENT: No sinus tenderness, TM clear, pale nasal mucosa, no oral exudate,no post nasal drip, no LAN Cardiac: S1, S2, regular rate and rhythm, no murmur Chest: Faint exp.  Wheeze/No  rales/ dullness; no accessory muscle use, no nasal flaring, no sternal retractions, breath sounds diminished in bases Abd.: Soft Non-tender, nondistended, bowel sounds positive, obese Ext: No clubbing cyanosis, edema Neuro:  normal strength, but states he is fatigued Skin: No rashes, warm and dry, no lesions Psych: normal mood and behavior, appropriately concerned   Assessment/Plan  CAP (community acquired pneumonia) Slow to resolve pneumonia First diagnosed 05/22/2017 4 cycles of Levaquin with imaging confirming continued infiltrate in right lower lobe Significant weight loss Plan We will send sputum for culture, AFB, and fungus. Complete Levaquin until gone. Follow up in 10 days with Dr. Melvyn Novas with CXR prior.( Dr. Elsworth Soho has no availability until October 2018.) Mucinex 1200 mg once daily with a full glass of water for chest congestion. BMET,CBC with Diff, Sed rate, Quantiferon Gold , C  ANCA profile. Please contact office for sooner follow up if  symptoms do not improve or worsen or seek emergency care  Consider bronchoscopy if above is non diagnostic Consider follow-up CT scan if above is not diagnostic Consider ID consult Consider swallow study     Magdalen Spatz, NP 06/11/2017  3:42 PM

## 2017-06-11 NOTE — Assessment & Plan Note (Addendum)
Slow to resolve pneumonia First diagnosed 05/22/2017 4 cycles of Levaquin with imaging confirming continued infiltrate in right lower lobe Significant weight loss Plan We will send sputum for culture, AFB, and fungus. Complete Levaquin until gone. Follow up in 10 days with Dr. Melvyn Novas with CXR prior.( Dr. Elsworth Soho has no availability until October 2018.) Mucinex 1200 mg once daily with a full glass of water for chest congestion. BMET,CBC with Diff, Sed rate, Quantiferon Gold , C  ANCA profile. Please contact office for sooner follow up if symptoms do not improve or worsen or seek emergency care  Consider bronchoscopy if above is non diagnostic Consider follow-up CT scan if above is not diagnostic Consider ID consult Consider swallow study

## 2017-06-12 ENCOUNTER — Telehealth: Payer: Self-pay | Admitting: Acute Care

## 2017-06-12 DIAGNOSIS — G4733 Obstructive sleep apnea (adult) (pediatric): Secondary | ICD-10-CM | POA: Diagnosis not present

## 2017-06-12 LAB — ANCA SCREEN W REFLEX TITER: ANCA Screen: NEGATIVE

## 2017-06-12 NOTE — Telephone Encounter (Signed)
Patient returned call.  Scheduled appt for 06/20/2017 at 1:30 and patient knows to arrive at 1:15 and have x-ray before.    At end of conversation patient states he is experiencing a lot of fatigue and feels like he is getting worse and doesn't know if he should wait til 06/20/2017 or if Dr. Melvyn Novas wants to see him sooner.  Nothing available on Dr. Gustavus Bryant schedule.  Patient's CB is [phone].

## 2017-06-12 NOTE — Telephone Encounter (Signed)
Spoke with patient. He stated that he finished his last dose of Levaquin 750 this morning. He states that he is still fatigued and not like himself. He wanted to know if he needed an sooner appt or another round of antibiotics.   Spoke with Judson Roch. She advised that he come in sooner. Appt has been made with Brandi tomorrow at 1030. He verbalized understanding. Nothing else needed at time of call.

## 2017-06-12 NOTE — Telephone Encounter (Signed)
Left a message for patient to call back. Per SG, she wanted the patient to follow up in 10days with MW. She discussed this with MW before he left the clinic. MW did not have any openings but I spoke with Magda Paganini who stated it was ok for [place] to double book him on July 25th at 130 and to remind him to get here at 115. He will also need a CXR prior to his visit.   Will await his call before scheduling him and placing the order for the CXR.

## 2017-06-13 ENCOUNTER — Encounter: Payer: Self-pay | Admitting: Pulmonary Disease

## 2017-06-13 ENCOUNTER — Ambulatory Visit (INDEPENDENT_AMBULATORY_CARE_PROVIDER_SITE_OTHER): Payer: 59 | Admitting: Pulmonary Disease

## 2017-06-13 ENCOUNTER — Other Ambulatory Visit (INDEPENDENT_AMBULATORY_CARE_PROVIDER_SITE_OTHER): Payer: 59

## 2017-06-13 VITALS — BP 118/74 | HR 89 | Temp 98.6°F | Ht 74.0 in | Wt 312.0 lb

## 2017-06-13 DIAGNOSIS — J181 Lobar pneumonia, unspecified organism: Secondary | ICD-10-CM | POA: Diagnosis not present

## 2017-06-13 LAB — URINALYSIS
Bilirubin Urine: NEGATIVE
Hgb urine dipstick: NEGATIVE
Ketones, ur: NEGATIVE
LEUKOCYTES UA: NEGATIVE
Nitrite: NEGATIVE
SPECIFIC GRAVITY, URINE: 1.01 (ref 1.000–1.030)
Total Protein, Urine: NEGATIVE
Urine Glucose: NEGATIVE
pH: 7 (ref 5.0–8.0)

## 2017-06-13 LAB — QUANTIFERON TB GOLD ASSAY (BLOOD)
Interferon Gamma Release Assay: NEGATIVE
MITOGEN-NIL SO: 8.01 [IU]/mL
QUANTIFERON NIL VALUE: 0.17 [IU]/mL
Quantiferon Tb Ag Minus Nil Value: <0 IU/mL

## 2017-06-13 NOTE — Progress Notes (Addendum)
[name]   Chief Complaint  Patient presents with  . Follow-up    Fatigue has increased. Nightly body sweats. SOB has stayed the same since Monday's visit.      Primary Pulmonologist: Dr. Elsworth Soho  Current Outpatient Prescriptions on File Prior to Visit  Medication Sig  . amLODipine (NORVASC) 5 MG tablet Take 1 tablet by mouth daily.  Marland Kitchen losartan-hydrochlorothiazide (HYZAAR) 100-12.5 MG tablet Take 1 tablet by mouth daily.  . metFORMIN (GLUCOPHAGE) 500 MG tablet   . omeprazole (PRILOSEC) 20 MG capsule Take 20 mg by mouth daily.  . pravastatin (PRAVACHOL) 40 MG tablet Take 40 mg by mouth daily.  . sildenafil (VIAGRA) 100 MG tablet Take 100 mg by mouth daily as needed for erectile dysfunction.   No current facility-administered medications on file prior to visit.      Studies:  6/26  CTA Chest >> neg for PE, widespread multifocal airspace consolidation compatible with severe multilobar PNA with evidence of early cavitation in some regions, trace right pleural effusion, severe hepatic steatosis, 2 vessel CAD  Past Medical Hx:  has a past medical history of GERD (gastroesophageal reflux disease); Hyperlipemia; OSA on CPAP; and Pneumonia (08/10/2014).   Past Surgical hx, Allergies, Family hx, Social hx all reviewed.  Vital Signs BP 118/74 (BP Location: Left Arm, Patient Position: Sitting, Cuff Size: Normal)   Pulse 89   Temp 98.6 F (37 C) (Oral)   Ht '6\' 2"'  (1.88 m)   Wt (!) 312 lb (141.5 kg)   SpO2 96%   BMI 40.06 kg/m   History of Present Illness Christian Hubbard is a 56 y.o. male with a history of solitary pulmonary nodule, OSA on CPAP, prior PNA and pneumatocele who presented to the pulmonary office for follow up of PNA.    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  He was initially seen by his PCP on 6/26 with new onset R sided chest pain, fatigue and  SOB.  He had also lost ~ 30 lbs.  He was treated with Levaquin (he thinks 4 full weeks).  Given symptoms a CTA of the chest was ordered on 6/26 which was neg for PE, widespread multifocal airspace consolidation compatible with severe multilobar PNA with evidence of early cavitation in some regions, trace right pleural effusion, severe hepatic steatosis, 2 vessel CAD.    He saw his PCP on 7/13 and had a CXR which showed increased opacity in the right mid lung region.    He followed up with Pulmonary on 7/16 and reported ongoing fatigue and SOB.  Quantiferon gold assessed and negative, ANCA negative, WBC 9.8, Hgb 13.3, Sed rate 97.  HIV testing negative in 02/2015).     He returns on 7/18 for follow up of labs and continues to feel fatigues.  He states he has difficult time at work completing tasks and tires easily.  The patient reports he completed antibiotics as prescribed.    Currently, he denies further chest pain, broken teeth, aspiration events, ETOH/ drug use.  He works 12 hour shifts that rotate days/nights.   He reports dark urine and decreased output.    Physical Exam  General - well developed adult M in no acute distress ENT - No sinus tenderness, no oral exudate, no LAN, no broken teeth but multiple dark areas with fillings Cardiac - s1s2 regular, no murmur Chest - even/non-labored, lungs  bilaterally clear. No wheeze/rales Back - No focal tenderness Abd - Soft, non-tender Ext - No edema Neuro - Normal strength Skin - No rashes Psych - normal mood, and behavior   Assessment/Plan  Discussion:  56 y/o M with recent PNA (multifocal patchy opacities, GGO concerning for PNA, confluent consolidation in the lateral aspect of the RLL, concern for early cavitation in some of the patchy areas).  He states he has taken Levaquin as prescribed.  On exam, he is non-toxic appearing and afebrile.  He states he does feel some better but his energy levels are not back to baseline.    PNA -  multifocal on 6/26 CT chest, slow to resolve   Plan: Hold further antibiotics for now Will review images with Dr. Elsworth Soho and discuss FOB vs repeat CT w/o contrast of the chest Assess urine for strep antigen and urinalysis May need empiric steroids given elevated ESR  Patient Instructions  1.  I will have Dr. Elsworth Soho review your films and we will be in touch regarding possible bronchoscopy (if needed) vs a repeat CT scan of your chest in 4 weeks.    2.  Pneumonia is often slow to clear and get over.   3.  Continue to drink plenty of water and eat well.  4.  Add Glucerna shakes for the next two weeks as needed to supplement your nutrition 5.  We will check a urinalysis and also look for a particular type of bacteria that causes pneumonia in your urine and call you with the results 6.  Report to the ER immediately if new or worsening symptoms. 7.  Call our office if you have new needs or concerns.       Noe Gens, NP-C [place]  [phone] 06/13/2017, 1:29 PM

## 2017-06-13 NOTE — Patient Instructions (Addendum)
1.  I will have Dr. Elsworth Soho review your films and we will be in touch regarding possible bronchoscopy (if needed) vs a repeat CT scan of your chest in 4 weeks.    2.  Pneumonia is often slow to clear and get over.   3.  Continue to drink plenty of water and eat well.  4.  Add Glucerna shakes for the next two weeks as needed to supplement your nutrition 5.  We will check a urinalysis and also look for a particular type of bacteria that causes pneumonia in your urine and call you with the results 6.  Report to the ER immediately if new or worsening symptoms. 7.  Call our office if you have new needs or concerns.

## 2017-06-14 NOTE — Progress Notes (Signed)
Chart and office note reviewed in detail along with available xrays/ labs > agree with a/p as outlined  

## 2017-06-18 ENCOUNTER — Telehealth: Payer: Self-pay | Admitting: Adult Health

## 2017-06-18 NOTE — Telephone Encounter (Signed)
Spoke with patient. He saw SG and BO last week. He was called about his UA results and the status of the convo between RA and BO. Advised him that I had no heard anything. He said he was concerned because of the diagnosis of pneumonia and he is not feeling any better.   Pt is aware that he has an appt with MW on Wednesday and will need a CXR then.   RA, have you and Brandi discussed this patient at all? Please advise.

## 2017-06-19 NOTE — Telephone Encounter (Signed)
Pt has been scheduled with RA for 06/20/17 with a CXR before visit. He is aware. Nothing else needed at time of call.

## 2017-06-19 NOTE — Telephone Encounter (Signed)
Pl change his appt & make it with me

## 2017-06-19 NOTE — Telephone Encounter (Signed)
lmtcb x1 for pt. 

## 2017-06-20 ENCOUNTER — Ambulatory Visit: Payer: 59 | Admitting: Internal Medicine

## 2017-06-20 ENCOUNTER — Other Ambulatory Visit (INDEPENDENT_AMBULATORY_CARE_PROVIDER_SITE_OTHER): Payer: 59

## 2017-06-20 ENCOUNTER — Encounter: Payer: Self-pay | Admitting: Pulmonary Disease

## 2017-06-20 ENCOUNTER — Ambulatory Visit (INDEPENDENT_AMBULATORY_CARE_PROVIDER_SITE_OTHER): Payer: 59 | Admitting: Pulmonary Disease

## 2017-06-20 VITALS — BP 114/70 | HR 90 | Ht 74.0 in | Wt 311.0 lb

## 2017-06-20 DIAGNOSIS — J984 Other disorders of lung: Secondary | ICD-10-CM

## 2017-06-20 LAB — COMPREHENSIVE METABOLIC PANEL
ALT: 17 U/L (ref 0–53)
AST: 14 U/L (ref 0–37)
Albumin: 3.5 g/dL (ref 3.5–5.2)
Alkaline Phosphatase: 84 U/L (ref 39–117)
BUN: 11 mg/dL (ref 6–23)
CHLORIDE: 96 meq/L (ref 96–112)
CO2: 28 meq/L (ref 19–32)
Calcium: 9.4 mg/dL (ref 8.4–10.5)
Creatinine, Ser: 0.89 mg/dL (ref 0.40–1.50)
GFR: 114.7 mL/min (ref >60.00)
GLUCOSE: 113 mg/dL — AB (ref 70–99)
POTASSIUM: 3.9 meq/L (ref 3.5–5.1)
SODIUM: 131 meq/L — AB (ref 135–145)
Total Bilirubin: 0.7 mg/dL (ref 0.2–1.2)
Total Protein: 8.4 g/dL — ABNORMAL HIGH (ref 6.0–8.3)

## 2017-06-20 LAB — CBC WITH DIFFERENTIAL/PLATELET
BASOS PCT: 0.5 % (ref 0.0–3.0)
Basophils Absolute: 0.1 10*3/uL (ref 0.0–0.1)
EOS ABS: 0.8 10*3/uL — AB (ref 0.0–0.7)
EOS PCT: 8 % — AB (ref 0.0–5.0)
HCT: 40 % (ref 39.0–52.0)
Hemoglobin: 12.7 g/dL — ABNORMAL LOW (ref 13.0–17.0)
LYMPHS ABS: 1.1 10*3/uL (ref 0.7–4.0)
Lymphocytes Relative: 10.4 % — ABNORMAL LOW (ref 12.0–46.0)
MCHC: 31.8 g/dL (ref 30.0–36.0)
MCV: 75.2 fl — ABNORMAL LOW (ref 78.0–100.0)
MONO ABS: 1 10*3/uL (ref 0.1–1.0)
Monocytes Relative: 9.2 % (ref 3.0–12.0)
NEUTROS PCT: 71.9 % (ref 43.0–77.0)
Neutro Abs: 7.4 10*3/uL (ref 1.4–7.7)
Platelets: 443 10*3/uL — ABNORMAL HIGH (ref 150.0–400.0)
RBC: 5.31 Mil/uL (ref 4.22–5.81)
RDW: 15.7 % — AB (ref 11.5–15.5)
WBC: 10.4 10*3/uL (ref 4.0–10.5)

## 2017-06-20 NOTE — Progress Notes (Signed)
   Subjective:    Patient ID: Christian Hubbard, male    DOB: 12/04/1960, 56 y.o.   MRN: [id number]  HPI  56 y/o former smoker with OSA & benign right upper lobe nodule  He presented in 07/2013 with a right lower lobe cavitary pneumonia . 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 Chest x-ray suggested a right-sided infiltrate. CT chest showed multifocal bilateral consolidation, but was read as cavitation on my review is bronchiectasis and pneumatocele noted in the past Chest x-ray in 7/13 showed increased opacity in the right mid zone and hence referred to pulmonary, WBC count noted to be 9.8, ESR 97. He was again seen on 7/18 due to persistent symptoms.  Today he continues to complain of right-sided chest discomfort that comes and goes to the point where it interferes with his work. He reports increased sweating but denies fevers or chills. There is no burning micturition, cough or sputum production or dyspnea. He continues to complain of weight loss but objectively he weighed 312 pounds in 01/2017 and weighs 311 pounds today   SIGNIFICANT EVENTS / STUDIES:  CT 2014 - RLL pna + AF level , large 10cm .  CT chest done 12/09/15 with stable 3 mm RUL nodule and RLL pneumatocele.  6/26  CTA Chest >> neg for PE, widespread multifocal airspace consolidation compatible with severe multilobar PNA with evidence of early cavitation in some regions, trace right pleural effusion, severe hepatic steatosis, 2 vessel CAD  Review of Systems   neg for any significant sore throat, dysphagia, itching, sneezing, nasal congestion or excess/ purulent secretions, fever, chills, sweats, unintended wt loss, pleuritic or exertional cp, hempoptysis, orthopnea pnd or change in chronic leg swelling. Also denies presyncope, palpitations, heartburn,  abdominal pain, nausea, vomiting, diarrhea or change in bowel or urinary habits, dysuria,hematuria, rash, arthralgias, visual complaints, headache, numbness weakness or ataxia.     Objective:   Physical Exam   Gen. Pleasant, obese, in no distress ENT - no lesions, no post nasal drip Neck: No JVD, no thyromegaly, no carotid bruits Lungs: no use of accessory muscles, no dullness to percussion, decreased without rales or rhonchi  Cardiovascular: Rhythm regular, heart sounds  normal, no murmurs or gallops, no peripheral edema Musculoskeletal: No deformities, no cyanosis or clubbing , no tremors        Assessment & Plan:

## 2017-06-20 NOTE — Assessment & Plan Note (Signed)
He does have underlying bronchiectasis in the right lower lobe pneumatocele. Cause of multifocal consolidation is not clear-he does not have any symptoms of ongoing infection -fevers, chills, leukocytosis or tachycardia. He has already been treated with 3-4 rounds of antibiotics but continues to have ongoing chest discomfort is the main symptom.  We will repeat blood work including CBC today and chest x-ray-if he has leukocytosis or non-resolving infiltrates, then we will proceed with bronchoscopy otherwise just observe. He will take extra strength Tylenol for his chest pain. He will also obtain blood cultures, but I do not favor septic emboli here

## 2017-06-20 NOTE — Patient Instructions (Signed)
Blood work and chest x-ray today  If anything looks worse, we will proceed with bronchoscopy

## 2017-06-21 LAB — ANA: ANA: NEGATIVE

## 2017-06-22 ENCOUNTER — Telehealth: Payer: Self-pay | Admitting: Pulmonary Disease

## 2017-06-22 NOTE — Telephone Encounter (Signed)
Notes recorded by Rigoberto Noel, MD on 06/20/2017 at 1:47 PM EDT WBC count is stable, does not need more antibiotics Sodium level has dropped  Did he have chest x-ray done today?  Spoke with patient-he is aware of results and will come by the office for CXR next week. Will forward closed message to RA as FYI.

## 2017-06-27 ENCOUNTER — Ambulatory Visit (INDEPENDENT_AMBULATORY_CARE_PROVIDER_SITE_OTHER)
Admission: RE | Admit: 2017-06-27 | Discharge: 2017-06-27 | Disposition: A | Discharge status: Home / Self Care | Payer: 59 | Source: Ambulatory Visit | Attending: Pulmonary Disease | Admitting: Pulmonary Disease

## 2017-06-27 DIAGNOSIS — J189 Pneumonia, unspecified organism: Secondary | ICD-10-CM | POA: Diagnosis not present

## 2017-06-27 DIAGNOSIS — J984 Other disorders of lung: Secondary | ICD-10-CM

## 2017-06-27 LAB — CULTURE, BLOOD (SINGLE)
ORGANISM ID, BACTERIA: NO GROWTH
ORGANISM ID, BACTERIA: NO GROWTH

## 2017-07-02 ENCOUNTER — Ambulatory Visit (HOSPITAL_COMMUNITY)
Admission: RE | Admit: 2017-07-02 | Discharge: 2017-07-02 | Disposition: A | Discharge status: Home / Self Care | Payer: 59 | Source: Ambulatory Visit | Attending: Pulmonary Disease | Admitting: Pulmonary Disease

## 2017-07-02 ENCOUNTER — Encounter (HOSPITAL_COMMUNITY)
Admission: RE | Disposition: A | Discharge status: Home / Self Care | Payer: Self-pay | Source: Ambulatory Visit | Attending: Pulmonary Disease

## 2017-07-02 ENCOUNTER — Ambulatory Visit (HOSPITAL_COMMUNITY): Payer: 59

## 2017-07-02 DIAGNOSIS — Z9889 Other specified postprocedural states: Secondary | ICD-10-CM

## 2017-07-02 DIAGNOSIS — R848 Other abnormal findings in specimens from respiratory organs and thorax: Secondary | ICD-10-CM | POA: Diagnosis not present

## 2017-07-02 DIAGNOSIS — Z6841 Body Mass Index (BMI) 40.0 and over, adult: Secondary | ICD-10-CM | POA: Insufficient documentation

## 2017-07-02 DIAGNOSIS — E669 Obesity, unspecified: Secondary | ICD-10-CM | POA: Insufficient documentation

## 2017-07-02 DIAGNOSIS — J9809 Other diseases of bronchus, not elsewhere classified: Secondary | ICD-10-CM | POA: Diagnosis not present

## 2017-07-02 DIAGNOSIS — G4733 Obstructive sleep apnea (adult) (pediatric): Secondary | ICD-10-CM | POA: Insufficient documentation

## 2017-07-02 DIAGNOSIS — Z87891 Personal history of nicotine dependence: Secondary | ICD-10-CM | POA: Insufficient documentation

## 2017-07-02 DIAGNOSIS — J189 Pneumonia, unspecified organism: Secondary | ICD-10-CM | POA: Insufficient documentation

## 2017-07-02 DIAGNOSIS — J181 Lobar pneumonia, unspecified organism: Secondary | ICD-10-CM | POA: Diagnosis not present

## 2017-07-02 HISTORY — PX: VIDEO BRONCHOSCOPY: SHX5072

## 2017-07-02 SURGERY — BRONCHOSCOPY, WITH FLUOROSCOPY
Anesthesia: Moderate Sedation | Laterality: Bilateral

## 2017-07-02 MED ORDER — MIDAZOLAM HCL 5 MG/ML IJ SOLN
INTRAMUSCULAR | Status: AC
Start: 2017-07-02 — End: ?
  Filled 2017-07-02: qty 2

## 2017-07-02 MED ORDER — FENTANYL CITRATE (PF) 100 MCG/2ML IJ SOLN
INTRAMUSCULAR | Status: AC
  Filled 2017-07-02: qty 4

## 2017-07-02 MED ORDER — LIDOCAINE HCL 2 % EX GEL
CUTANEOUS | Status: DC | PRN
Start: 2017-07-02 — End: 2017-07-02
  Administered 2017-07-02: 1

## 2017-07-02 MED ORDER — SODIUM CHLORIDE 0.9 % IV SOLN
INTRAVENOUS | Status: DC
  Administered 2017-07-02: 08:00:00 via INTRAVENOUS

## 2017-07-02 MED ORDER — LIDOCAINE HCL 2 % EX GEL: 1.0000 "application " | Freq: Once | CUTANEOUS | Status: DC

## 2017-07-02 MED ORDER — PHENYLEPHRINE HCL 0.25 % NA SOLN
NASAL | Status: DC | PRN
  Administered 2017-07-02: 2 via NASAL

## 2017-07-02 MED ORDER — BUTAMBEN-TETRACAINE-BENZOCAINE 2-2-14 % EX AERO: 1.0000 | INHALATION_SPRAY | Freq: Once | CUTANEOUS | Status: DC

## 2017-07-02 MED ORDER — LIDOCAINE HCL 1 % IJ SOLN
INTRAMUSCULAR | Status: DC | PRN
Start: 2017-07-02 — End: 2017-07-02
  Administered 2017-07-02: 6 mL

## 2017-07-02 MED ORDER — FENTANYL CITRATE (PF) 100 MCG/2ML IJ SOLN
INTRAMUSCULAR | Status: DC | PRN
  Administered 2017-07-02 (×6): 25 ug via INTRAVENOUS

## 2017-07-02 MED ORDER — PHENYLEPHRINE HCL 0.25 % NA SOLN: 1.0000 | Freq: Four times a day (QID) | NASAL | Status: DC | PRN

## 2017-07-02 MED ORDER — MIDAZOLAM HCL 10 MG/2ML IJ SOLN
INTRAMUSCULAR | Status: DC | PRN
  Administered 2017-07-02 (×4): 1 mg via INTRAVENOUS

## 2017-07-02 NOTE — Op Note (Signed)
Indication : Non resolving RLL pneumonia  in this obese  ex smoker. Written informed consent was obtained prior to the procedure. The risks of the procedure including coughing, bleeding and the small chance of lung puncture requiring chest tube were discussed in great detail. The benefits & alternatives including serial follow up were also discussed.  4 mg versed & 150  mcg fentnayl used in divided doses during the procedure Bronchoscope entered from the right nare. Upper airway nml Vocal cords showed nml appearance & motion. Trachea & bronchial tree examined to the subsegmental level. No significantsecretions were noted. No endobronchial lesions seen. Trans bronchial biopsies x 2 were obtained from the RUL under fluoroscopy. Brushings x 1 & BAL was also obtained from the RLL.  There was moderate coughing  during the procedure.   A CXR will be performed to r/o presence of pneumothorax.    ALVA,RAKESH V.  [phone]

## 2017-07-02 NOTE — Interval H&P Note (Signed)
History and Physical Interval Note:  07/02/2017 9:38 AM  Christian Hubbard  has presented today for surgery, with the diagnosis of Pneumonia   The various methods of treatment have been discussed with the patient and family. After consideration of risks, benefits and other options for treatment, the patient has consented to  Procedure(s): VIDEO BRONCHOSCOPY WITH FLUORO (Bilateral) as a surgical intervention .  The patient's history has been reviewed, patient examined, no change in status, stable for surgery.  I have reviewed the patient's chart and labs.  Questions were answered to the patient's satisfaction.     [name] V.

## 2017-07-02 NOTE — Progress Notes (Signed)
Video bronchoscopy performed Intervention bronchial washings Intervention bronchial brushings Intervention bronchial biopsies Pt tolerated well  Ankush Gintz David RRT  

## 2017-07-02 NOTE — H&P (View-Only) (Signed)
   Subjective:    Patient ID: Christian Hubbard, male    DOB: 05/05/1961, 55 y.o.   MRN: [id number]  HPI  55 y/o former smoker with OSA & benign right upper lobe nodule  He presented in 07/2013 with a right lower lobe cavitary pneumonia . 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 Chest x-ray suggested a right-sided infiltrate. CT chest showed multifocal bilateral consolidation, but was read as cavitation on my review is bronchiectasis and pneumatocele noted in the past Chest x-ray in 7/13 showed increased opacity in the right mid zone and hence referred to pulmonary, WBC count noted to be 9.8, ESR 97. He was again seen on 7/18 due to persistent symptoms.  Today he continues to complain of right-sided chest discomfort that comes and goes to the point where it interferes with his work. He reports increased sweating but denies fevers or chills. There is no burning micturition, cough or sputum production or dyspnea. He continues to complain of weight loss but objectively he weighed 312 pounds in 01/2017 and weighs 311 pounds today   SIGNIFICANT EVENTS / STUDIES:  CT 2014 - RLL pna + AF level , large 10cm .  CT chest done 12/09/15 with stable 3 mm RUL nodule and RLL pneumatocele.  6/26  CTA Chest >> neg for PE, widespread multifocal airspace consolidation compatible with severe multilobar PNA with evidence of early cavitation in some regions, trace right pleural effusion, severe hepatic steatosis, 2 vessel CAD  Review of Systems   neg for any significant sore throat, dysphagia, itching, sneezing, nasal congestion or excess/ purulent secretions, fever, chills, sweats, unintended wt loss, pleuritic or exertional cp, hempoptysis, orthopnea pnd or change in chronic leg swelling. Also denies presyncope, palpitations, heartburn,  abdominal pain, nausea, vomiting, diarrhea or change in bowel or urinary habits, dysuria,hematuria, rash, arthralgias, visual complaints, headache, numbness weakness or ataxia.     Objective:   Physical Exam   Gen. Pleasant, obese, in no distress ENT - no lesions, no post nasal drip Neck: No JVD, no thyromegaly, no carotid bruits Lungs: no use of accessory muscles, no dullness to percussion, decreased without rales or rhonchi  Cardiovascular: Rhythm regular, heart sounds  normal, no murmurs or gallops, no peripheral edema Musculoskeletal: No deformities, no cyanosis or clubbing , no tremors        Assessment & Plan:   

## 2017-07-02 NOTE — Discharge Instructions (Signed)
Flexible Bronchoscopy, Care After These instructions give you information on caring for yourself after your procedure. Your doctor may also give you more specific instructions. Call your doctor if you have any problems or questions after your procedure. Follow these instructions at home:  Do not eat or drink anything for 2 hours after your procedure. If you try to eat or drink before the medicine wears off, food or drink could go into your lungs. You could also burn yourself.  After 2 hours have passed and when you can cough and gag normally, you may eat soft food and drink liquids slowly.  The day after the test, you may eat your normal diet.  You may do your normal activities.  Keep all doctor visits. Get help right away if:  You get more and more short of breath.  You get light-headed.  You feel like you are going to pass out (faint).  You have chest pain.  You have new problems that worry you.  You cough up more than a little blood.  You cough up more blood than before. This information is not intended to replace advice given to you by your health care provider. Make sure you discuss any questions you have with your health care provider. Document Released: 09/10/2009 Document Revised: 04/20/2016 Document Reviewed: 07/18/2013 Elsevier Interactive Patient Education  2017 Center not eat or drink until after 1130 today 07/02/17

## 2017-07-03 ENCOUNTER — Encounter (HOSPITAL_COMMUNITY): Payer: Self-pay | Admitting: Pulmonary Disease

## 2017-07-03 LAB — ACID FAST SMEAR (AFB): ACID FAST SMEAR - AFSCU2: NEGATIVE

## 2017-07-03 LAB — ACID FAST SMEAR (AFB, MYCOBACTERIA)

## 2017-07-04 ENCOUNTER — Telehealth: Payer: Self-pay | Admitting: Pulmonary Disease

## 2017-07-04 LAB — CULTURE, BAL-QUANTITATIVE W GRAM STAIN: Culture: 30000 — AB

## 2017-07-04 LAB — CULTURE, BAL-QUANTITATIVE

## 2017-07-04 NOTE — Telephone Encounter (Signed)
Patient dropped off FMLA forms to be completed- fwd to [employer] via interoffice mail -pr

## 2017-07-05 ENCOUNTER — Telehealth: Payer: Self-pay | Admitting: Pulmonary Disease

## 2017-07-05 DIAGNOSIS — J189 Pneumonia, unspecified organism: Secondary | ICD-10-CM

## 2017-07-05 NOTE — Telephone Encounter (Signed)
Pt is aware of result and voiced his understanding. Will order cxr after received response from RA.  RA what dx would you like CXR associated with?  Notes recorded by Rigoberto Noel, MD on 07/04/2017 at 11:27 AM EDT I tried to call him to give results Cultures negative -no more antibiotics required, repeat chest x-ray around 8/15 Biopsy negative for cancer or inflammatory condition Observe for now

## 2017-07-05 NOTE — Telephone Encounter (Signed)
CXR has been ordered.  Nothing further needed.

## 2017-07-05 NOTE — Telephone Encounter (Signed)
Community acquired pneumonia.

## 2017-07-09 ENCOUNTER — Ambulatory Visit (INDEPENDENT_AMBULATORY_CARE_PROVIDER_SITE_OTHER)
Admission: RE | Admit: 2017-07-09 | Discharge: 2017-07-09 | Disposition: A | Discharge status: Home / Self Care | Payer: 59 | Source: Ambulatory Visit | Attending: Pulmonary Disease | Admitting: Pulmonary Disease

## 2017-07-09 ENCOUNTER — Other Ambulatory Visit: Payer: 59

## 2017-07-09 DIAGNOSIS — R05 Cough: Secondary | ICD-10-CM | POA: Diagnosis not present

## 2017-07-09 DIAGNOSIS — J189 Pneumonia, unspecified organism: Secondary | ICD-10-CM | POA: Diagnosis not present

## 2017-07-09 DIAGNOSIS — J984 Other disorders of lung: Secondary | ICD-10-CM

## 2017-07-09 DIAGNOSIS — R0602 Shortness of breath: Secondary | ICD-10-CM | POA: Diagnosis not present

## 2017-07-10 ENCOUNTER — Inpatient Hospital Stay (HOSPITAL_COMMUNITY): Payer: 59

## 2017-07-10 ENCOUNTER — Inpatient Hospital Stay (HOSPITAL_COMMUNITY)
Admission: AD | Admit: 2017-07-10 | Discharge: 2017-07-17 | DRG: 196 | Disposition: A | Discharge status: Home / Self Care | Payer: 59 | Source: Ambulatory Visit | Attending: Pulmonary Disease | Admitting: Pulmonary Disease

## 2017-07-10 ENCOUNTER — Telehealth: Payer: Self-pay | Admitting: Pulmonary Disease

## 2017-07-10 ENCOUNTER — Encounter (HOSPITAL_COMMUNITY): Payer: Self-pay | Admitting: General Practice

## 2017-07-10 DIAGNOSIS — R911 Solitary pulmonary nodule: Secondary | ICD-10-CM | POA: Diagnosis present

## 2017-07-10 DIAGNOSIS — I10 Essential (primary) hypertension: Secondary | ICD-10-CM | POA: Diagnosis present

## 2017-07-10 DIAGNOSIS — Z87891 Personal history of nicotine dependence: Secondary | ICD-10-CM | POA: Diagnosis not present

## 2017-07-10 DIAGNOSIS — I700 Atherosclerosis of aorta: Secondary | ICD-10-CM | POA: Diagnosis present

## 2017-07-10 DIAGNOSIS — R918 Other nonspecific abnormal finding of lung field: Secondary | ICD-10-CM | POA: Diagnosis not present

## 2017-07-10 DIAGNOSIS — Z9989 Dependence on other enabling machines and devices: Secondary | ICD-10-CM

## 2017-07-10 DIAGNOSIS — E876 Hypokalemia: Secondary | ICD-10-CM | POA: Diagnosis not present

## 2017-07-10 DIAGNOSIS — K760 Fatty (change of) liver, not elsewhere classified: Secondary | ICD-10-CM | POA: Diagnosis present

## 2017-07-10 DIAGNOSIS — Z8701 Personal history of pneumonia (recurrent): Secondary | ICD-10-CM

## 2017-07-10 DIAGNOSIS — E785 Hyperlipidemia, unspecified: Secondary | ICD-10-CM | POA: Diagnosis present

## 2017-07-10 DIAGNOSIS — J90 Pleural effusion, not elsewhere classified: Secondary | ICD-10-CM | POA: Diagnosis not present

## 2017-07-10 DIAGNOSIS — Z6839 Body mass index (BMI) 39.0-39.9, adult: Secondary | ICD-10-CM | POA: Diagnosis not present

## 2017-07-10 DIAGNOSIS — I2510 Atherosclerotic heart disease of native coronary artery without angina pectoris: Secondary | ICD-10-CM | POA: Diagnosis present

## 2017-07-10 DIAGNOSIS — G4733 Obstructive sleep apnea (adult) (pediatric): Secondary | ICD-10-CM | POA: Diagnosis present

## 2017-07-10 DIAGNOSIS — J9690 Respiratory failure, unspecified, unspecified whether with hypoxia or hypercapnia: Secondary | ICD-10-CM | POA: Diagnosis not present

## 2017-07-10 DIAGNOSIS — J189 Pneumonia, unspecified organism: Secondary | ICD-10-CM | POA: Diagnosis not present

## 2017-07-10 DIAGNOSIS — E669 Obesity, unspecified: Secondary | ICD-10-CM | POA: Diagnosis present

## 2017-07-10 DIAGNOSIS — K219 Gastro-esophageal reflux disease without esophagitis: Secondary | ICD-10-CM | POA: Diagnosis present

## 2017-07-10 DIAGNOSIS — J82 Pulmonary eosinophilia, not elsewhere classified: Secondary | ICD-10-CM | POA: Diagnosis present

## 2017-07-10 DIAGNOSIS — J8489 Other specified interstitial pulmonary diseases: Secondary | ICD-10-CM | POA: Diagnosis not present

## 2017-07-10 DIAGNOSIS — J9600 Acute respiratory failure, unspecified whether with hypoxia or hypercapnia: Secondary | ICD-10-CM | POA: Diagnosis not present

## 2017-07-10 DIAGNOSIS — J80 Acute respiratory distress syndrome: Secondary | ICD-10-CM | POA: Diagnosis not present

## 2017-07-10 DIAGNOSIS — J9601 Acute respiratory failure with hypoxia: Secondary | ICD-10-CM | POA: Diagnosis not present

## 2017-07-10 DIAGNOSIS — R0602 Shortness of breath: Secondary | ICD-10-CM | POA: Diagnosis not present

## 2017-07-10 HISTORY — DX: Essential (primary) hypertension: I10

## 2017-07-10 LAB — CBC WITH DIFFERENTIAL/PLATELET
BASOS PCT: 0 %
Basophils Absolute: 0 10*3/uL (ref 0.0–0.1)
EOS ABS: 1 10*3/uL — AB (ref 0.0–0.7)
Eosinophils Relative: 11 %
HCT: 37.1 % — ABNORMAL LOW (ref 39.0–52.0)
Hemoglobin: 11.8 g/dL — ABNORMAL LOW (ref 13.0–17.0)
Lymphocytes Relative: 16 %
Lymphs Abs: 1.5 10*3/uL (ref 0.7–4.0)
MCH: 23.6 pg — AB (ref 26.0–34.0)
MCHC: 31.8 g/dL (ref 30.0–36.0)
MCV: 74.1 fL — AB (ref 78.0–100.0)
Monocytes Absolute: 0.7 10*3/uL (ref 0.1–1.0)
Monocytes Relative: 8 %
NEUTROS PCT: 65 %
Neutro Abs: 6.2 10*3/uL (ref 1.7–7.7)
PLATELETS: 407 10*3/uL — AB (ref 150–400)
RBC: 5.01 MIL/uL (ref 4.22–5.81)
RDW: 16 % — ABNORMAL HIGH (ref 11.5–15.5)
WBC: 9.5 10*3/uL (ref 4.0–10.5)

## 2017-07-10 LAB — COMPREHENSIVE METABOLIC PANEL
ALK PHOS: 82 U/L (ref 38–126)
ALT: 15 U/L — AB (ref 17–63)
AST: 17 U/L (ref 15–41)
Albumin: 2.9 g/dL — ABNORMAL LOW (ref 3.5–5.0)
Anion gap: 7 (ref 5–15)
BUN: 12 mg/dL (ref 6–20)
CALCIUM: 8.8 mg/dL — AB (ref 8.9–10.3)
CO2: 28 mmol/L (ref 22–32)
CREATININE: 1.09 mg/dL (ref 0.61–1.24)
Chloride: 97 mmol/L — ABNORMAL LOW (ref 101–111)
Glucose, Bld: 108 mg/dL — ABNORMAL HIGH (ref 65–99)
Potassium: 3.6 mmol/L (ref 3.5–5.1)
Sodium: 132 mmol/L — ABNORMAL LOW (ref 135–145)
TOTAL PROTEIN: 8 g/dL (ref 6.5–8.1)
Total Bilirubin: 0.6 mg/dL (ref 0.3–1.2)

## 2017-07-10 LAB — PHOSPHORUS: Phosphorus: 4.7 mg/dL — ABNORMAL HIGH (ref 2.5–4.6)

## 2017-07-10 LAB — MAGNESIUM: MAGNESIUM: 2 mg/dL (ref 1.7–2.4)

## 2017-07-10 LAB — BRAIN NATRIURETIC PEPTIDE: B NATRIURETIC PEPTIDE 5: 13.2 pg/mL (ref 0.0–100.0)

## 2017-07-10 LAB — PROCALCITONIN: Procalcitonin: <0.1 ng/mL

## 2017-07-10 MED ORDER — PANTOPRAZOLE SODIUM 40 MG PO TBEC
40.0000 mg | DELAYED_RELEASE_TABLET | Freq: Every day | ORAL | Status: DC
Start: 2017-07-11 — End: 2017-07-17
  Administered 2017-07-11 – 2017-07-17 (×7): 40 mg via ORAL
  Filled 2017-07-10 (×7): qty 1

## 2017-07-10 MED ORDER — VANCOMYCIN HCL 10 G IV SOLR
2000.0000 mg | Freq: Once | INTRAVENOUS | Status: AC
  Administered 2017-07-10: 2000 mg via INTRAVENOUS
  Filled 2017-07-10 (×2): qty 2000

## 2017-07-10 MED ORDER — PIPERACILLIN-TAZOBACTAM 3.375 G IVPB
3.3750 g | Freq: Three times a day (TID) | INTRAVENOUS | Status: DC
  Administered 2017-07-11 – 2017-07-16 (×17): 3.375 g via INTRAVENOUS
  Filled 2017-07-10 (×18): qty 50

## 2017-07-10 MED ORDER — HYDROCHLOROTHIAZIDE 12.5 MG PO CAPS
12.5000 mg | ORAL_CAPSULE | Freq: Every day | ORAL | Status: DC
  Administered 2017-07-11 – 2017-07-17 (×7): 12.5 mg via ORAL
  Filled 2017-07-10 (×7): qty 1

## 2017-07-10 MED ORDER — PRAVASTATIN SODIUM 40 MG PO TABS
40.0000 mg | ORAL_TABLET | Freq: Every day | ORAL | Status: DC
  Administered 2017-07-11 – 2017-07-16 (×6): 40 mg via ORAL
  Filled 2017-07-10 (×6): qty 1

## 2017-07-10 MED ORDER — PIPERACILLIN-TAZOBACTAM 3.375 G IVPB 30 MIN
3.3750 g | Freq: Once | INTRAVENOUS | Status: AC
  Administered 2017-07-10: 3.375 g via INTRAVENOUS
  Filled 2017-07-10 (×2): qty 50

## 2017-07-10 MED ORDER — HEPARIN SODIUM (PORCINE) 5000 UNIT/ML IJ SOLN
5000.0000 [IU] | Freq: Three times a day (TID) | INTRAMUSCULAR | Status: DC
  Administered 2017-07-11 – 2017-07-17 (×18): 5000 [IU] via SUBCUTANEOUS
  Filled 2017-07-10 (×17): qty 1

## 2017-07-10 MED ORDER — LOSARTAN POTASSIUM-HCTZ 100-12.5 MG PO TABS: 1.0000 | ORAL_TABLET | Freq: Every day | ORAL | Status: DC

## 2017-07-10 MED ORDER — ACETAMINOPHEN 325 MG PO TABS: 650.0000 mg | ORAL_TABLET | ORAL | Status: DC | PRN

## 2017-07-10 MED ORDER — ONDANSETRON HCL 4 MG/2ML IJ SOLN: 4.0000 mg | Freq: Four times a day (QID) | INTRAMUSCULAR | Status: DC | PRN

## 2017-07-10 MED ORDER — SODIUM CHLORIDE 0.9 % IV SOLN: 250.0000 mL | INTRAVENOUS | Status: DC | PRN

## 2017-07-10 MED ORDER — VANCOMYCIN HCL 10 G IV SOLR
1500.0000 mg | Freq: Two times a day (BID) | INTRAVENOUS | Status: DC
  Administered 2017-07-11 – 2017-07-13 (×5): 1500 mg via INTRAVENOUS
  Filled 2017-07-10 (×5): qty 1500

## 2017-07-10 MED ORDER — AMLODIPINE BESYLATE 5 MG PO TABS
5.0000 mg | ORAL_TABLET | Freq: Every day | ORAL | Status: DC
  Administered 2017-07-11 – 2017-07-17 (×7): 5 mg via ORAL
  Filled 2017-07-10 (×7): qty 1

## 2017-07-10 MED ORDER — DEXTROSE 5 % IV SOLN
500.0000 mg | INTRAVENOUS | Status: DC
  Administered 2017-07-10 – 2017-07-15 (×6): 500 mg via INTRAVENOUS
  Filled 2017-07-10 (×8): qty 500

## 2017-07-10 MED ORDER — SODIUM CHLORIDE 0.9 % IV SOLN
INTRAVENOUS | Status: DC
  Administered 2017-07-10 – 2017-07-14 (×2): via INTRAVENOUS

## 2017-07-10 MED ORDER — LOSARTAN POTASSIUM 50 MG PO TABS
100.0000 mg | ORAL_TABLET | Freq: Every day | ORAL | Status: DC
  Administered 2017-07-11 – 2017-07-17 (×7): 100 mg via ORAL
  Filled 2017-07-10 (×7): qty 2

## 2017-07-10 NOTE — Progress Notes (Signed)
Pt has home CPAP at bedside and states he can set-up and place on himself per home use. I told him to call if he needs any further assistance.

## 2017-07-10 NOTE — H&P (Signed)
Name: Christian Hubbard MRN: [id number] DOB: 30-Dec-1960    ADMISSION DATE:  07/10/2017 CONSULTATION DATE:  07/10/17  REFERRING MD :  Dr. Elsworth Soho   CHIEF COMPLAINT:  Bilateral Infiltrates   HISTORY OF PRESENT ILLNESS:  56 y/o M, former smoker, with a PMH of solitary pulmonary nodule, OSA on CPAP, prior PNA and pneumatocele.   In late June (6/26) he saw his PCP with concerns for PNA with increased fatigue, shortness of breath and right sided chest pain.  He also had lost ~ 30 lbs.  He was treated with Levaquin.  Given symptoms a CTA of the chest was assessed which was negative for PE but showed widespread multifocal airspace consolidation compatible with severe multilobar PNA with evidence of early cavitation in some regions, trace right pleural effusion, severe hepatic steatosis, 2 vessel CAD. He followed up with his PCP 7/13 and CXR showed a worsening of opacity in the right mid lung.  He thinks he completed approximately 4 weeks of Levaquin.  He followed up in the Pulmonary office on 7/16 with ongoing SOB, fatigue.  Quantiferon gold assessed and negative, ANCA negative, WBC 9.8, Hgb 13.3, Sed rate 97.  He returned 7/18 for follow up and reported no resolution of symptoms.  Urine strep antigen was ordered but not completed. Given slow resolution a bronchoscopy was completed 8/6 which showed a normal upper airway, normal vocal cords, no significant secretions, no endobronchial lesions.  Transbronchial biopsies x2, brushings and BAL were obtained > cytology, cultures, AFB/Fungal (prelim) negative (final fungal / AFB pending).  A repeat CXR was completed on 8/13 which showed an interval worsening of right sided opacities with additional opacities on the left.    The patient was instructed to come to the hospital for direct admission.    PAST MEDICAL HISTORY :   has a past medical history of GERD (gastroesophageal reflux disease); Hyperlipemia; Hypertension; OSA on CPAP; and Pneumonia (08/10/2014;  07/10/2017).   has a past surgical history that includes Video bronchoscopy (Bilateral, 07/02/2017).  Prior to Admission medications   Medication Sig Start Date End Date Taking? Authorizing Provider  amLODipine (NORVASC) 5 MG tablet Take 5 mg by mouth daily.  11/25/15   [provider]  losartan-hydrochlorothiazide (HYZAAR) 100-12.5 MG tablet Take 1 tablet by mouth daily. 11/23/15   [provider]  naproxen (NAPROSYN) 500 MG tablet Take 500 mg by mouth 2 (two) times daily as needed for pain. 05/25/17   [provider]  omeprazole (PRILOSEC) 40 MG capsule Take 40 mg by mouth daily.    [provider]  pravastatin (PRAVACHOL) 40 MG tablet Take 40 mg by mouth daily.    [provider]  sildenafil (VIAGRA) 100 MG tablet Take 100 mg by mouth daily as needed for erectile dysfunction.    [provider]    No Known Allergies  FAMILY HISTORY:  family history is not on file.  SOCIAL HISTORY:  reports that he quit smoking about 12 years ago. His smoking use included Cigarettes. He has a 24.00 pack-year smoking history. He has never used smokeless tobacco. He reports that he drinks about 3.6 oz of alcohol per week . He reports that he does not use drugs.  REVIEW OF SYSTEMS:  POSITIVES IN BOLD Constitutional: Negative for fever, chills, weight loss, malaise/fatigue and diaphoresis.  HENT: Negative for hearing loss, ear pain, nosebleeds, congestion, sore throat, neck pain, tinnitus and ear discharge.   Eyes: Negative for blurred vision, double vision, photophobia, pain, discharge and  redness.  Respiratory: Negative for cough, hemoptysis, sputum production, shortness of breath, wheezing and stridor.   Cardiovascular: Negative for chest pain, palpitations, orthopnea, claudication, leg swelling and PND.  Gastrointestinal: Negative for heartburn, nausea, vomiting, abdominal pain, diarrhea, constipation, blood in stool and melena.  Genitourinary: Negative  for dysuria, urgency, frequency, hematuria and flank pain.  Musculoskeletal: Negative for myalgias, back pain, joint pain and falls.  Skin: Negative for itching and rash.  Neurological: Negative for dizziness, tingling, tremors, sensory change, speech change, focal weakness, seizures, loss of consciousness, weakness and headaches.  Endo/Heme/Allergies: Negative for environmental allergies and polydipsia. Does not bruise/bleed easily.  SUBJECTIVE:  Ongoing dyspnea. Denies cough/sputum production.   VITAL SIGNS: Temp:  [98.9 F (37.2 C)] 98.9 F (37.2 C) (08/14 1824) Pulse Rate:  [89] 89 (08/14 1824) Resp:  [18] 18 (08/14 1824) BP: (120)/(84) 120/84 (08/14 1824) SpO2:  [99 %] 99 % (08/14 1824) Weight:  [141.1 kg (311 lb)] 141.1 kg (311 lb) (08/14 1600)  PHYSICAL EXAMINATION: General: Adult male, no distress   HEENT: MM pink/moist Neuro: Alert, oriented, grossly intact  CV: s1s2 rrr, no m/r/g PULM: even/non-labored, diminished breath sounds to bases  JS:HFWY, non-tender, bsx4 active  Extremities: warm/dry, - edema  Skin: no rashes or lesions   No results for input(s): NA, K, CL, CO2, BUN, CREATININE, GLUCOSE in the last 168 hours.  No results for input(s): HGB, HCT, WBC, PLT in the last 168 hours.  Dg Chest 2 View  Result Date: 07/09/2017 CLINICAL DATA:  Follow-up of pneumonia. Persistent productive cough and shortness of breath. Former smoker. EXAM: CHEST  2 VIEW COMPARISON:  Chest x-ray of July 02, 2017 and chest CT scan of May 22, 2017. FINDINGS: The lungs are adequately inflated. There is increase conspicuity of confluent alveolar opacities in the right mid lung. Peripherally located subpleural increased density in the left mid lung is fairly stable. Small amounts of pleural fluid or pleural thickening blunt the costophrenic angles. The heart is normal in size. The pulmonary vascularity is not engorged. The trachea is midline. The bony thorax exhibits no acute abnormality.  IMPRESSION: Interval worsening of confluent alveolar opacity on the right with possible development of similar opacities on the left in addition to the area of stable change in the subpleural region on the left. The findings are suspicious for progressive pneumonia. Chest CT scanning would be a useful next imaging step and allow direct comparison to the study of May 22, 2017. Electronically Signed   By: David  Martinique M.D.   On: 07/09/2017 11:16   CULTURES BCx2 8/14 >>  Sputum 8/14 >> U. Strep 8/14 >>  U. Legionella 8/14 >>   ANTIBIOTICS Vanco 8/14 >>  Zosyn 8/14 >>  Azithro 8/14 >>   SIGNIFICANT EVENTS  6/26  Seen by PCP, reportedly rx'd levaquin  7/16  Seen in Pulmonary  7/18  Seen in Pulmonary  8/06  FOB  8/14  Admit for evaluation of infiltrates  STUDIES:  CTA Chest 05/22/17 >> neg for PE (motion limited), widespread multifocal airspace consolidation throughout bilateral lungs compatible with severe multilobar bronchopneumonia, with evidence of early cavitation in some regions, trace right pleural effusion, probable reactive mediastinal & hilar lymphadenopathy, severe hepatic steatosis, aortic atherosclerosis, in addition to left main and 2 vessel CAD  CT Chest 8/14 >>  SLP 8/14 >>    ASSESSMENT / PLAN:  Discussion:  56 y/o M with prior hx of PNA / pneumatocele with repeat episode of of PNA that began in  June of 2018.  He was treated with levaquin as an outpatient with additional work up as above.  He underwent an FOB 8/6 with negative cultures and cytology.  Despite outpatient antibiotics (unable to see PCP records, ? If he actually had 4 weeks abx), he has had an interval worsening of airspace disease.     Right Community Acquired PNA  Bilateral Infiltrates, R>L Plan: Admit to med-surg floor  Begin vanco, zosyn and azithro for PNA  Assess CT chest to evaluate infiltrates Assess cultures as above  SLP consult to evaluate for aspiration  Assess ECHO  Trend PCT    OSA on  CPAP  Plan: Nocturnal auto-titration CPAP    GERD  Plan: Continue home PPI   HTN HLD Plan: Continue home norvasc, hyzaar, pravachol    DVT Prophylaxis - heparin, SCD's GI - protonix  Diet - regular diet as tolerated until seen by SLP Family - No family present  Hayden Pedro, AGACNP-BC [facility]  Pgr: [phone]  PCCM Pgr: [phone]   I have seen and examined the patient with Mrs. Dewaine Oats, I agree with the assessment and plan.  Unresolved pneumonia, negative cultures and TB results. HIV negative in 2016.  Hemodynamically stable and on RA   Agree with CT of the chest, broad abx. Will get ID involved and pulmonary to follow. Repeat HIV test.  Samul Dada, MD CCM attending

## 2017-07-10 NOTE — Progress Notes (Signed)
New Admission Note:   Arrival Method: wheelchair 0 Mental Orientation:  Telemetry: Assessment: Completed Skin: see flow sheet  IV: none at the moment  Pain: denies Tubes: none Safety Measures: Safety Fall Prevention Plan has been given, discussed and signed Admission: Completed [facility] Orientation: Patient has been orientated to the room, unit and staff.  Family: none   Orders have been reviewed and implemented. Will continue to monitor the patient. Call light has been placed within reach and bed alarm has been activated.   Emilio Math, RN [facility]  Phone number: [phone]

## 2017-07-10 NOTE — Telephone Encounter (Signed)
I will place forms in RA's look-at.

## 2017-07-10 NOTE — Telephone Encounter (Signed)
Rec'd FMLA forms via interoffice mail from [employer] - fwd to [place] to have RA complete - pr

## 2017-07-10 NOTE — Telephone Encounter (Signed)
Forms are in RA's look-at. I will try to have him sign them on Thursday while in [place].

## 2017-07-10 NOTE — Telephone Encounter (Signed)
Please let him know that chest x-ray continues to worsen I would like him to get admitted for IV antibiotics and chest CT scan ASAP. Please arrange for direct admission to [facility] -med surg bed- once bed available pl ask Brandi NP who has seen him before to write admission orders and inform [phone] pager

## 2017-07-10 NOTE — Telephone Encounter (Signed)
Spoke with bed placement, they are working on getting the patient a bed at [facility]. At the time of call, they did not have any med-surg beds but they are also in the process of discharging patient. They will call the patient once the bed is available. I have already spoken with the patient and advised him of RA's plan. He verbalized understanding.   I will give Velna Hatchet a call once the patient has a room.

## 2017-07-10 NOTE — Progress Notes (Signed)
Pharmacy Antibiotic Note  Christian Hubbard is a 56 y.o. male admitted on 07/10/2017 with pneumonia.  Pharmacy has been consulted for Vancomycin / Zosyn dosing.  Failed ongoing outpatient treatment   Plan: Zosyn 3.375 grams iv Q 8 hours - 4 hr infusion Vancomycin 2 gram iv X 1 dose now then 1500 mg iv Q 12 hours Follow up progress, Scr, cultures  Weight: (!) 311 lb (141.1 kg)    Estimated Creatinine Clearance: 143.6 mL/min (by C-G formula based on SCr of 0.89 mg/dL).    No Known Allergies   Thank you Anette Guarneri, PharmD [phone]  07/10/2017 5:34 PM

## 2017-07-11 ENCOUNTER — Inpatient Hospital Stay (HOSPITAL_COMMUNITY): Payer: 59

## 2017-07-11 DIAGNOSIS — J189 Pneumonia, unspecified organism: Secondary | ICD-10-CM

## 2017-07-11 DIAGNOSIS — J80 Acute respiratory distress syndrome: Secondary | ICD-10-CM

## 2017-07-11 LAB — CBC
HEMATOCRIT: 33.9 % — AB (ref 39.0–52.0)
HEMOGLOBIN: 10.6 g/dL — AB (ref 13.0–17.0)
MCH: 22.8 pg — ABNORMAL LOW (ref 26.0–34.0)
MCHC: 31.3 g/dL (ref 30.0–36.0)
MCV: 72.9 fL — AB (ref 78.0–100.0)
Platelets: 408 10*3/uL — ABNORMAL HIGH (ref 150–400)
RBC: 4.65 MIL/uL (ref 4.22–5.81)
RDW: 15.7 % — AB (ref 11.5–15.5)
WBC: 9.2 10*3/uL (ref 4.0–10.5)

## 2017-07-11 LAB — URINALYSIS, ROUTINE W REFLEX MICROSCOPIC
BACTERIA UA: NONE SEEN
BILIRUBIN URINE: NEGATIVE
Glucose, UA: NEGATIVE mg/dL
KETONES UR: NEGATIVE mg/dL
LEUKOCYTES UA: NEGATIVE
NITRITE: NEGATIVE
PROTEIN: NEGATIVE mg/dL
RBC / HPF: NONE SEEN RBC/hpf (ref 0–5)
SQUAMOUS EPITHELIAL / LPF: NONE SEEN
Specific Gravity, Urine: 1.006 (ref 1.005–1.030)
pH: 6 (ref 5.0–8.0)

## 2017-07-11 LAB — BASIC METABOLIC PANEL
Anion gap: 8 (ref 5–15)
BUN: 19 mg/dL (ref 6–20)
CHLORIDE: 102 mmol/L (ref 101–111)
CO2: 28 mmol/L (ref 22–32)
CREATININE: 0.92 mg/dL (ref 0.61–1.24)
Calcium: 8.8 mg/dL — ABNORMAL LOW (ref 8.9–10.3)
GFR calc non Af Amer: >60 mL/min (ref >60)
Glucose, Bld: 111 mg/dL — ABNORMAL HIGH (ref 65–99)
POTASSIUM: 4 mmol/L (ref 3.5–5.1)
Sodium: 138 mmol/L (ref 135–145)

## 2017-07-11 LAB — RAPID URINE DRUG SCREEN, HOSP PERFORMED
AMPHETAMINES: NOT DETECTED
BENZODIAZEPINES: NOT DETECTED
Barbiturates: NOT DETECTED
Cocaine: NOT DETECTED
OPIATES: NOT DETECTED
Tetrahydrocannabinol: NOT DETECTED

## 2017-07-11 LAB — MAGNESIUM: Magnesium: 1.9 mg/dL (ref 1.7–2.4)

## 2017-07-11 LAB — ECHOCARDIOGRAM COMPLETE
Height: 74 in
WEIGHTICAEL: 4976.01 [oz_av]

## 2017-07-11 LAB — PHOSPHORUS: Phosphorus: 4.5 mg/dL (ref 2.5–4.6)

## 2017-07-11 LAB — STREP PNEUMONIAE URINARY ANTIGEN: Strep Pneumo Urinary Antigen: NEGATIVE

## 2017-07-11 LAB — HIV ANTIBODY (ROUTINE TESTING W REFLEX): HIV SCREEN 4TH GENERATION: NONREACTIVE

## 2017-07-11 NOTE — Progress Notes (Signed)
  Echocardiogram 2D Echocardiogram has been performed.  Lillian Ballester 07/11/2017, 2:43 PM

## 2017-07-11 NOTE — Evaluation (Signed)
Clinical/Bedside Swallow Evaluation Patient Details  Name: Christian Hubbard MRN: [id number] Date of Birth: 1961/02/10  Today's Date: 07/11/2017 Time: SLP Start Time (ACUTE ONLY): 1440 SLP Stop Time (ACUTE ONLY): 1500 SLP Time Calculation (min) (ACUTE ONLY): 20 min  Past Medical History:  Past Medical History:  Diagnosis Date  . GERD (gastroesophageal reflux disease)   . Hyperlipemia   . Hypertension   . OSA on CPAP   . Pneumonia 08/10/2014; 07/10/2017   Past Surgical History:  Past Surgical History:  Procedure Laterality Date  . VIDEO BRONCHOSCOPY Bilateral 07/02/2017   Procedure: VIDEO BRONCHOSCOPY WITH FLUORO;  Surgeon: Rigoberto Noel, MD;  Location: [facility];  Service: Cardiopulmonary;  Laterality: Bilateral;   HPI:  56 y/o M, former smoker, with a PMH of solitary pulmonary nodule, OSA on CPAP, prior PNA and pneumatocele. Chest x rays reveal persistent multifocal bilateral pulmonary mass like densities are again noted. Persistent infiltrate right mid lung infiltrate and small bilateral pleural effusions again noted   Assessment / Plan / Recommendation Clinical Impression  Pts swallow appears within functional limits. No overt s/sx of aspiration with any consistencies trialed. SLP notes hx of recurrent multifocal PNA, right lobe greater than left. Recommend continue regular thin liquid diet. MBS objective testing may be warranted per MD discretion to rule out silent aspiration, though swallow function appeared strong at bedside. Please order MBS if further workup is desired by MD. ST to continue to monitor.  SLP Visit Diagnosis: Dysphagia, unspecified (R13.10)    Aspiration Risk  Mild aspiration risk    Diet Recommendation   Regular diet, thin liquids   Medication Administration: Whole meds with liquid    Other  Recommendations Oral Care Recommendations: Oral care BID   Follow up Recommendations None      Frequency and Duration min 1 x/week  1 week       Prognosis  Prognosis for Safe Diet Advancement: Good      Swallow Study   General Date of Onset: 07/10/17 HPI: 56 y/o M, former smoker, with a PMH of solitary pulmonary nodule, OSA on CPAP, prior PNA and pneumatocele. Chest x rays reveal persistent multifocal bilateral pulmonary mass like densities are again noted. Persistent infiltrate right mid lung infiltrate and small bilateral pleural effusions again noted Type of Study: Bedside Swallow Evaluation Previous Swallow Assessment: none on file Diet Prior to this Study: Regular;Thin liquids Temperature Spikes Noted: No Respiratory Status: Room air History of Recent Intubation: No Behavior/Cognition: Alert;Cooperative;Pleasant mood Oral Cavity Assessment: Within Functional Limits Oral Care Completed by SLP: No Oral Cavity - Dentition: Adequate natural dentition Vision: Functional for self-feeding Self-Feeding Abilities: Able to feed self Patient Positioning: Upright in bed Baseline Vocal Quality: Normal Volitional Cough: Strong Volitional Swallow: Able to elicit    Oral/Motor/Sensory Function Overall Oral Motor/Sensory Function: Within functional limits   Ice Chips Ice chips: Within functional limits   Thin Liquid Thin Liquid: Within functional limits    Nectar Thick Nectar Thick Liquid: Not tested   Honey Thick Honey Thick Liquid: Not tested   Puree Puree: Within functional limits   Solid   GO   Solid: Within functional limits        Arvil Chaco MA, CCC-SLP Acute Care Speech Language Pathologist    Levi Aland 07/11/2017,3:06 PM

## 2017-07-11 NOTE — Telephone Encounter (Signed)
Spoke with patient. He is aware that RA has been out of the office for some time and that I will give the forms to him to sign. He verbalized understanding. Will route to the RA so that he is aware for tomorrow in [place]. (Forms are in a lime green folder)

## 2017-07-11 NOTE — Telephone Encounter (Signed)
Patient stated he is at [facility]. Per Message I explained provider will sign on Thursday. Patient stated he need forms asap. [phone].

## 2017-07-11 NOTE — Progress Notes (Signed)
Name: Christian Hubbard MRN: [id number] DOB: 1961/08/23    ADMISSION DATE:  07/10/2017 CONSULTATION DATE:  07/10/17  REFERRING MD :  Dr. Elsworth Soho   CHIEF COMPLAINT:  Bilateral Infiltrates   HISTORY OF PRESENT ILLNESS:  56 y/o M, former smoker, with a PMH of solitary pulmonary nodule, OSA on CPAP, prior PNA and pneumatocele.   Initially developed PNA in 2014, which resolved with pneumatocele formation. In late June (6/26) he saw his PCP with concerns for PNA with increased fatigue, shortness of breath and right sided chest pain.  He also had lost ~ 30 lbs.  He was treated with Levaquin.  Given symptoms a CTA of the chest was assessed which was negative for PE but showed widespread multifocal airspace consolidation compatible with severe multilobar PNA with evidence of early cavitation in some regions . He followed up with his PCP 7/13 and CXR showed a worsening of opacity in the right mid lung.  He thinks he completed approximately 4 weeks of Levaquin.  He followed up in the Pulmonary office on 7/16 with ongoing SOB, fatigue.  Quantiferon gold assessed and negative, ANCA negative, WBC 9.8, Hgb 13.3, Sed rate 97.  He returned 7/18 for follow up and reported no resolution of symptoms.  Urine strep antigen was ordered but not completed. Given slow resolution a bronchoscopy was completed 8/6 which showed a normal upper airway, normal vocal cords, no significant secretions, no endobronchial lesions.  Transbronchial biopsies x2, brushings and BAL were obtained > cytology, cultures, AFB/Fungal (prelim) negative (final fungal / AFB pending).  A repeat CXR was completed on 8/13 which showed an interval worsening of right sided opacities with additional opacities on the left.    The patient was instructed to come to the hospital for direct admission.    SUBJECTIVE:  Feeling better this AM. Denies cough/sputum production.   VITAL SIGNS: Temp:  [98.7 F (37.1 C)-98.9 F (37.2 C)] 98.7 F (37.1 C) (08/15  0534) Pulse Rate:  [73-89] 73 (08/15 0534) Resp:  [18] 18 (08/15 0534) BP: (111-120)/(72-84) 111/72 (08/15 0534) SpO2:  [97 %-99 %] 97 % (08/15 0534) Weight:  [141.1 kg (311 lb)] 141.1 kg (311 lb) (08/15 0300)  PHYSICAL EXAMINATION: General: Adult overweight male in NAD HEENT: [place]/AT, PERRL, no JVD Neuro: Alert, oriented, grossly intact  CV: RRR, no MRG PULM: Clear bilateral  GI: soft, non-tender, non-distended.  Extremities: warm/dry, - edema  Skin: no rashes or lesions    Recent Labs Lab 07/10/17 2033 07/11/17 0402  NA 132* 138  K 3.6 4.0  CL 97* 102  CO2 28 28  BUN 12 19  CREATININE 1.09 0.92  GLUCOSE 108* 111*     Recent Labs Lab 07/10/17 2033 07/11/17 0402  HGB 11.8* 10.6*  HCT 37.1* 33.9*  WBC 9.5 9.2  PLT 407* 408*    Dg Chest 2 View  Result Date: 07/09/2017 CLINICAL DATA:  Follow-up of pneumonia. Persistent productive cough and shortness of breath. Former smoker. EXAM: CHEST  2 VIEW COMPARISON:  Chest x-ray of July 02, 2017 and chest CT scan of May 22, 2017. FINDINGS: The lungs are adequately inflated. There is increase conspicuity of confluent alveolar opacities in the right mid lung. Peripherally located subpleural increased density in the left mid lung is fairly stable. Small amounts of pleural fluid or pleural thickening blunt the costophrenic angles. The heart is normal in size. The pulmonary vascularity is not engorged. The trachea is midline. The bony thorax exhibits no acute abnormality. IMPRESSION: Interval  worsening of confluent alveolar opacity on the right with possible development of similar opacities on the left in addition to the area of stable change in the subpleural region on the left. The findings are suspicious for progressive pneumonia. Chest CT scanning would be a useful next imaging step and allow direct comparison to the study of May 22, 2017. Electronically Signed   By: David  Martinique M.D.   On: 07/09/2017 11:16   Ct Chest Wo  Contrast  Addendum Date: 07/11/2017   ADDENDUM REPORT: 07/11/2017 02:50 ADDENDUM: Additional differential. For the findings in the chest, given masslike appearance of the opacities, could also consider pulmonary lymphoma. Electronically Signed   By: Donavan Foil M.D.   On: 07/11/2017 02:50   Result Date: 07/11/2017 CLINICAL DATA:  Shortness of breath and fatigue for 2 months EXAM: CT CHEST WITHOUT CONTRAST TECHNIQUE: Multidetector CT imaging of the chest was performed following the standard protocol without IV contrast. COMPARISON:  Radiographs 07/09/2017, CT chest 05/22/2017, 12/18/2016, 12/09/2015, 04/16/2015 FINDINGS: Cardiovascular: Limited without intravenous contrast. Non aneurysmal aorta. Scattered atherosclerotic calcification. Coronary artery calcification. Normal heart size. No large pericardial effusion Mediastinum/Nodes: mild mediastinal adenopathy, with lymph nodes measuring up to 11 mm in the right paratracheal space. Midline trachea. No thyroid mass. Esophagus within normal limits Lungs/Pleura: Small left pleural effusion. Negative for pneumothorax. Thin walled cyst in the right lower lobe stable compared with multiple prior studies. Progressive consolidation in the right lower lobe since prior CT. Worsening, multifocal, largely peripheral consolidations and nodules are now visualized. Upper Abdomen: No acute abnormality is seen Musculoskeletal: No chest wall mass or suspicious bone lesions identified. IMPRESSION: 1. Small left pleural effusion. Progressive consolidation in the right lower lobe since prior CT. Development of multifocal, primarily peripheral consolidations and pulmonary nodules. Distribution not entirely typical for bacterial pneumonia. Differential considerations include septic emboli, fungal pneumonia, and given peripheral distribution, could consider organizing pneumonia or eosinophilic pneumonia. 2. Mild mediastinal adenopathy, likely reactive Aortic Atherosclerosis  (ICD10-I70.0). Electronically Signed: By: Donavan Foil M.D. On: 07/10/2017 22:57   Dg Chest Port 1 View  Result Date: 07/11/2017 CLINICAL DATA:  Bilateral pulmonary infiltrates. EXAM: PORTABLE CHEST 1 VIEW COMPARISON:  CT 07/10/2017.  Chest x-ray 08/13/ 2018. FINDINGS: Mediastinum hilar structures are stable. Stable mild cardiomegaly, no pulmonary venous congestion. Multifocal bilateral pulmonary mass like densities are again noted. Persistent infiltrate right mid lung again noted. Small bilateral pleural effusions. No interim change. No pneumothorax. No acute osseous abnormality. IMPRESSION: Persistent multifocal bilateral pulmonary mass like densities are again noted. Persistent infiltrate right mid lung infiltrate and small bilateral pleural effusions again noted. No interim change from prior exam. Electronically Signed   By: [place]   On: 07/11/2017 07:05   CULTURES BCx2 8/14 >>  Sputum 8/14 >> U. Strep 8/14 >>  U. Legionella 8/14 >>   ANTIBIOTICS Vanco 8/14 >>  Zosyn 8/14 >>  Azithro 8/14 >>   SIGNIFICANT EVENTS  6/26  Seen by PCP, reportedly rx'd levaquin  7/16  Seen in Pulmonary  7/18  Seen in Pulmonary  8/06  FOB  8/14  Admit for evaluation of infiltrates  STUDIES:  CTA Chest 05/22/17 >> neg for PE (motion limited), widespread multifocal airspace consolidation throughout bilateral lungs compatible with severe multilobar bronchopneumonia, with evidence of early cavitation in some regions, trace right pleural effusion, probable reactive mediastinal & hilar lymphadenopathy, severe hepatic steatosis, aortic atherosclerosis, in addition to left main and 2 vessel CAD  CT Chest 8/14 > Progressive consolidation in the  right lower lobe since prior CT. Development of multifocal, primarily peripheral consolidations and pulmonary nodules. Mild mediastinal adenopathy, likely reactive. SLP 8/14 >>    ASSESSMENT / PLAN:  Discussion:  56 y/o M with prior hx of PNA / pneumatocele with  repeat episode of of PNA that began in June of 2018.  He was treated with levaquin as an outpatient with additional work up as above.  He underwent an FOB 8/6 with negative cultures and cytology.  Despite multiple courses of outpatient antibiotics, he has had an interval worsening of airspace disease.    Right Community Acquired PNA, failed outpatient tx Bilateral Infiltrates, R>L Plan: Admit to med-surg floor  ABX as above (Zosyn, vanc, azithro) Follow cultures SLP to evaluate for possible aspiration Assess ECHO  Procalcitonin neg, continue to trend. Low threshold to deescalate ABX based on this.  ID to see.   OSA on CPAP  Plan: CPAP per home settings.   GERD  Plan: Maximize home PPI with concerns for aspiration.   HTN HLD Plan: Continue home norvasc, hyzaar, pravachol    DVT Prophylaxis - heparin, SCD's GI - protonix  Diet - regular diet as tolerated until seen by SLP Family Update - patient updated at bedside 8/15  Georgann Housekeeper, AGACNP-BC [facility] [place] [facility] Pager [phone] or [phone]  07/11/2017 10:38 AM   Attending Note:  I have examined patient, reviewed labs, studies and notes. I have discussed the case with Jaclynn Guarneri, and I agree with the data and plans as amended above. 56 year old man with history of sleep apnea and prior community acquired pneumonia in 2014. Has experienced multifocal patchy airspace disease concerning for possible recurrent pneumonia. Has been treated with antibiotics without resolution. He underwent cost can be on 8/6 that is thus far unrevealing. His infiltrates progressed radiographically and he was admitted for IV antibiotics on 8/14. His only complaint is fatigue and exertional dyspnea. On my evaluation he is awake, alert, well-appearing. He is comfortable on room air. His lungs are clear bilaterally. Heart is regular without a murmur. Abdomen soft. Extremities without edema. We will plan to continue his IV antibiotics  for now, follow his pulmonary infiltrates resolution. I discussed with him today my suspicion that course here is more consistent with a noninfectious cause. He may ultimately merit other workup including possible lung biopsy. Alternatively he may be a candidate for empiric steroids. We will discuss the overall plan with Dr Elsworth Soho.   Baltazar Apo, MD, PhD 07/11/2017, 2:24 PM [facility] [phone] or if no answer [phone]

## 2017-07-12 ENCOUNTER — Inpatient Hospital Stay (HOSPITAL_COMMUNITY): Payer: 59

## 2017-07-12 ENCOUNTER — Other Ambulatory Visit (HOSPITAL_COMMUNITY): Payer: 59

## 2017-07-12 LAB — LEGIONELLA PNEUMOPHILA SEROGP 1 UR AG: L. PNEUMOPHILA SEROGP 1 UR AG: NEGATIVE

## 2017-07-12 LAB — CBC
HEMATOCRIT: 35.6 % — AB (ref 39.0–52.0)
Hemoglobin: 11.2 g/dL — ABNORMAL LOW (ref 13.0–17.0)
MCH: 22.9 pg — AB (ref 26.0–34.0)
MCHC: 31.5 g/dL (ref 30.0–36.0)
MCV: 72.7 fL — AB (ref 78.0–100.0)
Platelets: 399 10*3/uL (ref 150–400)
RBC: 4.9 MIL/uL (ref 4.22–5.81)
RDW: 15.7 % — ABNORMAL HIGH (ref 11.5–15.5)
WBC: 7.6 10*3/uL (ref 4.0–10.5)

## 2017-07-12 LAB — RESPIRATORY CULTURE OR RESPIRATORY AND SPUTUM CULTURE: Gram Stain: NONE SEEN

## 2017-07-12 LAB — BASIC METABOLIC PANEL
Anion gap: 11 (ref 5–15)
BUN: 8 mg/dL (ref 6–20)
CHLORIDE: 103 mmol/L (ref 101–111)
CO2: 23 mmol/L (ref 22–32)
CREATININE: 0.84 mg/dL (ref 0.61–1.24)
Calcium: 8.5 mg/dL — ABNORMAL LOW (ref 8.9–10.3)
GFR calc Af Amer: >60 mL/min (ref >60)
GFR calc non Af Amer: >60 mL/min (ref >60)
Glucose, Bld: 103 mg/dL — ABNORMAL HIGH (ref 65–99)
POTASSIUM: 3.9 mmol/L (ref 3.5–5.1)
SODIUM: 137 mmol/L (ref 135–145)

## 2017-07-12 LAB — HCG, SERUM, QUALITATIVE: PREG SERUM: NEGATIVE

## 2017-07-12 LAB — PROCALCITONIN: Procalcitonin: <0.1 ng/mL

## 2017-07-12 NOTE — Telephone Encounter (Signed)
Spoke with patient. He is aware that RA has signed these forms. Because he is worried about his job, I will fax these papers to [employer] at [phone] (fax). Patient is aware of this. I will keep a copy in my look-at folder for a week to make sure she receives these.   I will give the original copies to Antoine once I return to [employer].

## 2017-07-12 NOTE — Progress Notes (Signed)
  Speech Language Pathology Treatment: Dysphagia  Patient Details Name: Christian Hubbard MRN: [id number] DOB: 26-Dec-1960 Today's Date: 07/12/2017 Time: 6681-5947 SLP Time Calculation (min) (ACUTE ONLY): 8 min  Assessment / Plan / Recommendation Clinical Impression  Pt completed the 3 oz water screen with no overt s/s of aspiration observed. He denies any difficulty during breakfast meal and although he does have a h/o GERD, he said that he has not experienced reflux symptoms in at least 4 years. Per critical care team note on previous date, MD reports that he suspects a noninfectious cause. SLP to sign off for now. If medical work up continues and there is persistent concern for aspiration, please order MBS or FEES.   HPI HPI: 56 y/o M, former smoker, with a PMH of solitary pulmonary nodule, OSA on CPAP, prior PNA and pneumatocele. Chest x rays reveal persistent multifocal bilateral pulmonary mass like densities are again noted. Persistent infiltrate right mid lung infiltrate and small bilateral pleural effusions again noted      SLP Plan  All goals met       Recommendations  Diet recommendations: Regular;Thin liquid Liquids provided via: Cup;Straw Medication Administration: Whole meds with liquid Supervision: Patient able to self feed Compensations: Slow rate;Small sips/bites;Follow solids with liquid Postural Changes and/or Swallow Maneuvers: Seated upright 90 degrees;Upright 30-60 min after meal                Oral Care Recommendations: Oral care BID Follow up Recommendations: None SLP Visit Diagnosis: Dysphagia, unspecified (R13.10) Plan: All goals met       GO                Germain Osgood 07/12/2017, 10:37 AM  Germain Osgood, M.A. CCC-SLP [phone]

## 2017-07-12 NOTE — Progress Notes (Signed)
Name: Christian Hubbard MRN: [id number] DOB: [birth year]    ADMISSION DATE:  07/10/2017 CONSULTATION DATE:  07/10/17  REFERRING MD :  Dr. Elsworth Soho   CHIEF COMPLAINT:  Bilateral Infiltrates   HISTORY OF PRESENT ILLNESS:  56 y/o M, former smoker, with a PMH of solitary pulmonary nodule, OSA on CPAP, prior PNA and pneumatocele.   Initially developed PNA in 2014, which resolved with pneumatocele formation. In late June (6/26) he saw his PCP with concerns for PNA with increased fatigue, shortness of breath and right sided chest pain.  He also had lost ~ 30 lbs.  He was treated with Levaquin.  Given symptoms a CTA of the chest was assessed which was negative for PE but showed widespread multifocal airspace consolidation compatible with severe multilobar PNA with evidence of early cavitation in some regions . He followed up with his PCP 7/13 and CXR showed a worsening of opacity in the right mid lung.  He thinks he completed approximately 4 weeks of Levaquin.  He followed up in the Pulmonary office on 7/16 with ongoing SOB, fatigue.  Quantiferon gold assessed and negative, ANCA negative, WBC 9.8, Hgb 13.3, Sed rate 97.  He returned 7/18 for follow up and reported no resolution of symptoms.  Urine strep antigen was ordered but not completed. Given slow resolution a bronchoscopy was completed 8/6 which showed a normal upper airway, normal vocal cords, no significant secretions, no endobronchial lesions.  Transbronchial biopsies x2, brushings and BAL were obtained > cytology, cultures, AFB/Fungal (prelim) negative (final fungal / AFB pending).  A repeat CXR was completed on 8/13 which showed an interval worsening of right sided opacities with additional opacities on the left.    The patient was instructed to come to the hospital for direct admission.    SUBJECTIVE:  No significant changes Tolerating IV abx Some nasal mucous, no real sputum  VITAL SIGNS: Temp:  [97.6 F (36.4 C)-98.9 F (37.2 C)] 98.5 F  (36.9 C) (08/16 0850) Pulse Rate:  [78-89] 87 (08/16 0850) Resp:  [16-18] 18 (08/16 0850) BP: (112-137)/(73-89) 115/73 (08/16 0850) SpO2:  [96 %-100 %] 96 % (08/16 0850) Weight:  [141 kg (310 lb 13.6 oz)] 141 kg (310 lb 13.6 oz) (08/15 2234)  PHYSICAL EXAMINATION: General: Adult overweight male in NAD HEENT: [place]/AT, PERRL, no JVD Neuro: Alert, oriented, grossly intact  CV: RRR, no MRG PULM: Clear bilateral  GI: soft, non-tender, non-distended.  Extremities: warm/dry, - edema  Skin: no rashes or lesions    Recent Labs Lab 07/10/17 2033 07/11/17 0402 07/12/17 0519  NA 132* 138 137  K 3.6 4.0 3.9  CL 97* 102 103  CO2 _0 BUN _1 CREATININE 1.09 0.92 0.84  GLUCOSE 108* 111* 103*     Recent Labs Lab 07/10/17 2033 07/11/17 0402 07/12/17 0519  HGB 11.8* 10.6* 11.2*  HCT 37.1* 33.9* 35.6*  WBC 9.5 9.2 7.6  PLT 407* 408* 399    Ct Chest Wo Contrast  Addendum Date: 07/11/2017   ADDENDUM REPORT: 07/11/2017 02:50 ADDENDUM: Additional differential. For the findings in the chest, given masslike appearance of the opacities, could also consider pulmonary lymphoma. Electronically Signed   By: Donavan Foil M.D.   On: 07/11/2017 02:50   Result Date: 07/11/2017 CLINICAL DATA:  Shortness of breath and fatigue for 2 months EXAM: CT CHEST WITHOUT CONTRAST TECHNIQUE: Multidetector CT imaging of the chest was performed following the standard protocol without IV contrast. COMPARISON:  Radiographs 07/09/2017, CT  chest 05/22/2017, 12/18/2016, 12/09/2015, 04/16/2015 FINDINGS: Cardiovascular: Limited without intravenous contrast. Non aneurysmal aorta. Scattered atherosclerotic calcification. Coronary artery calcification. Normal heart size. No large pericardial effusion Mediastinum/Nodes: mild mediastinal adenopathy, with lymph nodes measuring up to 11 mm in the right paratracheal space. Midline trachea. No thyroid mass. Esophagus within normal limits Lungs/Pleura: Small left pleural  effusion. Negative for pneumothorax. Thin walled cyst in the right lower lobe stable compared with multiple prior studies. Progressive consolidation in the right lower lobe since prior CT. Worsening, multifocal, largely peripheral consolidations and nodules are now visualized. Upper Abdomen: No acute abnormality is seen Musculoskeletal: No chest wall mass or suspicious bone lesions identified. IMPRESSION: 1. Small left pleural effusion. Progressive consolidation in the right lower lobe since prior CT. Development of multifocal, primarily peripheral consolidations and pulmonary nodules. Distribution not entirely typical for bacterial pneumonia. Differential considerations include septic emboli, fungal pneumonia, and given peripheral distribution, could consider organizing pneumonia or eosinophilic pneumonia. 2. Mild mediastinal adenopathy, likely reactive Aortic Atherosclerosis (ICD10-I70.0). Electronically Signed: By: Donavan Foil M.D. On: 07/10/2017 22:57   Dg Chest Port 1 View  Result Date: 07/11/2017 CLINICAL DATA:  Bilateral pulmonary infiltrates. EXAM: PORTABLE CHEST 1 VIEW COMPARISON:  CT 07/10/2017.  Chest x-ray 08/13/ 2018. FINDINGS: Mediastinum hilar structures are stable. Stable mild cardiomegaly, no pulmonary venous congestion. Multifocal bilateral pulmonary mass like densities are again noted. Persistent infiltrate right mid lung again noted. Small bilateral pleural effusions. No interim change. No pneumothorax. No acute osseous abnormality. IMPRESSION: Persistent multifocal bilateral pulmonary mass like densities are again noted. Persistent infiltrate right mid lung infiltrate and small bilateral pleural effusions again noted. No interim change from prior exam. Electronically Signed   By: [place]   On: 07/11/2017 07:05   CULTURES BCx2 8/14 >>  Sputum 8/14 >> U. Strep 8/14 >> negative U. Legionella 8/14 >>   ANTIBIOTICS Vanco 8/14 >>  Zosyn 8/14 >>  Azithro 8/14 >>    SIGNIFICANT EVENTS  6/26  Seen by PCP, reportedly rx'd levaquin  7/16  Seen in Pulmonary  7/18  Seen in Pulmonary  8/06  FOB  8/14  Admit for evaluation of infiltrates  STUDIES:  CTA Chest 05/22/17 >> neg for PE (motion limited), widespread multifocal airspace consolidation throughout bilateral lungs compatible with severe multilobar bronchopneumonia, with evidence of early cavitation in some regions, trace right pleural effusion, probable reactive mediastinal & hilar lymphadenopathy, severe hepatic steatosis, aortic atherosclerosis, in addition to left main and 2 vessel CAD  CT Chest 8/14 > Progressive consolidation in the right lower lobe since prior CT. Development of multifocal, primarily peripheral consolidations and pulmonary nodules. Mild mediastinal adenopathy, likely reactive. SLP 8/14 >>  UDS 8/14 >> negative   ASSESSMENT / PLAN:  Discussion:  56 y/o M with prior hx of PNA / pneumatocele with repeat episode of of PNA that began in June of 2018.  He was treated with levaquin as an outpatient with additional work up as above.  He underwent an FOB 8/6 with negative cultures and cytology.  Despite multiple courses of outpatient antibiotics, he has had an interval worsening of airspace disease.    Right Community Acquired PNA, failed outpatient tx Bilateral Infiltrates, R>L Plan: Continue current IV abx CXR on 8/17 - if not changing then I would favor either bx or empiric steroids. Will discuss w Dr Elsworth Soho Follow cultures SLP to evaluate for possible aspiration  OSA on CPAP  Plan: CPAP per home settings.   GERD  Plan: Home PPI  HTN  HLD Plan: Continue current regimen   DVT Prophylaxis - heparin, SCD's GI - protonix  Diet - regular diet as tolerated until seen by SLP Family Update - patient updated at bedside 8/16   Baltazar Apo, MD, PhD 07/12/2017, 11:06 AM [facility] [phone] or if no answer [phone]

## 2017-07-12 NOTE — Telephone Encounter (Signed)
done

## 2017-07-12 NOTE — Progress Notes (Signed)
Pt has home CPAP places on self.

## 2017-07-13 ENCOUNTER — Telehealth: Payer: Self-pay | Admitting: Pulmonary Disease

## 2017-07-13 ENCOUNTER — Inpatient Hospital Stay (HOSPITAL_COMMUNITY): Payer: 59

## 2017-07-13 ENCOUNTER — Ambulatory Visit: Payer: 59 | Admitting: Adult Health

## 2017-07-13 LAB — VANCOMYCIN, TROUGH: Vancomycin Tr: 14 ug/mL — ABNORMAL LOW (ref 15–20)

## 2017-07-13 MED ORDER — VANCOMYCIN HCL IN DEXTROSE 1-5 GM/200ML-% IV SOLN
1000.0000 mg | Freq: Three times a day (TID) | INTRAVENOUS | Status: DC
  Administered 2017-07-13 – 2017-07-15 (×6): 1000 mg via INTRAVENOUS
  Filled 2017-07-13 (×7): qty 200

## 2017-07-13 NOTE — Progress Notes (Signed)
Pharmacy Antibiotic Note  Christian Hubbard is a 56 y.o. male admitted on 07/10/2017 with pneumonia.  Pharmacy has been consulted for Vancomycin / Zosyn dosing.  CXR today show stable bilateral opacities. A vancomycin trough drawn this morning was just below goal at 50mg/mL. Renal function has improved slightly since admission.  Plan: Zosyn 3.375g IV q8h EI Change Vancomycin to 1g IV q8h- same overall total daily dose, but should achieve higher trough with shortened interval Follow up clinical progression, cultures/sensitivities, renal function, pulmonary plans  Height: _0  (188 cm) Weight: 274 lb 7.6 oz (124.5 kg) IBW/kg (Calculated) : 82.2  Temp (24hrs), Avg:98.6 F (37 C), Min:98.2 F (36.8 C), Max:99 F (37.2 C)   Recent Labs Lab 07/10/17 2033 07/11/17 0402 07/12/17 0519 07/13/17 0456  WBC 9.5 9.2 7.6  --   CREATININE 1.09 0.92 0.84  --   VANCOTROUGH  --   --   --  14*    Estimated Creatinine Clearance: 142.6 mL/min (by C-G formula based on SCr of 0.84 mg/dL).    No Known Allergies  Antimicrobials this admission: Vancomycin 8/14>> Zosyn 8/14>> Azithromycin 8/14>>  Dose adjustments this admission: 8/17 VT = 17 on 15018mIV q12h > changed to 1g IV q8h  Microbiology results: 8/14 BCx: ngtd 8/13 AFB sputum: no AFB seen, cx pending 8/13 sputum: few GPC in chains > reincubated 8/6 AFB BAL: no AFB seen, cx pending 8/6 fungus: neg 8/6 BAL: nml flora  Thank you for allowing pharmacy to be a part of this patient's care.  Harris Kistler D. Jahzeel Poythress, PharmD, BCPS Clinical Pharmacist Pager: [phone] Phone for 07/13/2017 until 3:30pm: [phone] If after 3:30pm, please call main pharmacy at [phone] 07/13/2017 8:33 AM

## 2017-07-13 NOTE — Progress Notes (Signed)
Name: Christian Hubbard MRN: [id number] DOB: 05-12-1961    ADMISSION DATE:  07/10/2017 CONSULTATION DATE:  07/10/17  REFERRING MD :  Dr. Elsworth Soho   CHIEF COMPLAINT:  Bilateral Infiltrates   HISTORY OF PRESENT ILLNESS:  56 y/o M, former smoker, with a PMH of solitary pulmonary nodule, OSA on CPAP, prior PNA and pneumatocele.   Initially developed PNA in 2014, which resolved with pneumatocele formation. In late June (6/26) he saw his PCP with concerns for PNA with increased fatigue, shortness of breath and right sided chest pain.  He also had lost ~ 30 lbs.  He was treated with Levaquin.  Given symptoms a CTA of the chest was assessed which was negative for PE but showed widespread multifocal airspace consolidation compatible with severe multilobar PNA with evidence of early cavitation in some regions . He followed up with his PCP 7/13 and CXR showed a worsening of opacity in the right mid lung.  He thinks he completed approximately 4 weeks of Levaquin.  He followed up in the Pulmonary office on 7/16 with ongoing SOB, fatigue.  Quantiferon gold assessed and negative, ANCA negative, WBC 9.8, Hgb 13.3, Sed rate 97.  He returned 7/18 for follow up and reported no resolution of symptoms.  Urine strep antigen was ordered but not completed. Given slow resolution a bronchoscopy was completed 8/6 which showed a normal upper airway, normal vocal cords, no significant secretions, no endobronchial lesions.  Transbronchial biopsies x2, brushings and BAL were obtained > cytology, cultures, AFB/Fungal (prelim) negative (final fungal / AFB pending).  A repeat CXR was completed on 8/13 which showed an interval worsening of right sided opacities with additional opacities on the left.    The patient was instructed to come to the hospital for direct admission.    SUBJECTIVE:  States he feels the same..never felt bad. Tolerating IV abx Scant amounts of yellow sputum this morning. Has specimen collection cup. CXR with  stable infiltrates.  VITAL SIGNS: Temp:  [98.2 F (36.8 C)-99 F (37.2 C)] 98.5 F (36.9 C) (08/17 1006) Pulse Rate:  [72-89] 72 (08/17 1006) Resp:  [16-18] 16 (08/17 1006) BP: (111-125)/(70-85) 125/80 (08/17 1006) SpO2:  [96 %-98 %] 97 % (08/17 1006) Weight:  [274 lb 7.6 oz (124.5 kg)-274 lb 8 oz (124.5 kg)] 274 lb 7.6 oz (124.5 kg) (08/17 0434)  PHYSICAL EXAMINATION: General: Awake alert male, sitting on side of bed, in NAD HEENT: [place]/AT, PERRL, no JVD, normocephalic, atraumatic Neuro: Alert, oriented, grossly intact  CV: RRR, no MRG PULM: Clear bilaterally, diminished per bases  GI: soft, non-tender, non-distended, obese.  Extremities: warm/dry, - edema, left middle digits wrapped in bandage. Skin: no rashes or lesions, warm, dry and intact    Recent Labs Lab 07/10/17 2033 07/11/17 0402 07/12/17 0519  NA 132* 138 137  K 3.6 4.0 3.9  CL 97* 102 103  CO2 _0 BUN _1 CREATININE 1.09 0.92 0.84  GLUCOSE 108* 111* 103*     Recent Labs Lab 07/10/17 2033 07/11/17 0402 07/12/17 0519  HGB 11.8* 10.6* 11.2*  HCT 37.1* 33.9* 35.6*  WBC 9.5 9.2 7.6  PLT 407* 408* 399    Dg Chest 2 View  Result Date: 07/13/2017 CLINICAL DATA:  Shortness of Breath EXAM: CHEST  2 VIEW COMPARISON:  07/11/2017 FINDINGS: Cardiac shadow is stable. Bilateral parenchymal densities are again identified. Given some variation in positioning overall appearance is stable. No new focal abnormality is noted. IMPRESSION: Stable bilateral pulmonary  opacities. Electronically Signed   By: Inez Catalina M.D.   On: 07/13/2017 07:19   CULTURES BCx2 8/14 >>  Sputum 8/14 >>NORF U. Strep 8/14 >> negative U. Legionella 8/14 >>   ANTIBIOTICS Vanco 8/14 >>  Zosyn 8/14 >>  Azithro 8/14 >>   SIGNIFICANT EVENTS  6/26  Seen by PCP, reportedly rx'd levaquin  7/16  Seen in Pulmonary  7/18  Seen in Pulmonary  8/06  FOB  8/14  Admit for evaluation of infiltrates  STUDIES:  CTA Chest 05/22/17 >> neg  for PE (motion limited), widespread multifocal airspace consolidation throughout bilateral lungs compatible with severe multilobar bronchopneumonia, with evidence of early cavitation in some regions, trace right pleural effusion, probable reactive mediastinal & hilar lymphadenopathy, severe hepatic steatosis, aortic atherosclerosis, in addition to left main and 2 vessel CAD  CT Chest 8/14 > Progressive consolidation in the right lower lobe since prior CT. Development of multifocal, primarily peripheral consolidations and pulmonary nodules. Mild mediastinal adenopathy, likely reactive. SLP 8/14 >>  UDS 8/14 >> negative   ASSESSMENT / PLAN:  Discussion:  56 y/o M with prior hx of PNA / pneumatocele with repeat episode of of PNA that began in June of 2018.  He was treated with levaquin as an outpatient with additional work up as above.  He underwent an FOB 8/6 with negative cultures and cytology.  Despite multiple courses of outpatient antibiotics, he has had an interval worsening of airspace disease.    Right Community Acquired PNA, failed outpatient tx Bilateral Infiltrates, R>L Plan: Continue current IV abx CXR on 8/17 unchanged  -  favor either bx or empiric steroids. Will discuss w Dr Elsworth Soho Follow micro SLP to evaluate for possible aspiration  OSA on CPAP  Plan: CPAP per home settings.   GERD  Plan: Home PPI  HTN HLD Plan: Continue current regimen   DVT Prophylaxis - heparin, SCD's GI - protonix  Diet - regular diet as tolerated until seen by SLP Family Update - patient updated at bedside 8/16   Magdalen Spatz, [place] 07/13/2017, 1:01 PM [facility] Pager # [phone]  Attending Note:  I have examined patient, reviewed labs, studies and notes. I have discussed the case with Gladstone Pih, and I agree with the data and plans as amended above. 56 year old man with unexplained bilateral infiltrates. He has not responded to standard therapy for possible  pneumonia. He was admitted for blood cultures to evaluate possible embolic process, IV antibiotics. He has not clinically changed. Blood cultures are negative so far. Chest x-ray has not improved. I am becoming less convinced that this is a bacterial infection. I suspect that he will need repeat biopsy. Either repeat bronchoscopy or possibly even VATS biopsy. We will plan to continue IV antibiotics through the weekend and then discuss plan for further workup with Dr Elsworth Soho.   Baltazar Apo, MD, PhD 07/13/2017, 10:54 PM [facility] [phone] or if no answer [phone]

## 2017-07-13 NOTE — Telephone Encounter (Signed)
Called and spoke with pt and he stated that the forms that were faxed over for the FMLA will not be approved due to the dates and times not being written on the form of when he may need to be out.  He stated that the lady from HR leaves today at 3pm.  Spoke with Cherina and she stated that RA will be back in this afternoon and she will have him review these forms.  RA please advise. thanks

## 2017-07-13 NOTE — Progress Notes (Signed)
Pt has home CPAP and self administers. Advised pt to call if needed anything.

## 2017-07-14 ENCOUNTER — Telehealth: Payer: Self-pay | Admitting: Adult Health

## 2017-07-14 DIAGNOSIS — J9601 Acute respiratory failure with hypoxia: Secondary | ICD-10-CM

## 2017-07-14 LAB — BASIC METABOLIC PANEL
ANION GAP: 10 (ref 5–15)
BUN: 7 mg/dL (ref 6–20)
CHLORIDE: 102 mmol/L (ref 101–111)
CO2: 25 mmol/L (ref 22–32)
Calcium: 8.9 mg/dL (ref 8.9–10.3)
Creatinine, Ser: 0.9 mg/dL (ref 0.61–1.24)
GFR calc non Af Amer: >60 mL/min (ref >60)
Glucose, Bld: 107 mg/dL — ABNORMAL HIGH (ref 65–99)
Potassium: 4.2 mmol/L (ref 3.5–5.1)
SODIUM: 137 mmol/L (ref 135–145)

## 2017-07-14 LAB — CBC
HEMATOCRIT: 35.3 % — AB (ref 39.0–52.0)
Hemoglobin: 10.9 g/dL — ABNORMAL LOW (ref 13.0–17.0)
MCH: 22.7 pg — AB (ref 26.0–34.0)
MCHC: 30.9 g/dL (ref 30.0–36.0)
MCV: 73.5 fL — AB (ref 78.0–100.0)
PLATELETS: 379 10*3/uL (ref 150–400)
RBC: 4.8 MIL/uL (ref 4.22–5.81)
RDW: 16.1 % — ABNORMAL HIGH (ref 11.5–15.5)
WBC: 8.6 10*3/uL (ref 4.0–10.5)

## 2017-07-14 LAB — PROCALCITONIN: Procalcitonin: <0.1 ng/mL

## 2017-07-14 LAB — EXPECTORATED SPUTUM ASSESSMENT W GRAM STAIN, RFLX TO RESP C

## 2017-07-14 LAB — EXPECTORATED SPUTUM ASSESSMENT W REFEX TO RESP CULTURE

## 2017-07-14 NOTE — Telephone Encounter (Signed)
Pt needs FMLA papers ASAP . Please check with Dr. Elsworth Soho  Regarding missed info.  Pt is currently in hospital . Please follow up with this asap as job is in [place] .

## 2017-07-14 NOTE — Progress Notes (Signed)
Pt. Has home cpap, able to operate himself.

## 2017-07-14 NOTE — Progress Notes (Signed)
Name: Christian Hubbard MRN: [id number] DOB: 08/06/1961    ADMISSION DATE:  07/10/2017 CONSULTATION DATE:  07/10/17  REFERRING MD :  Dr. Elsworth Soho   CHIEF COMPLAINT:  Bilateral Infiltrates   HISTORY OF PRESENT ILLNESS:  56 y/o M, former smoker, with a PMH of solitary pulmonary nodule, OSA on CPAP, prior PNA and pneumatocele.   Initially developed PNA in 2014, which resolved with pneumatocele formation. In late June (6/26) he saw his PCP with concerns for PNA with increased fatigue, shortness of breath and right sided chest pain.  He also had lost ~ 30 lbs.  He was treated with Levaquin.  Given symptoms a CTA of the chest was assessed which was negative for PE but showed widespread multifocal airspace consolidation compatible with severe multilobar PNA with evidence of early cavitation in some regions . He followed up with his PCP 7/13 and CXR showed a worsening of opacity in the right mid lung.  He thinks he completed approximately 4 weeks of Levaquin.  He followed up in the Pulmonary office on 7/16 with ongoing SOB, fatigue.  Quantiferon gold assessed and negative, ANCA negative, WBC 9.8, Hgb 13.3, Sed rate 97.  He returned 7/18 for follow up and reported no resolution of symptoms.  Urine strep antigen was ordered but not completed. Given slow resolution a bronchoscopy was completed 8/6 which showed a normal upper airway, normal vocal cords, no significant secretions, no endobronchial lesions.  Transbronchial biopsies x2, brushings and BAL were obtained > cytology, cultures, AFB/Fungal (prelim) negative (final fungal / AFB pending).  A repeat CXR was completed on 8/13 which showed an interval worsening of right sided opacities with additional opacities on the left.    The patient was instructed to come to the hospital for direct admission.    SUBJECTIVE:  Feels okay,. No cough or wheezing .  Tolerating IV abx. No nv/d.  Eating good.    VITAL SIGNS: Temp:  [98.6 F (37 C)-100.8 F (38.2 C)]  98.7 F (37.1 C) (08/18 0900) Pulse Rate:  [78-87] 78 (08/18 0900) Resp:  [16-18] 18 (08/18 0900) BP: (116-134)/(68-84) 124/68 (08/18 0900) SpO2:  [95 %-98 %] 98 % (08/18 0900) Weight:  [272 lb (123.4 kg)] 272 lb (123.4 kg) (08/18 0536)  PHYSICAL EXAMINATION: General: Awake alert male, sitting on side of bed, in NAD HEENT: [place]/AT, PERRL, no JVD, normocephalic, atraumatic Neuro: Alert, oriented, grossly intact  CV: RRR, no MRG PULM: Clear bilaterally, diminished per bases  GI: soft, non-tender, non-distended, obese.  Extremities: warm/dry, - edema, left middle digits wrapped in bandage. Skin: no rashes or lesions, warm, dry and intact    Recent Labs Lab 07/11/17 0402 07/12/17 0519 07/14/17 0526  NA 138 137 137  K 4.0 3.9 4.2  CL 102 103 102  CO2 _0 BUN _1 CREATININE 0.92 0.84 0.90  GLUCOSE 111* 103* 107*     Recent Labs Lab 07/11/17 0402 07/12/17 0519 07/14/17 0526  HGB 10.6* 11.2* 10.9*  HCT 33.9* 35.6* 35.3*  WBC 9.2 7.6 8.6  PLT 408* 399 379    Dg Chest 2 View  Result Date: 07/13/2017 CLINICAL DATA:  Shortness of Breath EXAM: CHEST  2 VIEW COMPARISON:  07/11/2017 FINDINGS: Cardiac shadow is stable. Bilateral parenchymal densities are again identified. Given some variation in positioning overall appearance is stable. No new focal abnormality is noted. IMPRESSION: Stable bilateral pulmonary opacities. Electronically Signed   By: Inez Catalina M.D.   On: 07/13/2017 07:19  CULTURES BCx2 8/14 >>  Sputum 8/14 >>NORF U. Strep 8/14 >> negative U. Legionella 8/14 >> neg   ANTIBIOTICS Vanco 8/14 >>  Zosyn 8/14 >>  Azithro 8/14 >>   SIGNIFICANT EVENTS  6/26  Seen by PCP, reportedly rx'd levaquin  7/16  Seen in Pulmonary  7/18  Seen in Pulmonary  8/06  FOB  8/14  Admit for evaluation of infiltrates  STUDIES:  CTA Chest 05/22/17 >> neg for PE (motion limited), widespread multifocal airspace consolidation throughout bilateral lungs compatible with  severe multilobar bronchopneumonia, with evidence of early cavitation in some regions, trace right pleural effusion, probable reactive mediastinal & hilar lymphadenopathy, severe hepatic steatosis, aortic atherosclerosis, in addition to left main and 2 vessel CAD  CT Chest 8/14 > Progressive consolidation in the right lower lobe since prior CT. Development of multifocal, primarily peripheral consolidations and pulmonary nodules. Mild mediastinal adenopathy, likely reactive. SLP 8/14 >> nml  UDS 8/14 >> negative   ASSESSMENT / PLAN:  Discussion:  56 y/o M with prior hx of PNA / pneumatocele with repeat episode of of PNA that began in June of 2018.  He was treated with levaquin as an outpatient with additional work up as above.  He underwent an FOB 8/6 with negative cultures and cytology.  Despite multiple courses of outpatient antibiotics, he has had an interval worsening of airspace disease.    Right Community Acquired PNA, failed outpatient tx Bilateral Infiltrates, R>L. ESR 97 in July  Swallow eval nml 8/16.  Plan: Continue current IV abx CXR on 8/17 unchanged  -  favor either bx or empiric steroids. Will discuss w Dr Elsworth Soho Follow micro Check ESR  Check cxr on Monday 8/20 .    OSA on CPAP  Plan: CPAP per home settings.   GERD  Plan: Home PPI  HTN HLD Plan: Continue current regimen   DVT Prophylaxis - heparin, SCD's GI - protonix  Diet - regular diet as tolerated  Family Update - patient updated at bedside 8/16   Tammy Parrett NP-C  [facility]  [phone]  07/14/2017, 11:59 AM   STAFF NOTE: I, Merrie Roof, MD FACP have personally reviewed patient's available data, including medical history, events of note, physical examination and test results as part of my evaluation. I have discussed with resident/NP and other care providers such as pharmacist, RN and RRT. In addition, I personally evaluated patient and elicited key findings of: awake,  alert, no distress at rest, JVD wnl, no fevers, no secretions, lung exam with CTA no sig coarse bs, mild lymphad neck, pcxr I reviewed shows unchanged infiltrate rt, ESR was noted, remains culture negative, maintain current abx regimen, follow esr, this may be BOOP or pneumonitis, he has made little progress with abx, follow pcxr Monday, consider dc vanc soon, may need open lung bx vs steroid course, I updated pt I nfull Lavon Paganini. Titus Mould, MD, [place] Pgr: [facility] 07/14/2017 12:46 PM

## 2017-07-15 ENCOUNTER — Inpatient Hospital Stay (HOSPITAL_COMMUNITY): Payer: 59

## 2017-07-15 DIAGNOSIS — J9600 Acute respiratory failure, unspecified whether with hypoxia or hypercapnia: Secondary | ICD-10-CM

## 2017-07-15 LAB — BASIC METABOLIC PANEL
ANION GAP: 9 (ref 5–15)
BUN: 7 mg/dL (ref 6–20)
CALCIUM: 8.8 mg/dL — AB (ref 8.9–10.3)
CO2: 26 mmol/L (ref 22–32)
Chloride: 101 mmol/L (ref 101–111)
Creatinine, Ser: 0.94 mg/dL (ref 0.61–1.24)
GLUCOSE: 121 mg/dL — AB (ref 65–99)
POTASSIUM: 3.4 mmol/L — AB (ref 3.5–5.1)
Sodium: 136 mmol/L (ref 135–145)

## 2017-07-15 LAB — CULTURE, BLOOD (ROUTINE X 2)
CULTURE: NO GROWTH
Culture: NO GROWTH
SPECIAL REQUESTS: ADEQUATE
Special Requests: ADEQUATE

## 2017-07-15 LAB — GLUCOSE, CAPILLARY
GLUCOSE-CAPILLARY: 94 mg/dL (ref 65–99)
Glucose-Capillary: 149 mg/dL — ABNORMAL HIGH (ref 65–99)

## 2017-07-15 LAB — SEDIMENTATION RATE: Sed Rate: 90 mm/hr — ABNORMAL HIGH (ref 0–16)

## 2017-07-15 LAB — VANCOMYCIN, TROUGH: VANCOMYCIN TR: 15 ug/mL (ref 15–20)

## 2017-07-15 MED ORDER — POTASSIUM CHLORIDE CRYS ER 20 MEQ PO TBCR
20.0000 meq | EXTENDED_RELEASE_TABLET | Freq: Once | ORAL | Status: AC
  Administered 2017-07-15: 20 meq via ORAL
  Filled 2017-07-15: qty 1

## 2017-07-15 MED ORDER — INSULIN ASPART 100 UNIT/ML ~~LOC~~ SOLN
0.0000 [IU] | Freq: Three times a day (TID) | SUBCUTANEOUS | Status: DC
  Administered 2017-07-16: 1 [IU] via SUBCUTANEOUS
  Administered 2017-07-16: 2 [IU] via SUBCUTANEOUS
  Administered 2017-07-16: 1 [IU] via SUBCUTANEOUS
  Administered 2017-07-17: 2 [IU] via SUBCUTANEOUS
  Administered 2017-07-17: 1 [IU] via SUBCUTANEOUS

## 2017-07-15 MED ORDER — METHYLPREDNISOLONE SODIUM SUCC 40 MG IJ SOLR
40.0000 mg | Freq: Two times a day (BID) | INTRAMUSCULAR | Status: DC
  Administered 2017-07-15 – 2017-07-17 (×4): 40 mg via INTRAVENOUS
  Filled 2017-07-15 (×4): qty 1

## 2017-07-15 NOTE — Progress Notes (Signed)
Pharmacy Antibiotic Note Christian Hubbard is a 57 y.o. male admitted on 07/10/2017 with pneumonia. Currently on day 6 of Zosyn and vancomycin.   Vancomycin trough therapeutic at 15 this morning on dose of 1 gram IV every 8 hours. SCr remains stable.   Plan: 1. Continue vancomycin 1000 mg IV every 8 hours  2. Zosyn 3.375g IV q8h EI 3. Azithromycin 500 mg IV every 24 hours  4. Follow up clinical progression, cultures/sensitivities, renal function, pulmonary plans  Height: _0  (188 cm) Weight: 272 lb 4.3 oz (123.5 kg) IBW/kg (Calculated) : 82.2  Temp (24hrs), Avg:98.6 F (37 C), Min:98.4 F (36.9 C), Max:98.7 F (37.1 C)   Recent Labs Lab 07/10/17 2033 07/11/17 0402 07/12/17 0519 07/13/17 0456 07/14/17 0526 07/15/17 0237 07/15/17 0726  WBC 9.5 9.2 7.6  --  8.6  --   --   CREATININE 1.09 0.92 0.84  --  0.90 0.94  --   VANCOTROUGH  --   --   --  14*  --   --  15    Estimated Creatinine Clearance: 126.9 mL/min (by C-G formula based on SCr of 0.94 mg/dL).    No Known Allergies  Antimicrobials this admission: Vancomycin 8/14>> Zosyn 8/14>> Azithromycin 8/14>>  Dose adjustments this admission: 8/17 VT = 17 on 1523m IV q12h > changed to 1g IV q8h 8/19 VT = 15 on 1 gram IV q8h > no change   Microbiology results: 8/14 BCx: ngtd 8/13 AFB sputum: no AFB seen, cx pending 8/13 sputum: few GPC in chains > reincubated 8/6 AFB BAL: no AFB seen, cx pending 8/6 fungus: neg 8/6 BAL: nml flora  Thank you for allowing pharmacy to be a part of this patient's care.  AVincenza Hews PharmD, BCPS 07/15/2017, 10:49 AM

## 2017-07-15 NOTE — Progress Notes (Addendum)
Name: Christian Hubbard MRN: [id number] DOB: 1961/09/25    ADMISSION DATE:  07/10/2017 CONSULTATION DATE:  07/10/17  REFERRING MD :  Dr. Elsworth Soho   CHIEF COMPLAINT:  Bilateral Infiltrates   HISTORY OF PRESENT ILLNESS:  56 y/o M, former smoker, with a PMH of solitary pulmonary nodule, OSA on CPAP, prior PNA and pneumatocele.   Initially developed PNA in 2014, which resolved with pneumatocele formation. In late June (6/26) he saw his PCP with concerns for PNA with increased fatigue, shortness of breath and right sided chest pain.  He also had lost ~ 30 lbs.  He was treated with Levaquin.  Given symptoms a CTA of the chest was assessed which was negative for PE but showed widespread multifocal airspace consolidation compatible with severe multilobar PNA with evidence of early cavitation in some regions . He followed up with his PCP 7/13 and CXR showed a worsening of opacity in the right mid lung.  He thinks he completed approximately 4 weeks of Levaquin.  He followed up in the Pulmonary office on 7/16 with ongoing SOB, fatigue.  Quantiferon gold assessed and negative, ANCA negative, WBC 9.8, Hgb 13.3, Sed rate 97.  He returned 7/18 for follow up and reported no resolution of symptoms.  Urine strep antigen was ordered but not completed. Given slow resolution a bronchoscopy was completed 8/6 which showed a normal upper airway, normal vocal cords, no significant secretions, no endobronchial lesions.  Transbronchial biopsies x2, brushings and BAL were obtained > cytology, cultures, AFB/Fungal (prelim) negative (final fungal / AFB pending).  A repeat CXR was completed on 8/13 which showed an interval worsening of right sided opacities with additional opacities on the left.    The patient was instructed to come to the hospital for direct admission.    SUBJECTIVE:  No change  ESR remains elevated 90-97.  Eosinophils elevated previously on CBC    VITAL SIGNS: Temp:  [98.4 F (36.9 C)-98.7 F (37.1 C)]  98.6 F (37 C) (08/19 0921) Pulse Rate:  [75-91] 85 (08/19 0921) Resp:  [18] 18 (08/19 0921) BP: (119-133)/(77-83) 125/80 (08/19 0921) SpO2:  [97 %] 97 % (08/19 0921) Weight:  [272 lb 4.3 oz (123.5 kg)] 272 lb 4.3 oz (123.5 kg) (08/18 2235)  PHYSICAL EXAMINATION: General: Awake alert male, sitting on side of bed, in NAD HEENT: [place]/AT, PERRL, no JVD, normocephalic, atraumatic Neuro: Alert, oriented, grossly intact  CV: RRR, no MRG PULM: Clear bilaterally, diminished per bases  GI: soft, non-tender, non-distended, obese.  Extremities: warm/dry, - edema, left middle digits wrapped in bandage. Skin: no rashes or lesions, warm, dry and intact    Recent Labs Lab 07/12/17 0519 07/14/17 0526 07/15/17 0237  NA 137 137 136  K 3.9 4.2 3.4*  CL 103 102 101  CO2 _0 BUN _1 CREATININE 0.84 0.90 0.94  GLUCOSE 103* 107* 121*     Recent Labs Lab 07/11/17 0402 07/12/17 0519 07/14/17 0526  HGB 10.6* 11.2* 10.9*  HCT 33.9* 35.6* 35.3*  WBC 9.2 7.6 8.6  PLT 408* 399 379    Dg Chest Port 1 View  Result Date: 07/15/2017 CLINICAL DATA:  Respiratory failure. EXAM: PORTABLE CHEST 1 VIEW COMPARISON:  Two days ago FINDINGS: Unchanged bilateral airspace disease compared to chest x-ray 2 days ago. No visible cavitation. Trace pleural fluid. No pneumothorax. Stable heart size and mediastinal contours. IMPRESSION: Chronic airspace opacity with peripheral predilection on CT 3 days ago, reference differential at that time. Would  specifically consider eosinophilic pneumonia (eosinophilia on CBC). Electronically Signed   By: Monte Fantasia M.D.   On: 07/15/2017 07:27   CULTURES BCx2 8/14 >>  Sputum 8/14 >>NORF U. Strep 8/14 >> negative U. Legionella 8/14 >> neg   ANTIBIOTICS Vanco 8/14 >>  Zosyn 8/14 >>  Azithro 8/14 >>   SIGNIFICANT EVENTS  6/26  Seen by PCP, reportedly rx'd levaquin  7/16  Seen in Pulmonary  7/18  Seen in Pulmonary  8/06  FOB  8/14  Admit for evaluation of  infiltrates  STUDIES:  CTA Chest 05/22/17 >> neg for PE (motion limited), widespread multifocal airspace consolidation throughout bilateral lungs compatible with severe multilobar bronchopneumonia, with evidence of early cavitation in some regions, trace right pleural effusion, probable reactive mediastinal & hilar lymphadenopathy, severe hepatic steatosis, aortic atherosclerosis, in addition to left main and 2 vessel CAD  CT Chest 8/14 > Progressive consolidation in the right lower lobe since prior CT. Development of multifocal, primarily peripheral consolidations and pulmonary nodules. Mild mediastinal adenopathy, likely reactive. SLP 8/14 >> nml  UDS 8/14 >> negative   ASSESSMENT / PLAN:  Discussion:  56 y/o M with prior hx of PNA / pneumatocele with repeat episode of of PNA that began in June of 2018.  He was treated with levaquin as an outpatient with additional work up as above.  He underwent an FOB 8/6 with negative cultures and cytology.  Despite multiple courses of outpatient antibiotics, he has had an interval worsening of airspace disease.    Right Community Acquired PNA, failed outpatient tx Bilateral Infiltrates, R>L. ESR 97>90 Swallow eval nml 8/16.  Plan: Continue current IV abx CXR on 8/17 unchanged  -  favor either bx or empiric steroids. Will discuss w Dr Elsworth Soho Follow micro Consider empiric steroids . Check IgE /CBC w/ diff in am .      OSA on CPAP  Plan: CPAP per home settings.   GERD  Plan: Home PPI  HTN HLD Plan: Continue current regimen  Hypokalemia  Replace    DVT Prophylaxis - heparin, SCD's GI - protonix  Diet - regular diet as tolerated  Family Update - patient updated at bedside 8/19   Tammy Parrett NP-C  [facility]  [phone]  07/15/2017, 12:45 PM   STAFF NOTE: I, Merrie Roof, MD FACP have personally reviewed patient's available data, including medical history, events of note, physical examination and test  results as part of my evaluation. I have discussed with resident/NP and other care providers such as pharmacist, RN and RRT. In addition, I personally evaluated patient and elicited key findings of: coarse slight bs, no distress, no rash, abdo soft, no distress, pcxr I reviewed does NOT show progress peripheral changes exist, he has not improved with abx, esr elevated, Boop and eos pNA on diff, will add empiric steroids IV and assess progress, no mrsa, dc vanc, continue to follow pcxr and clinical progress, hypokalemia, supp k, have more confidence in use steroids with culture neg BAl on bronch, if responsive to steroids may be able to avoid Open lung bx, add ssi  Lavon Paganini. Titus Mould, MD, [place] Pgr: [facility] 07/15/2017 2:20 PM

## 2017-07-16 ENCOUNTER — Telehealth: Payer: Self-pay | Admitting: *Deleted

## 2017-07-16 LAB — CBC WITH DIFFERENTIAL/PLATELET
BASOS PCT: 0 %
Basophils Absolute: 0 10*3/uL (ref 0.0–0.1)
EOS ABS: 0 10*3/uL (ref 0.0–0.7)
EOS PCT: 0 %
HCT: 37.5 % — ABNORMAL LOW (ref 39.0–52.0)
HEMOGLOBIN: 11.6 g/dL — AB (ref 13.0–17.0)
Lymphocytes Relative: 9 %
Lymphs Abs: 1 10*3/uL (ref 0.7–4.0)
MCH: 22.6 pg — AB (ref 26.0–34.0)
MCHC: 30.9 g/dL (ref 30.0–36.0)
MCV: 73.1 fL — AB (ref 78.0–100.0)
MONO ABS: 0.3 10*3/uL (ref 0.1–1.0)
Monocytes Relative: 3 %
NEUTROS ABS: 10.2 10*3/uL — AB (ref 1.7–7.7)
Neutrophils Relative %: 88 %
PLATELETS: 400 10*3/uL (ref 150–400)
RBC: 5.13 MIL/uL (ref 4.22–5.81)
RDW: 15.8 % — ABNORMAL HIGH (ref 11.5–15.5)
WBC: 11.5 10*3/uL — ABNORMAL HIGH (ref 4.0–10.5)

## 2017-07-16 LAB — BASIC METABOLIC PANEL
Anion gap: 8 (ref 5–15)
BUN: 9 mg/dL (ref 6–20)
CALCIUM: 9.3 mg/dL (ref 8.9–10.3)
CHLORIDE: 102 mmol/L (ref 101–111)
CO2: 24 mmol/L (ref 22–32)
CREATININE: 0.92 mg/dL (ref 0.61–1.24)
GFR calc Af Amer: >60 mL/min (ref >60)
GFR calc non Af Amer: >60 mL/min (ref >60)
GLUCOSE: 174 mg/dL — AB (ref 65–99)
Potassium: 4.6 mmol/L (ref 3.5–5.1)
Sodium: 134 mmol/L — ABNORMAL LOW (ref 135–145)

## 2017-07-16 LAB — GLUCOSE, CAPILLARY
GLUCOSE-CAPILLARY: 127 mg/dL — AB (ref 65–99)
GLUCOSE-CAPILLARY: 163 mg/dL — AB (ref 65–99)
Glucose-Capillary: 145 mg/dL — ABNORMAL HIGH (ref 65–99)
Glucose-Capillary: 159 mg/dL — ABNORMAL HIGH (ref 65–99)

## 2017-07-16 NOTE — Telephone Encounter (Signed)
Spoke with pt. Made him aware that RA has completed these forms. Cherina gave them to [facility] and she sent them back to [employer]. Pt has been given medical records phone number to follow up with them about these forms. Nothing further was needed.

## 2017-07-16 NOTE — Telephone Encounter (Addendum)
Pt calling back a/b fmla form status please advise, he can be reached @ [phone] pt says this is an emergency situation now, says the deadline is vastly approaching and possibly missed.Christian Hubbard

## 2017-07-16 NOTE — Progress Notes (Signed)
Name: Christian Hubbard MRN: [id number] DOB: 08/03/1961    ADMISSION DATE:  07/10/2017 CONSULTATION DATE:  07/10/17  REFERRING MD :  Dr. Elsworth Soho   CHIEF COMPLAINT:  Bilateral Infiltrates   HISTORY OF PRESENT ILLNESS:  56 y/o M, former smoker, with a PMH of solitary pulmonary nodule, OSA on CPAP, prior PNA and pneumatocele.   Initially developed PNA in 2014, which resolved with pneumatocele formation. In late June (6/26) he saw his PCP with concerns for PNA with increased fatigue, shortness of breath and right sided chest pain.  He also had lost ~ 30 lbs.  He was treated with Levaquin.  Given symptoms a CTA of the chest was assessed which was negative for PE but showed widespread multifocal airspace consolidation compatible with severe multilobar PNA with evidence of early cavitation in some regions . He followed up with his PCP 7/13 and CXR showed a worsening of opacity in the right mid lung.  He thinks he completed approximately 4 weeks of Levaquin.  He followed up in the Pulmonary office on 7/16 with ongoing SOB, fatigue.  Quantiferon gold assessed and negative, ANCA negative, WBC 9.8, Hgb 13.3, Sed rate 97.  He returned 7/18 for follow up and reported no resolution of symptoms.  Urine strep antigen was ordered but not completed. Given slow resolution a bronchoscopy was completed 8/6 which showed a normal upper airway, normal vocal cords, no significant secretions, no endobronchial lesions.  Transbronchial biopsies x2, brushings and BAL were obtained > cytology, cultures, AFB/Fungal (prelim) negative (final fungal / AFB pending).  A repeat CXR was completed on 8/13 which showed an interval worsening of right sided opacities with additional opacities on the left.    The patient was instructed to come to the hospital for direct admission.    SUBJECTIVE:  Feels ok Wants to go home  VITAL SIGNS: Temp:  [98.2 F (36.8 C)-98.5 F (36.9 C)] 98.4 F (36.9 C) (08/20 0755) Pulse Rate:  [68-93] 72  (08/20 0755) Resp:  [17-20] 20 (08/20 0755) BP: (111-130)/(72-86) 122/75 (08/20 0755) SpO2:  [96 %-98 %] 98 % (08/20 0755) Weight:  [271 lb 7.6 oz (123.1 kg)] 271 lb 7.6 oz (123.1 kg) (08/19 2102) Room air   PHYSICAL EXAMINATION: General appearance:  56 Year old  Male, well nourished NAD,conversant  Eyes: anicteric sclerae, moist conjunctivae; PERRL, EOMI bilaterally. Mouth:  membranes and no mucosal ulcerations; normal hard and soft palate Neck: Trachea midline; neck supple, no JVD Lungs/chest: CTA, with normal respiratory effort and no intercostal retractions CV: RRR, no MRGs  Abdomen: Soft, non-tender; no masses or HSM Extremities: No peripheral edema or extremity lymphadenopathy Skin: Normal temperature, turgor and texture; no rash, ulcers or subcutaneous nodules Psych: Appropriate affect, alert and oriented to person, place and time     Recent Labs Lab 07/14/17 0526 07/15/17 0237 07/16/17 0224  NA 137 136 134*  K 4.2 3.4* 4.6  CL 102 101 102  CO2 _0 BUN _1 CREATININE 0.90 0.94 0.92  GLUCOSE 107* 121* 174*     Recent Labs Lab 07/12/17 0519 07/14/17 0526 07/16/17 0433  HGB 11.2* 10.9* 11.6*  HCT 35.6* 35.3* 37.5*  WBC 7.6 8.6 11.5*  PLT 399 379 400    Dg Chest Port 1 View  Result Date: 07/15/2017 CLINICAL DATA:  Respiratory failure. EXAM: PORTABLE CHEST 1 VIEW COMPARISON:  Two days ago FINDINGS: Unchanged bilateral airspace disease compared to chest x-ray 2 days ago. No visible cavitation. Trace pleural  fluid. No pneumothorax. Stable heart size and mediastinal contours. IMPRESSION: Chronic airspace opacity with peripheral predilection on CT 3 days ago, reference differential at that time. Would specifically consider eosinophilic pneumonia (eosinophilia on CBC). Electronically Signed   By: Monte Fantasia M.D.   On: 07/15/2017 07:27   CULTURES BCx2 8/14 >>  Sputum 8/14 >>NORF U. Strep 8/14 >> negative U. Legionella 8/14 >> neg    ANTIBIOTICS Vanco 8/14 >> 8/19 Zosyn 8/14 >> 8/20 Azithro 8/14 >> 8/20  SIGNIFICANT EVENTS  6/26  Seen by PCP, reportedly rx'd levaquin  7/16  Seen in Pulmonary  7/18  Seen in Pulmonary  8/06  FOB  8/14  Admit for evaluation of infiltrates  STUDIES:  CTA Chest 05/22/17 >> neg for PE (motion limited), widespread multifocal airspace consolidation throughout bilateral lungs compatible with severe multilobar bronchopneumonia, with evidence of early cavitation in some regions, trace right pleural effusion, probable reactive mediastinal & hilar lymphadenopathy, severe hepatic steatosis, aortic atherosclerosis, in addition to left main and 2 vessel CAD  CT Chest 8/14 > Progressive consolidation in the right lower lobe since prior CT. Development of multifocal, primarily peripheral consolidations and pulmonary nodules. Mild mediastinal adenopathy, likely reactive. SLP 8/14 >> nml  UDS 8/14 >> negative   ASSESSMENT / PLAN:  Discussion:  56 y/o M with prior hx of PNA / pneumatocele with repeat episode of of PNA that began in June of 2018.  He was treated with levaquin as an outpatient with additional work up as above.  He underwent an FOB 8/6 with negative cultures and cytology.  Despite multiple courses of outpatient antibiotics, he has had an interval worsening of airspace disease.   Steroid trial started 8/19  Bilateral Infiltrates, R>L. ESR 97>90 Swallow eval nml 8/16.  Feels better.  Plan: Cont solumedrol at current dose Walking oximetry If no issues home in am w/ slow steroid taper    OSA on CPAP  Plan: CPAP at hs   GERD  Plan: Cont ppi   HTN HLD Plan: Cont current rx    DVT Prophylaxis - heparin, SCD's GI - protonix  Diet - regular diet as tolerated  Family Update - patient updated at bedside 8/19  Erick Colace ACNP-BC [place] Pager # [phone] OR # [phone] if no answer   Attending Note:  I have examined patient, reviewed labs,  studies and notes. I have discussed the case with Jerrye Bushy, and I agree with the data and plans as amended above.    Baltazar Apo, MD, PhD 07/17/2017, 2:46 PM [facility] [phone] or if no answer [phone]

## 2017-07-16 NOTE — Telephone Encounter (Signed)
-----   Message from Melvenia Needles, NP sent at 07/15/2017  1:02 PM EDT ----- Can you look at HR papers for FMLA  Need to call [place] , [phone]. Info missing on papers I can complete for Dr. Elsworth Soho  .   Tam

## 2017-07-16 NOTE — Telephone Encounter (Signed)
Rec'd completed FMLA paperwork from [employer] to [employer] via interoffice mail - pr

## 2017-07-16 NOTE — Telephone Encounter (Signed)
Spoke with pt. Made him aware that RA has completed these forms. Cherina gave them to [facility] and she sent them back to [employer]. Pt has been given medical records phone number to follow up with them about these forms. Nothing further was needed.

## 2017-07-16 NOTE — Telephone Encounter (Signed)
Fax received and given to TP Areas corrected and faxed back to Mickel Baas @ [phone]  FMLA papers held at my desk to ensure nothing further is needed Will sign off on message and keep in by box

## 2017-07-16 NOTE — Telephone Encounter (Signed)
Called spoke with Mickel Baas who reported one question/answer on the FMLA forms will not be accepted - regarding probable duration of condition.  Since patient is still currently admitted, Mickel Baas states that the form needs to include the start date and how long pt is expected to be effected by this condition.  Forms to be faxed to my attention @ [phone] Will await forms to give to TP

## 2017-07-16 NOTE — Progress Notes (Signed)
SATURATION QUALIFICATIONS: (This note is used to comply with regulatory documentation for home oxygen)  Patient Saturations on Room Air at Rest = 98%  Patient Saturations on Room Air while Ambulating = 96%  Patient Saturations on 0 Liters of oxygen while Ambulating = 96%  Please briefly explain why patient needs home oxygen: Pt does not qualify for home oxygen. Leanne Chang, RN

## 2017-07-16 NOTE — Progress Notes (Signed)
Pt has home cpap and self administers. Advised to call if needed anything..  

## 2017-07-16 NOTE — Telephone Encounter (Signed)
RA is on vacation this entire week. I attempted to contact the pt. His line just rang with no answer and no voicemail. Will try back.

## 2017-07-16 NOTE — Discharge Summary (Signed)
Physician Discharge Summary       Patient ID: Christian Hubbard MRN: [id number] DOB/AGE: Mar 06, 1961 56 y.o.  Admit date: 07/10/2017 Discharge date: 07/17/2017  Discharge Diagnoses:   Pulmonary Infiltrates of unclear etiology  Obstructive sleep apnea  GERD HTN HLD  Detailed Hospital Course:  This is a 56 year old male w/ significant h/o pneumonia back in 2014 which resolved w/ pneumatocele formation. In Late June 2018 went to his PCP w/ concern for possible PNA. This was associated w/ fatigue, shortness of breath and right sided chest pain. He also noted 30 lb weight loss. He was initially treated w/ Levaquin. A CT of chest was obtained to rule out Pulmonary Emboli. This was negative BUT did show wide spread multi-focal airspace disease raising concern for multifocal pneumonia and also raising concern for early cavitation in some spots. He completed about 4 weeks of levaquin w/ on-going shortness of breath and radiographic evidence of worsening of disease.  -His quantiferon gold was negative -ANCA negative - underwent FOB on 8/6. He had transbronchial biopsies obtained as well as brushings and BAL -cytology, cultures, AFB and fungal were negative.  -repeat imaging on 8/13 showed worsening airspace disease so he was advised to be directly admitted.  Hospital course:  Admitted 8/14, cultures sent, broad spec abx started (vanc, azith and zosyn) CT Chest 8/14 > Progressive consolidation in the right lower lobe since prior CT. Development of multifocal, primarily peripheral consolidations and pulmonary nodules. Mild mediastinal adenopathy, likely reactive. SLP 8/14 >> nml  UDS 8/14 >> negative 8/19 no sig change after several days of antibiotics either clinically or radiographically so decision was made to attempt steroid trial. Solumedrol 40 mg IV q 12 started 8/20 antibiotics stopped. All diagnostics negative to date.  8/21 -  walking oximetry was checked this showed: no desaturation  event - CXR showed: some interval improvement after 48 hours of systemic steroids.  - deemed ready for dc with the plan as outlined below  - igE: remains pending - ready for dc w/ the plan as directed below   Discharge Plan by active problems   Acute Pulmonary infiltrates of unknown Etiology; appears to be steroid responsive.  Plan Dc to home on prednisone 40 mg/d; will hold this at this dose for 3-4 weeks.  Will f/u in our office on: Sept 11 at 1115 for check in then chest -xray then Tammy Parrett at 1130.  Still may end up needing lung biopsy if this re-occurs OR if gets worse as we reduce steroid dosing.  Significant Hospital tests/ studies  Consults: none   Discharge Exam: BP 129/82 (BP Location: Left Arm)   Pulse 70   Temp 98.4 F (36.9 C) (Oral)   Resp 19   Ht _0  (1.88 m)   Wt 273 lb 5.9 oz (124 kg)   SpO2 100%   BMI 35.10 kg/m  General appearance:  56 Year old  Male  well nourished  NAD,  conversant  Eyes: anicteric sclerae , moist conjunctivae; PERRL, EOMI bilaterally. Mouth:  membranes and no mucosal ulcerations; normal hard and soft palate Neck: Trachea midline; neck supple, no JVD Lungs/chest: fine crackles in bases, with normal respiratory effort and no intercostal retractions CV: RRR, no MRGs  Abdomen: Soft, non-tender; no masses or HSM Extremities: No peripheral edema or extremity lymphadenopathy Skin: Normal temperature, turgor and texture; no rash, ulcers or subcutaneous nodules Psych: Appropriate affect, alert and oriented to person, place and time   Labs at discharge Lab Results  Component Value Date   CREATININE 0.84 07/17/2017   BUN 12 07/17/2017   NA 138 07/17/2017   K 4.6 07/17/2017   CL 103 07/17/2017   CO2 26 07/17/2017   Lab Results  Component Value Date   WBC 11.5 (H) 07/16/2017   HGB 11.6 (L) 07/16/2017   HCT 37.5 (L) 07/16/2017   MCV 73.1 (L) 07/16/2017   PLT 400 07/16/2017   Lab Results  Component Value Date   ALT 15 (L)  07/10/2017   AST 17 07/10/2017   ALKPHOS 82 07/10/2017   BILITOT 0.6 07/10/2017   Lab Results  Component Value Date   INR 1.26 08/10/2014    Current radiology studies Dg Chest 2 View  Result Date: 07/17/2017 CLINICAL DATA:  Pneumonitis. EXAM: CHEST  2 VIEW COMPARISON:  07/14/2017, 07/13/2017, 07/09/2017.  CT 07/10/2017. FINDINGS: Mediastinum and hilar structures are normal. Heart size stable. Multiple bilateral pulmonary nodular densities/infiltrates are again noted. No interim change. No prominent pleural effusion or pneumothorax . IMPRESSION: Multiple bilateral pulmonary nodular densities/ infiltrates are again noted without significant change. Reference is made to prior CT report of 07/10/2017. Electronically Signed   By: Marcello Moores  Register   On: 07/17/2017 08:24    Disposition:  01-Home or Self Care  Discharge Instructions    Diet - low sodium heart healthy    Complete by:  As directed    Increase activity slowly    Complete by:  As directed      Allergies as of 07/17/2017   No Known Allergies     Medication List    STOP taking these medications   naproxen 500 MG tablet Commonly known as:  NAPROSYN     TAKE these medications   amLODipine 5 MG tablet Commonly known as:  NORVASC Take 5 mg by mouth daily.   losartan-hydrochlorothiazide 100-12.5 MG tablet Commonly known as:  HYZAAR Take 1 tablet by mouth daily.   omeprazole 40 MG capsule Commonly known as:  PRILOSEC Take 40 mg by mouth daily.   pravastatin 40 MG tablet Commonly known as:  PRAVACHOL Take 40 mg by mouth daily.   predniSONE 20 MG tablet Commonly known as:  DELTASONE Take 2 tabs daily x 3 weeks, then decrease to 1.5 tab x 3 weeks, then continue as instructed.   sildenafil 100 MG tablet Commonly known as:  VIAGRA Take 100 mg by mouth daily as needed for erectile dysfunction.      Follow-up Information    Parrett, Fonnie Mu, NP Follow up on 08/09/2017.   Specialty:  Pulmonary Disease Why:  At  215 for check in then chest x ray then with Tammy Parrett at 230pm Contact information: [address] [address] [phone]           Discharged Condition: good  Physician Statement:   The Patient was personally examined, the discharge assessment and plan has been personally reviewed and I agree with ACNP Jen Eppinger's assessment and plan. 34  minutes of time have been dedicated to discharge assessment, planning and discharge instructions.   Signed: Clementeen Graham 07/17/2017, 12:29 PM  Erick Colace ACNP-BC [place] Pager # [phone] OR # [phone] if no answer

## 2017-07-17 ENCOUNTER — Inpatient Hospital Stay (HOSPITAL_COMMUNITY): Payer: 59

## 2017-07-17 ENCOUNTER — Telehealth: Payer: Self-pay | Admitting: Adult Health

## 2017-07-17 DIAGNOSIS — R918 Other nonspecific abnormal finding of lung field: Secondary | ICD-10-CM

## 2017-07-17 LAB — BASIC METABOLIC PANEL
Anion gap: 9 (ref 5–15)
BUN: 12 mg/dL (ref 6–20)
CALCIUM: 9.7 mg/dL (ref 8.9–10.3)
CO2: 26 mmol/L (ref 22–32)
CREATININE: 0.84 mg/dL (ref 0.61–1.24)
Chloride: 103 mmol/L (ref 101–111)
GFR calc Af Amer: >60 mL/min (ref >60)
GLUCOSE: 135 mg/dL — AB (ref 65–99)
Potassium: 4.6 mmol/L (ref 3.5–5.1)
SODIUM: 138 mmol/L (ref 135–145)

## 2017-07-17 LAB — GLUCOSE, CAPILLARY
GLUCOSE-CAPILLARY: 121 mg/dL — AB (ref 65–99)
Glucose-Capillary: 159 mg/dL — ABNORMAL HIGH (ref 65–99)

## 2017-07-17 MED ORDER — PREDNISONE 20 MG PO TABS: ORAL_TABLET | ORAL | 2 refills | Status: DC

## 2017-07-17 NOTE — Telephone Encounter (Signed)
Spoke with Laurey Arrow, cxr ordered to be done at 9/11 OV with TP.  Note added to appt note to get cxr prior per [facility].  Nothing further needed at this time.

## 2017-07-17 NOTE — Progress Notes (Signed)
Discharge instructions reviewed with pt  Verb understanding.

## 2017-07-18 LAB — IGE: IGE (IMMUNOGLOBULIN E), SERUM: 125 [IU]/mL — AB (ref 0–100)

## 2017-07-19 ENCOUNTER — Telehealth: Payer: Self-pay | Admitting: Pulmonary Disease

## 2017-07-19 NOTE — Telephone Encounter (Signed)
Pt returned phone, pt contact # [phone]...ert

## 2017-07-19 NOTE — Telephone Encounter (Signed)
Spoke with patient, he is aware that Dr. Annamaria Boots has approved this letter. Will go ahead and type letter for him. He has requested two copies. Advised patient that these copies will be placed up front for him to pick up in the morning. Patient is aware. Nothing else needed at time of call.

## 2017-07-19 NOTE — Telephone Encounter (Signed)
If he feels well enough to return to full time work, then ok to write him this letter.

## 2017-07-19 NOTE — Telephone Encounter (Signed)
Spoke with patient. He stated that he needs a letter to return back to on Monday, full duty. He stated that he asked for a letter when he was discharged from the hospital, but he never received one.   CY, since TP and RA are both out on vacation, would you be willing to give [place] permission to write this letter? Please advise. Thanks.

## 2017-07-19 NOTE — Telephone Encounter (Signed)
Left message for patient to call back  

## 2017-07-20 ENCOUNTER — Telehealth: Payer: Self-pay | Admitting: Pulmonary Disease

## 2017-07-20 NOTE — Telephone Encounter (Signed)
Will send to Christian Hubbard to keep an eye out, she has not received anything as of yet.

## 2017-07-23 NOTE — Telephone Encounter (Signed)
Patient request to speak with someone regarding forms for work. [phone].

## 2017-07-23 NOTE — Telephone Encounter (Signed)
Patient called checking on form that was to be faxed today - he can be reached at [phone] -pr

## 2017-07-23 NOTE — Telephone Encounter (Signed)
I still do not have anything on this patient.

## 2017-07-23 NOTE — Telephone Encounter (Signed)
Pt states that he is going to call his [employer] again to have them re-fax these forms today. Pt was given our main fax # and was advised to have them put to the attention of [place].  Will send to [place] to keep an eye out

## 2017-07-25 NOTE — Telephone Encounter (Signed)
Patient in the lobby with paperwork that needs to be completed -pr

## 2017-07-25 NOTE — Telephone Encounter (Signed)
Pt dropped of paperwork to be completed for his employer. Pt is requesting this be completed ASAP. I have spoken with RA verbally- schedule pt for OV next week for f/u Pt has been scheduled for 08/03/17 with RA. Nothing further needed.

## 2017-07-25 NOTE — Telephone Encounter (Signed)
lmtcb x1 for pt. 

## 2017-08-03 ENCOUNTER — Ambulatory Visit (INDEPENDENT_AMBULATORY_CARE_PROVIDER_SITE_OTHER)
Admission: RE | Admit: 2017-08-03 | Discharge: 2017-08-03 | Disposition: A | Discharge status: Home / Self Care | Payer: 59 | Source: Ambulatory Visit | Attending: Pulmonary Disease | Admitting: Pulmonary Disease

## 2017-08-03 ENCOUNTER — Encounter: Payer: Self-pay | Admitting: Pulmonary Disease

## 2017-08-03 ENCOUNTER — Ambulatory Visit (INDEPENDENT_AMBULATORY_CARE_PROVIDER_SITE_OTHER): Payer: 59 | Admitting: Pulmonary Disease

## 2017-08-03 ENCOUNTER — Telehealth: Payer: Self-pay | Admitting: Pulmonary Disease

## 2017-08-03 VITALS — BP 138/86 | HR 97 | Ht 74.0 in | Wt 273.0 lb

## 2017-08-03 DIAGNOSIS — R918 Other nonspecific abnormal finding of lung field: Secondary | ICD-10-CM | POA: Diagnosis not present

## 2017-08-03 DIAGNOSIS — J189 Pneumonia, unspecified organism: Secondary | ICD-10-CM | POA: Diagnosis not present

## 2017-08-03 NOTE — Assessment & Plan Note (Addendum)
Chest x-ray appears improved. We discussed possibility of mycobacterial infection He seems to improve either with antibiotics or steroids or time  Okay to return to work-no restrictions  Decrease prednisone to 30 mg starting today On September 14, decreased to 20 mg On 9/21 decrease to 10 mg daily instead this dose until next visit in 4 weeks

## 2017-08-03 NOTE — Patient Instructions (Signed)
Chest x-ray appears improved. We discussed possibility of mycobacterial infection  Okay to return to work-no restrictions  Decrease prednisone to 30 mg starting today On September 14, decreased to 20 mg On 9/21 decrease to 10 mg daily instead this dose until next visit in 4 weeks

## 2017-08-03 NOTE — Telephone Encounter (Signed)
Follow ups can be seen by NP's.. Pt scheduled to see TP in 4 weeks as requested by RA.  Nothing further needed.

## 2017-08-03 NOTE — Progress Notes (Signed)
   Subjective:    Patient ID: Christian Hubbard, male    DOB: [birth year], 56 y.o.   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 CT chest showed multifocal bilateral consolidation, with pneumatocele noted in the past  He continued to do poorly with increasing infiltrates on chest x-ray, hence was hospitalized 8/14-8/21 for IV antibiotics CT chest with/14 showed progressive consolidation in the right lower lobe with multifocal consolidations and nodules Urine drug screen was negative, ANCA negative He was discharged on 40 mg of prednisone with plan to taper to 30 mg in 21 days  Bronchoscopy from 8/6 was negative for bacterial cultures and fungal but is now growing Mycobacterium avium   He feels subjectively improved relating to his shortness of breath, cough has gone away. He denies night sweats or fevers  Chest x-ray appears much improved today with some residual scarring   SIGNIFICANT EVENTS / STUDIES:  CT 2014 - RLL pna + AF level , large 10cm .  CT chest done 12/09/15 with stable 3 mm RUL nodule and RLL pneumatocele.  6/26 CTA Chest >>neg for PE, widespread multifocal airspace consolidation compatible with severe multilobar PNA with evidence of early cavitation in some regions, trace right pleural effusion, severe hepatic steatosis, 2 vessel CAD  Past Medical History:  Diagnosis Date  . GERD (gastroesophageal reflux disease)   . Hyperlipemia   . Hypertension   . OSA on CPAP   . Pneumonia 08/10/2014; 07/10/2017     Review of Systems neg for any significant sore throat, dysphagia, itching, sneezing, nasal congestion or excess/  purulent secretions, fever, chills, sweats, unintended wt loss, pleuritic or exertional cp, hempoptysis, orthopnea pnd or change in chronic leg swelling. Also denies presyncope, palpitations, heartburn, abdominal pain, nausea, vomiting, diarrhea or change in bowel or urinary habits, dysuria,hematuria, rash, arthralgias, visual complaints, headache, numbness weakness or ataxia.     Objective:   Physical Exam  Gen. Pleasant, obese, in no distress ENT - no lesions, no post nasal drip Neck: No JVD, no thyromegaly, no carotid bruits Lungs: no use of accessory muscles, no dullness to percussion, decreased without rales or rhonchi  Cardiovascular: Rhythm regular, heart sounds  normal, no murmurs or gallops, no peripheral edema Musculoskeletal: No deformities, no cyanosis or clubbing , no tremors       Assessment & Plan:

## 2017-08-07 ENCOUNTER — Inpatient Hospital Stay: Payer: 59 | Admitting: Adult Health

## 2017-08-07 LAB — FUNGUS CULTURE WITH STAIN

## 2017-08-07 LAB — FUNGAL ORGANISM REFLEX

## 2017-08-07 LAB — FUNGUS CULTURE RESULT

## 2017-08-08 ENCOUNTER — Telehealth: Payer: Self-pay | Admitting: Pulmonary Disease

## 2017-08-08 LAB — MAC SUSCEPTIBILITY BROTH
Ciprofloxacin: >16
Clarithromycin: 2
Ethambutol: 8
Rifampin: 8

## 2017-08-08 LAB — AFB ORGANISM ID BY DNA PROBE
M AVIUM COMPLEX: POSITIVE — AB
M TUBERCULOSIS COMPLEX: NEGATIVE

## 2017-08-08 LAB — ACID FAST CULTURE WITH REFLEXED SENSITIVITIES (MYCOBACTERIA): Acid Fast Culture: POSITIVE — AB

## 2017-08-08 MED ORDER — PREDNISONE 10 MG PO TABS: ORAL_TABLET | ORAL | 0 refills | Status: DC

## 2017-08-08 NOTE — Telephone Encounter (Signed)
Spoke with patient. He stated that he was supposed to receive a prednisone RX after his last visit. Looked at his last OV note, RA did want him to have prednisone. Apologized for it not being sent in. He verbalized understanding. Nothing else needed at time of call.

## 2017-08-09 ENCOUNTER — Inpatient Hospital Stay: Payer: 59 | Admitting: Adult Health

## 2017-08-22 ENCOUNTER — Telehealth: Payer: Self-pay | Admitting: Pulmonary Disease

## 2017-08-22 NOTE — Telephone Encounter (Signed)
Left detailed message for Christian Hubbard stating that the patient was never diagnosed with TB.

## 2017-08-23 LAB — AFB CULTURE WITH SMEAR (NOT AT ARMC)

## 2017-08-28 ENCOUNTER — Ambulatory Visit (INDEPENDENT_AMBULATORY_CARE_PROVIDER_SITE_OTHER)
Admission: RE | Admit: 2017-08-28 | Discharge: 2017-08-28 | Disposition: A | Discharge status: Home / Self Care | Payer: 59 | Source: Ambulatory Visit | Attending: Adult Health | Admitting: Adult Health

## 2017-08-28 ENCOUNTER — Encounter: Payer: Self-pay | Admitting: Adult Health

## 2017-08-28 ENCOUNTER — Ambulatory Visit (INDEPENDENT_AMBULATORY_CARE_PROVIDER_SITE_OTHER): Payer: 59 | Admitting: Adult Health

## 2017-08-28 VITALS — BP 106/74 | HR 72 | Ht 74.0 in | Wt 284.8 lb

## 2017-08-28 DIAGNOSIS — J189 Pneumonia, unspecified organism: Secondary | ICD-10-CM | POA: Diagnosis not present

## 2017-08-28 NOTE — Assessment & Plan Note (Signed)
Multifocal PNA -refractory to abx. ESR and Eosinophils elevated. IgE 125.  Fob neg for malignancy . BAL +MAI . Cx neg otherwise.  Pt placed on steroid challenge with interval improvement and resolving opacites.  Will taper prednisone slowly over next 4-5 weeks and stop .  Have him follow up in 6 weeks with chest xray  Will need to discus if referral to ID is indicated for previous MAI+ BAL .

## 2017-08-28 NOTE — Progress Notes (Signed)
_0  ID: [name], male    DOB: 1961/08/02, 56 y.o.   MRN: [id number]  Chief Complaint  Patient presents with  . 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  CT chest done 12/09/15 with stable 3 mm RUL nodule and RLL pneumatocele.  6/26 CTA Chest >>neg for PE, widespread multifocal airspace consolidation compatible with severe multilobar PNA with evidence of early cavitation in some regions, trace right pleural effusion, severe hepatic steatosis, 2 vessel CAD  07/16/17 IgE 125.    08/28/2017 Follow up ; PNA  Pt returns for follow up for pneumonia . He was dx with multifocal PNA in 04/2017 . CT chest   CT chest showed multifocal bilateral consolidation, with pneumatocele noted in the past. He continued to decline and was admitted in August , CT chest 8/14 showed progressive consolidation in RLL and multifocal consolidations/nodules. ANCA neg. UDS neg. FOB on 8/6 neg bacterial cultures . AFB .+MAI .DNA probe on 8/6.  AFB smear neg. Path neg for malignancy /inflamation .AFB culture ne 07/09/17 . ESR was elevated at 90. He was tx w/ IV abx . He was started on prolonged steroid course. CXR showed interval improvement after steroids started.  IgE was 125. Eosinophils were elevated.  He returns today feeling okay . He denies cough or sob.  CXR today shows residual scarring mid right lung.  He is currently on Prednisone 41m daily .    No Known Allergies   There is no immunization history on file for this patient.  Past Medical History:  Diagnosis Date  . GERD (gastroesophageal reflux disease)   . Hyperlipemia   .  Hypertension   . OSA on CPAP   . Pneumonia 08/10/2014; 07/10/2017    Tobacco History: History  Smoking Status  . Former Smoker  . Packs/day: 1.00  . Years: 24.00  . Types: Cigarettes  . Quit date: 11/27/2004  Smokeless Tobacco  . Never Used   Counseling given: Not Answered   Outpatient Encounter Prescriptions as of 08/28/2017  Medication Sig  . amLODipine (NORVASC) 5 MG tablet Take 5 mg by mouth daily.   .Marland Kitchenlosartan-hydrochlorothiazide (HYZAAR) 100-12.5 MG tablet Take 1 tablet by mouth daily.  .Marland Kitchenomeprazole (PRILOSEC) 40 MG capsule Take 40 mg by mouth daily.  . pravastatin (PRAVACHOL) 40 MG tablet Take 40 mg by mouth daily.  . predniSONE (DELTASONE) 10 MG tablet Take 372mdaily x7 days, then 2030m7days, and then hold at 87m35m. predniSONE (DELTASONE) 20 MG tablet Take 2 tabs daily x 3 weeks, then decrease to 1.5 tab x 3 weeks, then continue as instructed.  . sildenafil (VIAGRA) 100 MG tablet Take 100 mg by mouth daily as needed for erectile dysfunction.   No facility-administered encounter medications on file as of 08/28/2017.      Review of Systems  Constitutional:   No  weight loss, night sweats,  Fevers, chills, fatigue, or  lassitude.  HEENT:   No headaches,  Difficulty swallowing,  Tooth/dental problems, or  Sore throat,  No sneezing, itching, ear ache, nasal congestion, post nasal drip,   CV:  No chest pain,  Orthopnea, PND, swelling in lower extremities, anasarca, dizziness, palpitations, syncope.   GI  No heartburn, indigestion, abdominal pain, nausea, vomiting, diarrhea, change in bowel habits, loss of appetite, bloody stools.   Resp: No shortness of breath with exertion or at rest.  No excess mucus, no productive cough,  No non-productive cough,  No coughing up of blood.  No change in color of mucus.  No wheezing.  No chest wall deformity  Skin: no rash or lesions.  GU: no dysuria, change in color of urine, no urgency or frequency.  No flank pain, no  hematuria   MS:  No joint pain or swelling.  No decreased range of motion.  No back pain.    Physical Exam  BP 106/74 (BP Location: Left Arm, Cuff Size: Normal)   Pulse 72   Ht _0  (1.88 m)   Wt 284 lb 12.8 oz (129.2 kg)   SpO2 99%   BMI 36.57 kg/m   GEN: A/Ox3; pleasant , NAD, obese    HEENT:  [place]/AT,  EACs-clear, TMs-wnl, NOSE-clear, THROAT-clear, no lesions, no postnasal drip or exudate noted.   NECK:  Supple w/ fair ROM; no JVD; normal carotid impulses w/o bruits; no thyromegaly or nodules palpated; no lymphadenopathy.    RESP  Clear  P & A; w/o, wheezes/ rales/ or rhonchi. no accessory muscle use, no dullness to percussion  CARD:  RRR, no m/r/g, no peripheral edema, pulses intact, no cyanosis or clubbing.  GI:   Soft & nt; nml bowel sounds; no organomegaly or masses detected.   Musco: Warm bil, no deformities or joint swelling noted.   Neuro: alert, no focal deficits noted.    Skin: Warm, no lesions or rashes    Lab Results:    ProBNP No results found for: PROBNP  Imaging: Dg Chest 2 View  Result Date: 08/28/2017 CLINICAL DATA:  Recent pneumonia.  Hypertension. EXAM: CHEST  2 VIEW COMPARISON:  August 03, 2017 and June 08, 2017 FINDINGS: There is stable opacity in portions of the right upper and mid lung compared to approximately 1 month prior, likely residual scarring from previous pneumonia. There is no new opacity evident. Left lung is clear. Heart size and pulmonary vascularity are normal. No adenopathy. No bone lesions. IMPRESSION: Residual opacity in the right upper and mid lung regions is likely due to scarring. No frank edema or consolidation. No new opacity. Stable cardiac silhouette. Electronically Signed   By: Lowella Grip III M.D.   On: 08/28/2017 10:10   Dg Chest 2 View  Result Date: 08/03/2017 CLINICAL DATA:  Pneumonia. EXAM: CHEST  2 VIEW COMPARISON:  Radiographs of July 17, 2017. CT scan of July 10, 2017. FINDINGS: The heart size and  mediastinal contours are within normal limits. No pneumothorax or pleural effusion is noted. Overall, there is improved bilateral lung opacities compared to prior exam, particularly involving the right midlung opacity. This suggests improving atypical inflammation. The visualized skeletal structures are unremarkable. IMPRESSION: Improved bilateral lung opacities compared to prior exam, consistent with improving pneumonia or atypical infection or inflammation. However, significant residual density remains in the right midlung, and continued radiographic follow-up is recommended to ensure resolution and rule out underlying neoplasm or malignancy. Electronically Signed   By: Marijo Conception, M.D.   On: 08/03/2017 12:50     Assessment & Plan:   Pneumonia Multifocal PNA -refractory to abx. ESR  and Eosinophils elevated. IgE 125.  Fob neg for malignancy . BAL +MAI . Cx neg otherwise.  Pt placed on steroid challenge with interval improvement and resolving opacites.  Will taper prednisone slowly over next 4-5 weeks and stop .  Have him follow up in 6 weeks with chest xray  Will need to discus if referral to ID is indicated for previous MAI+ BAL .       Rexene Edison, NP 08/28/2017

## 2017-08-28 NOTE — Patient Instructions (Addendum)
Continue on Prednisone 10mg  daily for 2 weeks then 5mg  daily for 2 weeks and take 5mg  daily every other day for 1 week and stop.  Follow up with Dr. Elsworth Soho  In 6 weeks with chest xray .  Please contact office for sooner follow up if symptoms do not improve or worsen or seek emergency care

## 2017-09-12 NOTE — Progress Notes (Signed)
Reviewed & agree with plan  

## 2017-10-09 ENCOUNTER — Ambulatory Visit (INDEPENDENT_AMBULATORY_CARE_PROVIDER_SITE_OTHER): Payer: 59 | Admitting: Adult Health

## 2017-10-09 ENCOUNTER — Ambulatory Visit: Payer: 59 | Admitting: Adult Health

## 2017-10-09 ENCOUNTER — Ambulatory Visit (INDEPENDENT_AMBULATORY_CARE_PROVIDER_SITE_OTHER)
Admission: RE | Admit: 2017-10-09 | Discharge: 2017-10-09 | Disposition: A | Discharge status: Home / Self Care | Payer: 59 | Source: Ambulatory Visit | Attending: Adult Health | Admitting: Adult Health

## 2017-10-09 ENCOUNTER — Encounter: Payer: Self-pay | Admitting: Adult Health

## 2017-10-09 VITALS — BP 108/64 | HR 74 | Ht 74.0 in | Wt 303.2 lb

## 2017-10-09 DIAGNOSIS — J189 Pneumonia, unspecified organism: Secondary | ICD-10-CM | POA: Diagnosis not present

## 2017-10-09 NOTE — Patient Instructions (Signed)
Continue on current regimen .  Follow up with Dr. Elsworth Soho  In 6 weeks and As needed

## 2017-10-09 NOTE — Progress Notes (Signed)
_0  ID: Christian Hubbard, male    DOB: 1961/01/28, 56 y.o.   MRN: [id number]  Chief Complaint  Patient presents with  . 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  CT chest done 12/09/15 with stable 3 mm RUL nodule and RLL pneumatocele.  6/26 CTA Chest >>neg for PE, widespread multifocal airspace consolidation compatible with severe multilobar PNA with evidence of early cavitation in some regions, trace right pleural effusion, severe hepatic steatosis, 2 vessel CAD  07/16/17 IgE 125.   10/09/2017 Follow up : PNA  Patient presents for a one-month follow-up.  Patient was seen last time for resolving pneumonia. /steroid responsive pulmonary infiltrates  Diagnosed with a multifocal pneumonia in June 2018.  CT chest showed multifocal bilateral consolidation with pneumatocele noted in the past.  Patient was admitted in August with progressive consolidation in the right lower lobe and multifocal consolidation along with nodules.  Workup showed a negative ANCA, negative drug screen.  Bronchoscopy on August 6 showed negative bacterial cultures.  Positive for MAI DNA probe.  AFB + MAI  8/6 , 8/13 AFB smear was negative. Path  was negative for malignancy.  AFB culture was negative on August 13.  ESR was elevated at 90.  Patient was treated with antibiotics and prolonged steroid course.  Chest x-ray showed interval improvement after steroids were started.  IgE was 125.  Eosinophils were elevated. Chest x-ray last visit showed residual scarring along the right midlung.  Patient was recommended to slowly taper off of prednisone. ,  He has been  off of prednisone for 1 week. No flare of cough or wheezing . Chest x-ray today shows clear left lung.  Stable scarring along the right midlung.  No acute process noted.Marland Kitchen He is feeling better .    No Known Allergies   There is no immunization history on file for this patient.  Past Medical History:  Diagnosis Date  . GERD (gastroesophageal reflux disease)   . Hyperlipemia   . Hypertension   . OSA on CPAP   . Pneumonia 08/10/2014; 07/10/2017    Tobacco History: Social History   Tobacco Use  Smoking Status Former Smoker  . Packs/day: 1.00  . Years: 24.00  . Pack years: 24.00  . Types: Cigarettes  . Last attempt to quit: 11/27/2004  . Years since quitting: 12.8  Smokeless Tobacco Never Used   Counseling given: Not Answered   Outpatient Encounter Medications as of 10/09/2017  Medication Sig  . amLODipine (NORVASC) 5 MG tablet Take 5 mg by mouth daily.   Marland Kitchen losartan-hydrochlorothiazide (HYZAAR) 100-12.5 MG tablet Take 1 tablet by mouth daily.  Marland Kitchen omeprazole (PRILOSEC) 40 MG capsule Take 40 mg by mouth daily.  . sildenafil (VIAGRA) 100 MG tablet Take 100 mg by mouth daily as needed for erectile dysfunction.  . pravastatin (PRAVACHOL) 40 MG tablet Take 40 mg by mouth daily.  . predniSONE (DELTASONE) 10 MG tablet Take 95m daily x7 days, then 253mx7days, and then hold at 1035m(Patient not taking: Reported on 10/09/2017)  . predniSONE (DELTASONE) 20 MG tablet Take 2 tabs daily x 3 weeks,  then decrease to 1.5 tab x 3 weeks, then continue as instructed. (Patient not taking: Reported on 10/09/2017)   No facility-administered encounter medications on file as of 10/09/2017.      Review of Systems  Constitutional:   No  weight loss, night sweats,  Fevers, chills, fatigue, or  lassitude.  HEENT:   No headaches,  Difficulty swallowing,  Tooth/dental problems, or  Sore throat,                No sneezing, itching, ear ache, nasal congestion, post nasal drip,   CV:  No chest pain,   Orthopnea, PND, swelling in lower extremities, anasarca, dizziness, palpitations, syncope.   GI  No heartburn, indigestion, abdominal pain, nausea, vomiting, diarrhea, change in bowel habits, loss of appetite, bloody stools.   Resp: No shortness of breath with exertion or at rest.  No excess mucus, no productive cough,  No non-productive cough,  No coughing up of blood.  No change in color of mucus.  No wheezing.  No chest wall deformity  Skin: no rash or lesions.  GU: no dysuria, change in color of urine, no urgency or frequency.  No flank pain, no hematuria   MS:  No joint pain or swelling.  No decreased range of motion.  No back pain.    Physical Exam  BP 108/64 (BP Location: Left Arm, Cuff Size: Large)   Pulse 74   Ht _0  (1.88 m)   Wt (!) 303 lb 3.2 oz (137.5 kg)   SpO2 96%   BMI 38.93 kg/m   GEN: A/Ox3; pleasant , NAD, obese    HEENT:  [place]/AT,  EACs-clear, TMs-wnl, NOSE-clear, THROAT-clear, no lesions, no postnasal drip or exudate noted.   NECK:  Supple w/ fair ROM; no JVD; normal carotid impulses w/o bruits; no thyromegaly or nodules palpated; no lymphadenopathy.    RESP  Clear  P & A; w/o, wheezes/ rales/ or rhonchi. no accessory muscle use, no dullness to percussion  CARD:  RRR, no m/r/g, no peripheral edema, pulses intact, no cyanosis or clubbing.  GI:   Soft & nt; nml bowel sounds; no organomegaly or masses detected.   Musco: Warm bil, no deformities or joint swelling noted.   Neuro: alert, no focal deficits noted.    Skin: Warm, no lesions or rashes    Lab Results:  CBC  ProBNP No results found for: PROBNP  Imaging: Dg Chest 2 View  Result Date: 10/09/2017 CLINICAL DATA:  Pneumonia. EXAM: CHEST  2 VIEW COMPARISON:  Radiographs of August 28, 2017. FINDINGS: The heart size and mediastinal contours are within normal limits. No pneumothorax or pleural effusion is noted. Left lung is clear. Stable probable scarring is seen in the right midlung. No acute  pulmonary disease is noted. The visualized skeletal structures are unremarkable. IMPRESSION: No active cardiopulmonary disease.  Stable right midlung scarring. Electronically Signed   By: Marijo Conception, M.D.   On: 10/09/2017 10:23     Assessment & Plan:   Pneumonia Multifocal PNA / steroid responsive infiltrates  Clinically and radiographical improvement  CXR is stable w/ resolution and only residual right mid lung scarring now off steroids  - follow up in 6 weeks .  May need referral to ID as previous BAL + MAI but follow up AFB was neg. Will follow clinically    Plan  Patient Instructions  Continue on current regimen .  Follow up with Dr. Elsworth Soho  In 6 weeks and As needed  Rexene Edison, NP 10/09/2017

## 2017-10-09 NOTE — Assessment & Plan Note (Signed)
Multifocal PNA / steroid responsive infiltrates  Clinically and radiographical improvement  CXR is stable w/ resolution and only residual right mid lung scarring now off steroids  - follow up in 6 weeks .  May need referral to ID as previous BAL + MAI but follow up AFB was neg. Will follow clinically    Plan  Patient Instructions  Continue on current regimen .  Follow up with Dr. Elsworth Soho  In 6 weeks and As needed

## 2017-10-12 NOTE — Progress Notes (Signed)
Reviewed & agree with plan  

## 2017-11-14 ENCOUNTER — Encounter: Payer: Self-pay | Admitting: Pulmonary Disease

## 2017-11-14 ENCOUNTER — Ambulatory Visit (INDEPENDENT_AMBULATORY_CARE_PROVIDER_SITE_OTHER): Payer: 59 | Admitting: Pulmonary Disease

## 2017-11-14 DIAGNOSIS — J189 Pneumonia, unspecified organism: Secondary | ICD-10-CM | POA: Diagnosis not present

## 2017-11-14 NOTE — Assessment & Plan Note (Addendum)
Steroid responsive multifocal PNA. Work up during hospitalization included negative ANCA. Bronchoscopy 07/02/17 with negative bacterial cultures. Positive MAI DNA probe but AFB smear & culture negative. Path negative for malignancy. Improved after long course of steroids, s/p taper. Clinically and radiographically improved. Residual right mid lung scarring. Follow up as needed.

## 2017-11-14 NOTE — Progress Notes (Signed)
'@Patient'  ID: [name], male    DOB: Apr 17, 1961, 56 y.o.   MRN: [id number]      Chief Complaint  Patient presents with  . 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  CT chest done 12/09/15 with stable 3 mm RUL nodule and RLL pneumatocele.  6/26 CTA Chest >>neg for PE, widespread multifocal airspace consolidation compatible with severe multilobar PNA with evidence of early cavitation in some regions, trace right pleural effusion, severe hepatic steatosis, 2 vessel CAD  06/2017: CT Chest w/ consolidation in the RLL w/ new multifocal peripheral consolidations  07/16/17 IgE 125.  06/2014 Admission: Patient was admitted in August with progressive consolidation in the right lower lobe and multifocal consolidation along with nodules.  Workup showed a negative ANCA, negative drug screen.  Bronchoscopy on August 6 showed negative bacterial cultures.  Positive for MAI DNA probe.  AFB + MAI  8/6 , 8/13 AFB smear was negative. Path  was negative for malignancy.  AFB culture was negative on August 13.  ESR was elevated at 90.  Patient was treated with antibiotics and prolonged steroid course.  Chest x-ray showed interval improvement after steroids were started.  IgE was 125.  Eosinophils were elevated.  10/09/2017 Follow up CXR: Improvement in consolidations  Clear  left lung.  Stable scarring along the right midlung.  No acute process noted.   Interval History Today 11/14/17: Patient is s/p a slow taper off of prednisone. He has been off of prednisone for 6 week. He  denies cough and fevers.   Reports persistent DOE that he feels is more related to his weight gain on steroids rather than his breathing. Overall doing well.   Recently "failed" a breathing test for work. Works in a [employer]. Unclear implications on his job.    No Known Allergies  There is no immunization history on file for this patient.  Past Medical History:  Diagnosis Date  . GERD (gastroesophageal reflux disease)   . Hyperlipemia   . Hypertension   . OSA on CPAP   . Pneumonia 08/10/2014; 07/10/2017    Tobacco History: Social History       Tobacco Use  Smoking Status Former Smoker  . Packs/day: 1.00  . Years: 24.00  . Pack years: 24.00  . Types: Cigarettes  . Last attempt to quit: 11/27/2004  . Years since quitting: 12.8  Smokeless Tobacco Never Used   Counseling given: Not Answered   Current Outpatient Medications:  .  amLODipine (NORVASC) 5 MG tablet, Take 5 mg by mouth daily. , Disp: , Rfl: 0 .  losartan-hydrochlorothiazide (HYZAAR) 100-12.5 MG tablet, Take 1 tablet by mouth daily., Disp: , Rfl: 0 .  omeprazole (PRILOSEC) 40 MG capsule, Take 40 mg by mouth daily., Disp: , Rfl:  .  pravastatin (PRAVACHOL) 40 MG tablet, Take 40 mg by mouth daily., Disp: , Rfl:  .  sildenafil (VIAGRA) 100 MG tablet, Take 100 mg by mouth daily as needed for erectile dysfunction., Disp: , Rfl:   Review of Systems  Constitutional:   No  weight loss, night sweats,  Fevers, chills, fatigue, or  lassitude.  HEENT:   No headaches,  Difficulty swallowing,  Tooth/dental problems, or  Sore throat,                No sneezing, itching, ear ache, nasal congestion, post nasal drip,   CV:  No chest pain,  Orthopnea, PND, swelling in lower extremities, anasarca, dizziness, palpitations, syncope.   GI  No heartburn, indigestion, abdominal pain, nausea, vomiting, diarrhea, change in bowel habits, loss of appetite, bloody stools.   Resp: Mild DOE.  No excess mucus, no productive  cough,  No non-productive cough,  No coughing up of blood.  No change in color of mucus.  No wheezing.  No chest wall deformity  Skin: no rash or lesions.  GU: no dysuria, change in color of urine, no urgency or frequency.  No flank pain, no hematuria   MS:  No joint pain or swelling.  No decreased range of motion.  No back pain.  Physical Exam  Vitals:   11/14/17 0911  BP: 132/62  Pulse: 71  SpO2: 100%    GEN: A/Ox3; pleasant , NAD, obese    HEENT:  [place]/AT,  EACs-clear, TMs-wnl, NOSE-clear, THROAT-clear, no lesions, no postnasal drip or exudate noted.   NECK:  Supple w/ fair ROM; no JVD; normal carotid impulses w/o bruits; no thyromegaly or nodules palpated; no lymphadenopathy.    RESP  Clear  P & A; w/o, wheezes/ rales/ or rhonchi. no accessory muscle use, no dullness to percussion  CARD:  RRR, no m/r/g, no peripheral edema, pulses intact, no cyanosis or clubbing.  GI:   Soft & nt; nml bowel sounds; no organomegaly or masses detected.   Musco: Warm bil, no deformities or joint swelling noted.   Neuro: alert, no focal deficits noted.    Skin: Warm, no lesions or rashes   Assessment & Plan: Please refer to problem based charting for A&P

## 2017-11-14 NOTE — Patient Instructions (Addendum)
Pneumonia has healed with scarring. Send is a copy of your breathing test done at work. Use precautions with mask/PPE to prevent exposure to chemicals Weight loss encouraged

## 2017-11-22 DIAGNOSIS — Z23 Encounter for immunization: Secondary | ICD-10-CM | POA: Diagnosis not present

## 2017-12-07 DIAGNOSIS — I10 Essential (primary) hypertension: Secondary | ICD-10-CM | POA: Diagnosis not present

## 2017-12-07 DIAGNOSIS — E1165 Type 2 diabetes mellitus with hyperglycemia: Secondary | ICD-10-CM | POA: Diagnosis not present

## 2017-12-07 DIAGNOSIS — K219 Gastro-esophageal reflux disease without esophagitis: Secondary | ICD-10-CM | POA: Diagnosis not present

## 2017-12-07 DIAGNOSIS — E782 Mixed hyperlipidemia: Secondary | ICD-10-CM | POA: Diagnosis not present

## 2018-06-07 DIAGNOSIS — I10 Essential (primary) hypertension: Secondary | ICD-10-CM | POA: Diagnosis not present

## 2018-06-07 DIAGNOSIS — E782 Mixed hyperlipidemia: Secondary | ICD-10-CM | POA: Diagnosis not present

## 2018-06-07 DIAGNOSIS — E1165 Type 2 diabetes mellitus with hyperglycemia: Secondary | ICD-10-CM | POA: Diagnosis not present

## 2018-06-07 DIAGNOSIS — K219 Gastro-esophageal reflux disease without esophagitis: Secondary | ICD-10-CM | POA: Diagnosis not present

## 2018-07-08 DIAGNOSIS — I10 Essential (primary) hypertension: Secondary | ICD-10-CM | POA: Diagnosis not present

## 2018-07-08 DIAGNOSIS — Z0000 Encounter for general adult medical examination without abnormal findings: Secondary | ICD-10-CM | POA: Diagnosis not present

## 2018-07-08 DIAGNOSIS — E119 Type 2 diabetes mellitus without complications: Secondary | ICD-10-CM | POA: Diagnosis not present

## 2018-07-08 DIAGNOSIS — E782 Mixed hyperlipidemia: Secondary | ICD-10-CM | POA: Diagnosis not present

## 2018-08-21 DIAGNOSIS — G4726 Circadian rhythm sleep disorder, shift work type: Secondary | ICD-10-CM | POA: Diagnosis not present

## 2018-08-21 DIAGNOSIS — G4733 Obstructive sleep apnea (adult) (pediatric): Secondary | ICD-10-CM | POA: Diagnosis not present

## 2018-09-06 IMAGING — DX DG CHEST 1V PORT
1 series · 1 of 1 positions shown · non-contrast
Comparison: 06/27/2017

CLINICAL DATA: Status post bronchoscopy

EXAM:
PORTABLE CHEST 1 VIEW

[chest ap]
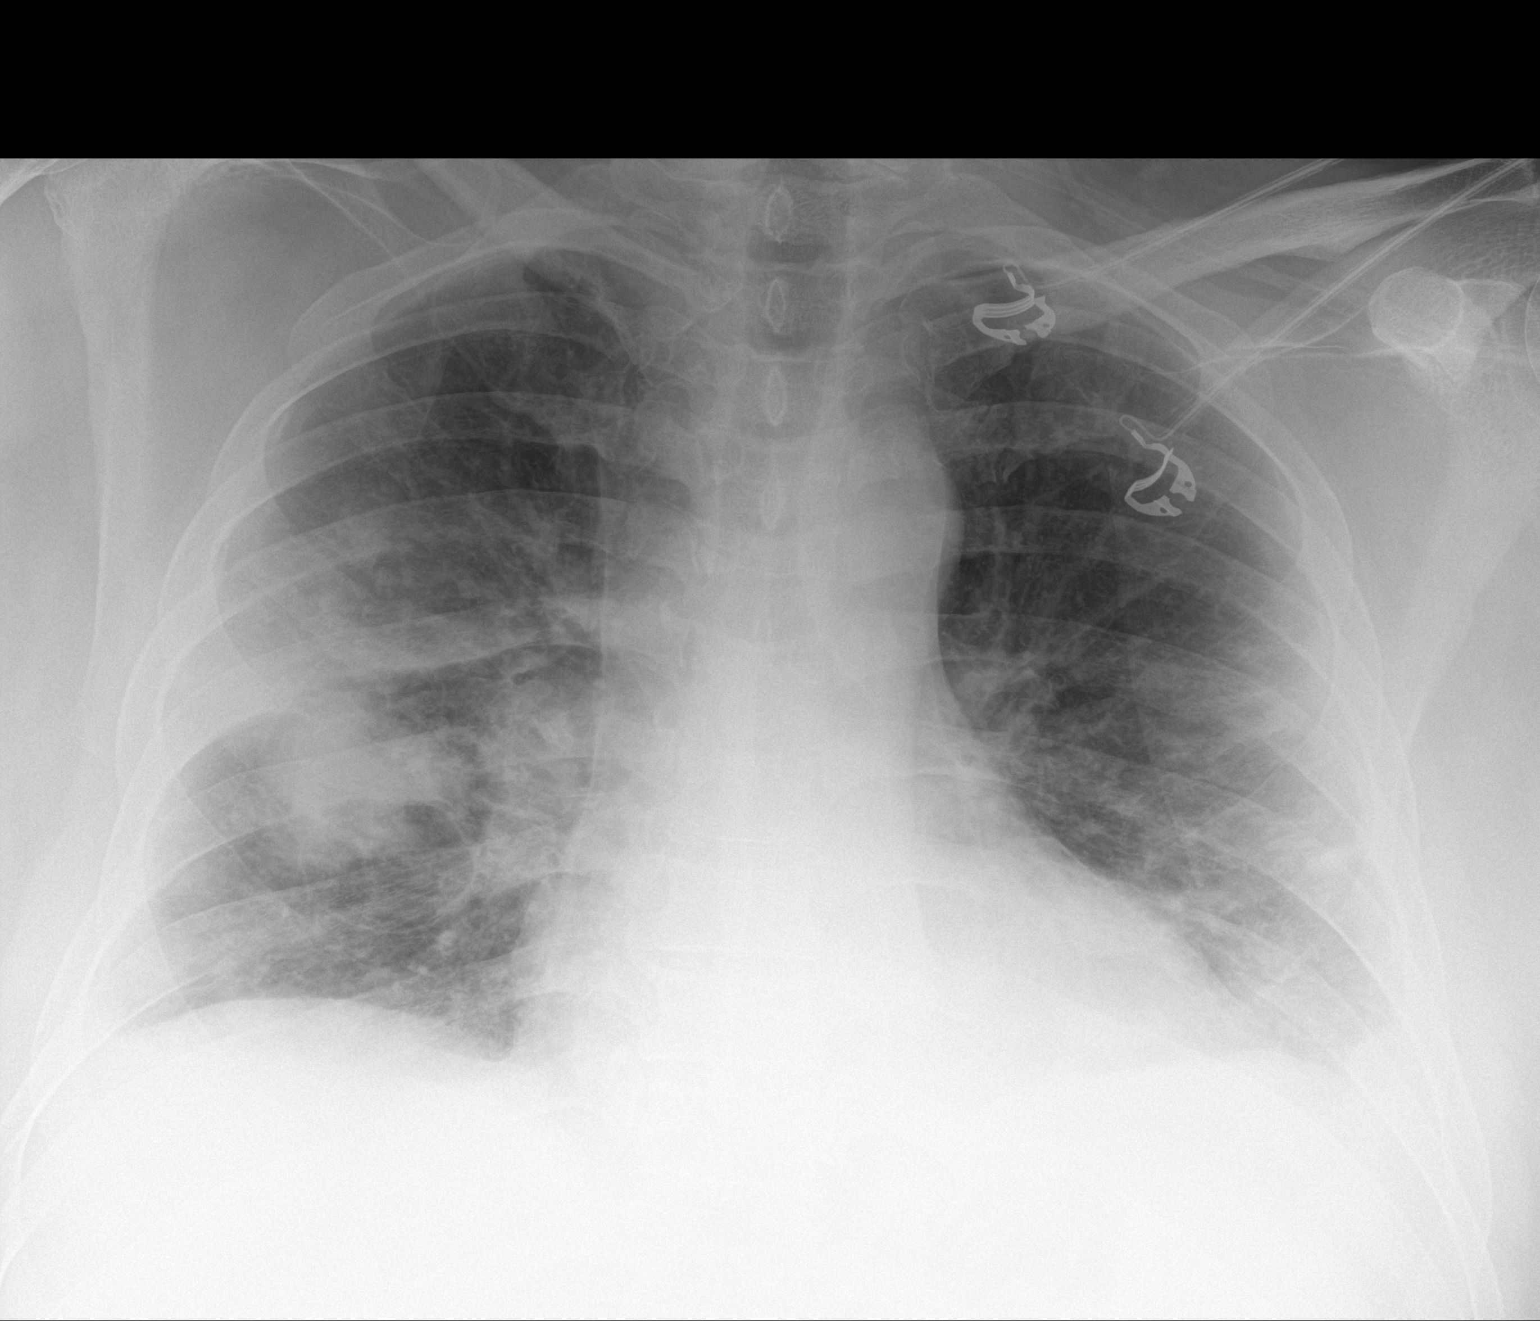

[1 of 1 positions shown; findings below may reference images not displayed]

FINDINGS: Cardiac shadow is stable. Patchy infiltrative changes again noted
throughout the right lung. Some bleb formation is noted as well.
Chronic changes are again seen on the left. No post bronchoscopy
pneumothorax is identified. The overall appearance is roughly stable
from the prior exam.
IMPRESSION: No evidence of post bronchoscopy pneumothorax.

Patchy changes bilaterally worse on the right than the left.

## 2018-09-14 IMAGING — CT CT CHEST W/O CM
2 of 3 series · 15 of 36 positions shown, 18 images · non-contrast
Comparison: Radiographs 07/09/2017, CT chest 05/22/2017,
12/18/2016, 12/09/2015, 04/16/2015

ADDENDUM:
Additional differential. For the findings in the chest, given
masslike appearance of the opacities, could also consider pulmonary
lymphoma.
CLINICAL DATA: Shortness of breath and fatigue for 2 months

EXAM:
CT CHEST WITHOUT CONTRAST
TECHNIQUE: Multidetector CT imaging of the chest was performed following the
standard protocol without IV contrast.

[Series 3: chest w/o 2mm st · axial · non-contrast · 0.93mm/px · z∈[+1293,+1575]mm · 12 of 167 slices shown, 15 images]
[im 13/167  mediastinal]
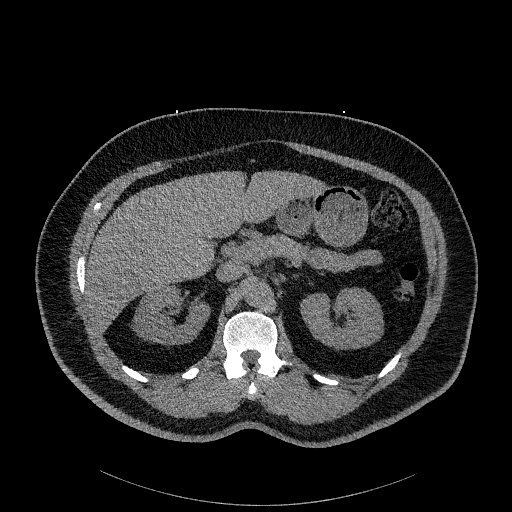
[im 13/167  lung]
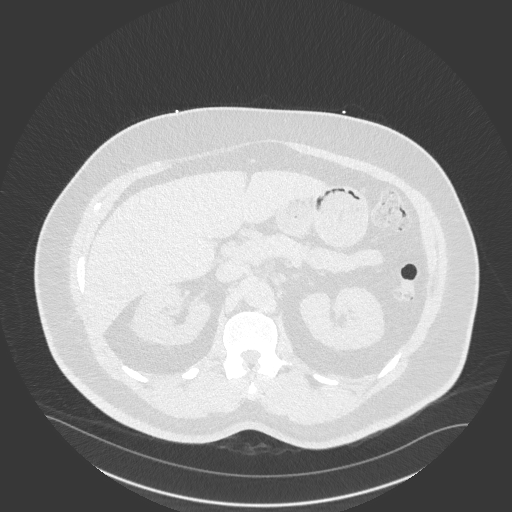
[im 25/167  lung]
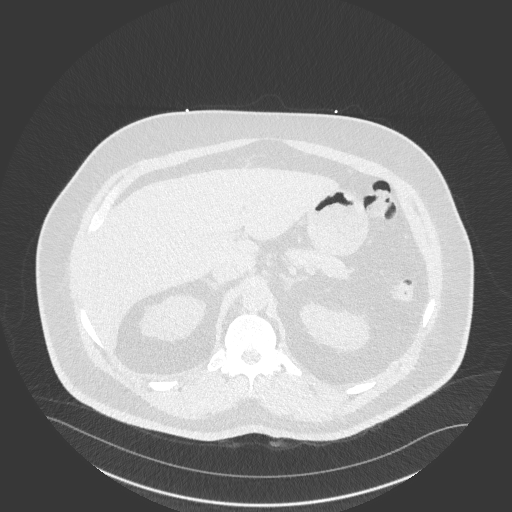
[im 37/167  lung]
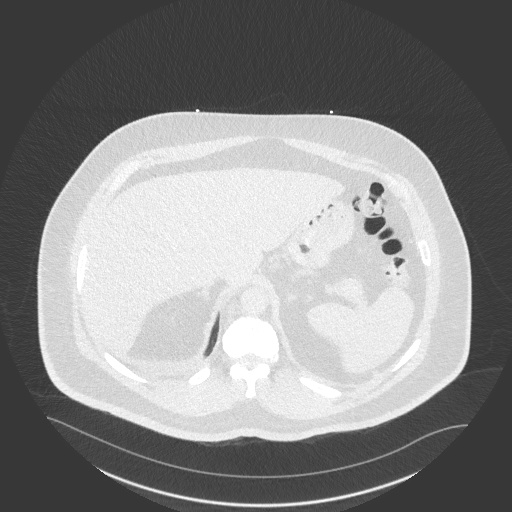
[im 50/167  lung]
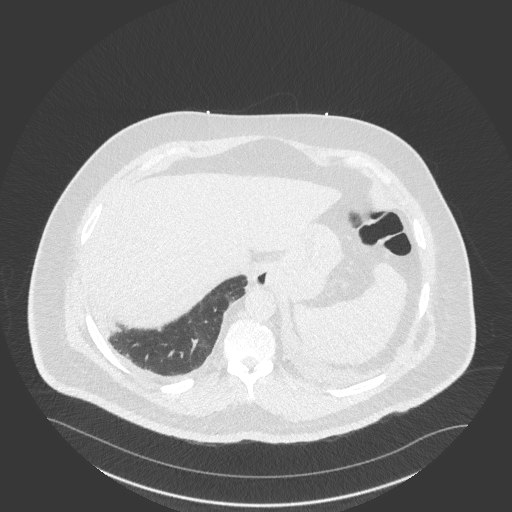
[im 62/167  mediastinal]
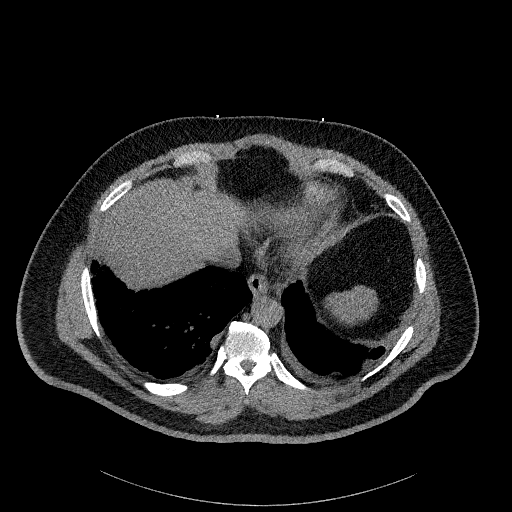
[im 62/167  lung]
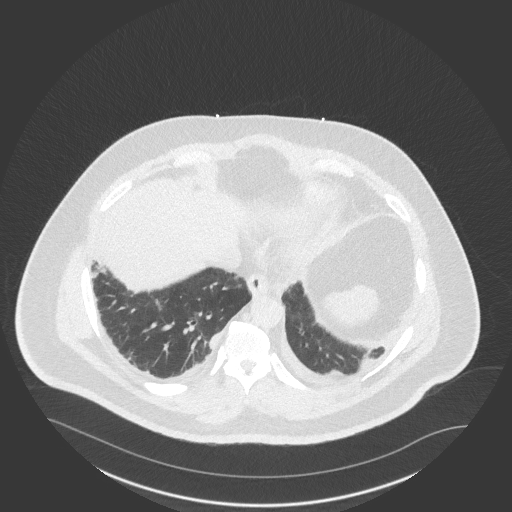
[im 74/167  lung]
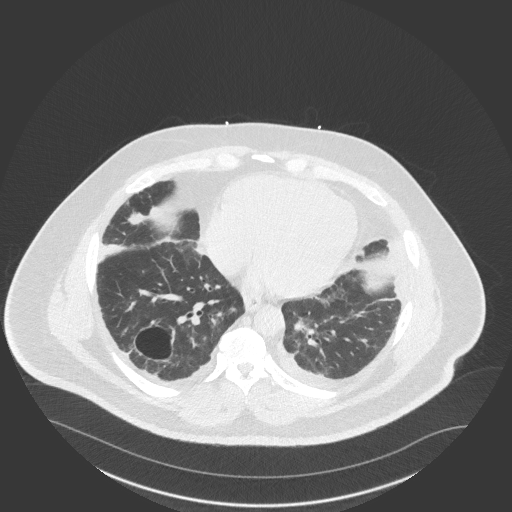
[im 93/167  lung]
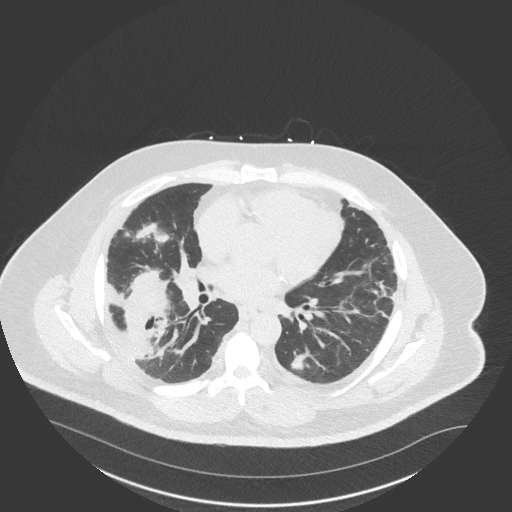
[im 105/167  lung]
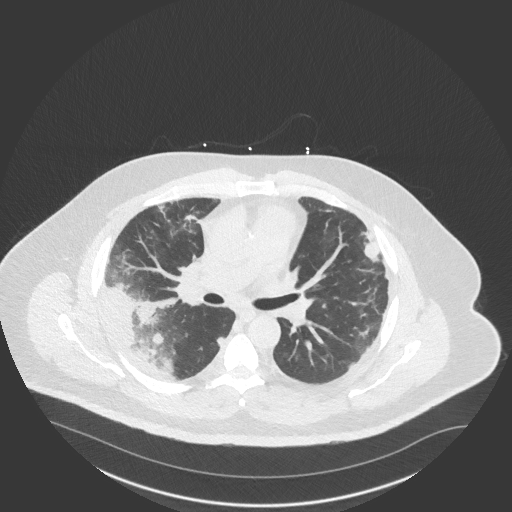
[im 117/167  mediastinal]
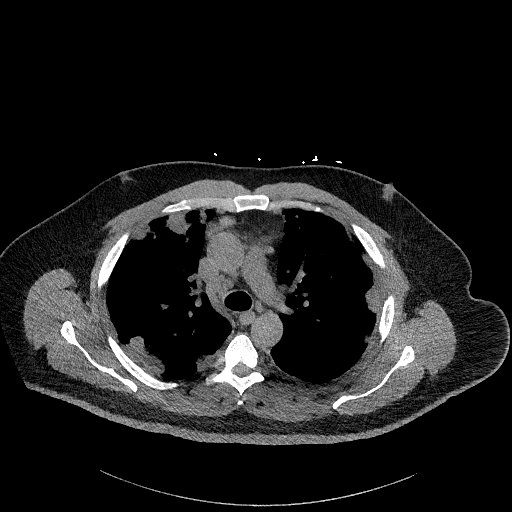
[im 117/167  lung]
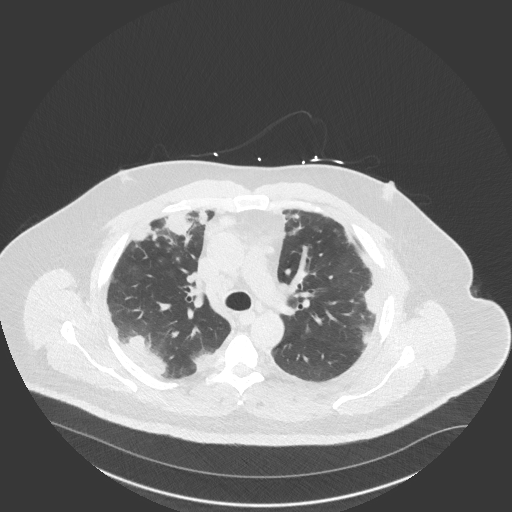
[im 130/167  lung]
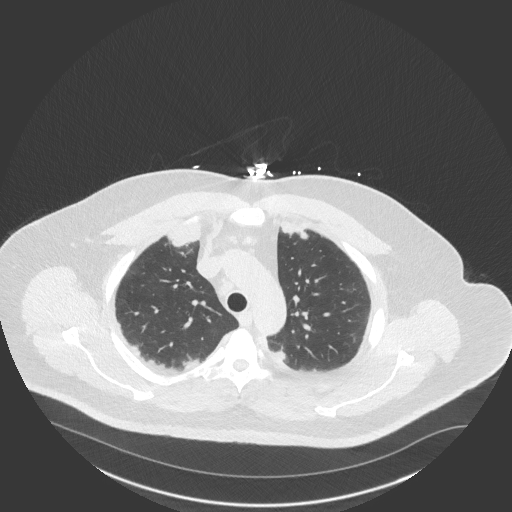
[im 142/167  lung]
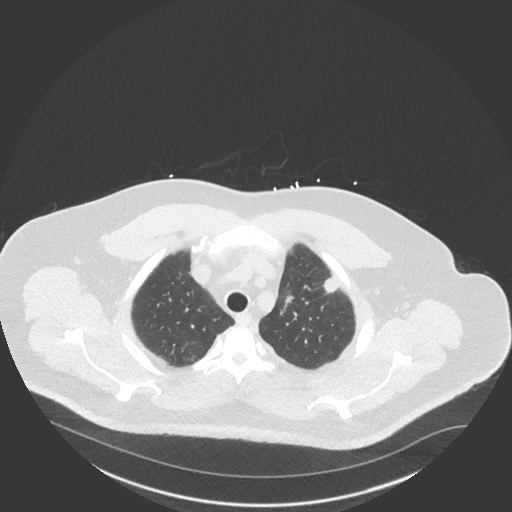
[im 154/167  lung]
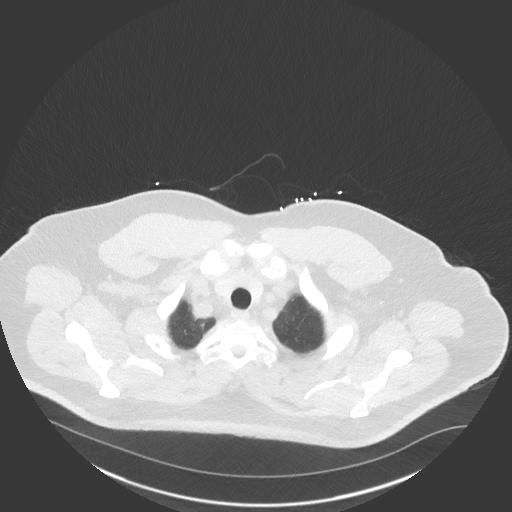

[Series 5: chest w/o 3mm st cor · coronal · non-contrast · 0.69mm/px · 3 of 108 slices shown]
[im 22/108  lung]
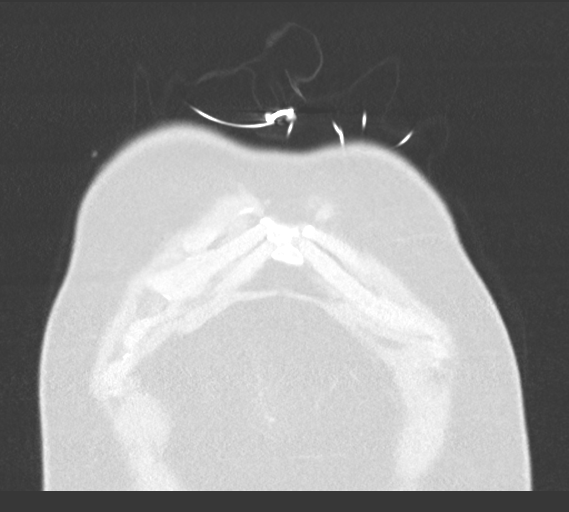
[im 43/108  lung]
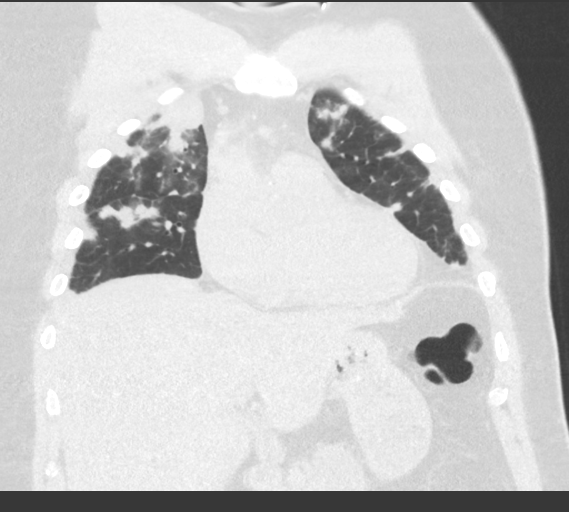
[im 65/108  lung]
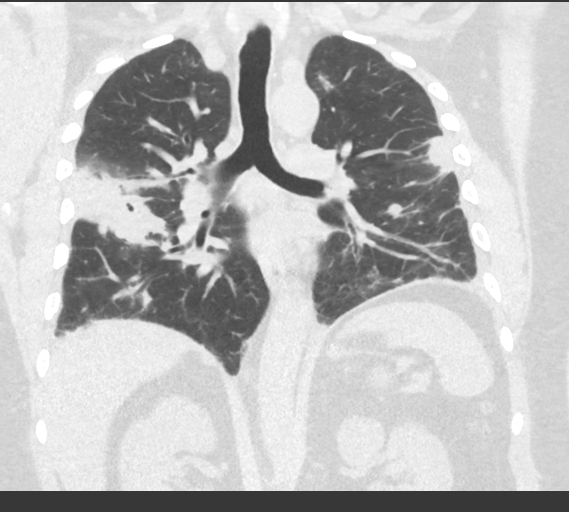

[15 of 36 positions shown; findings below may reference images not displayed]

FINDINGS: Cardiovascular: Limited without intravenous contrast. Non aneurysmal
aorta. Scattered atherosclerotic calcification. Coronary artery
calcification. Normal heart size. No large pericardial effusion

Mediastinum/Nodes: mild mediastinal adenopathy, with lymph nodes
measuring up to 11 mm in the right paratracheal space. Midline
trachea. No thyroid mass. Esophagus within normal limits

Lungs/Pleura: Small left pleural effusion. Negative for
pneumothorax. Thin walled cyst in the right lower lobe stable
compared with multiple prior studies. Progressive consolidation in
the right lower lobe since prior CT. Worsening, multifocal, largely
peripheral consolidations and nodules are now visualized.

Upper Abdomen: No acute abnormality is seen

Musculoskeletal: No chest wall mass or suspicious bone lesions
identified.
IMPRESSION: 1. Small left pleural effusion. Progressive consolidation in the
right lower lobe since prior CT. Development of multifocal,
primarily peripheral consolidations and pulmonary nodules.
Distribution not entirely typical for bacterial pneumonia.
Differential considerations include septic emboli, fungal pneumonia,
and given peripheral distribution, could consider organizing
pneumonia or eosinophilic pneumonia.
2. Mild mediastinal adenopathy, likely reactive

Aortic Atherosclerosis ([id number]).

## 2018-11-21 DIAGNOSIS — R109 Unspecified abdominal pain: Secondary | ICD-10-CM | POA: Diagnosis not present

## 2018-11-21 DIAGNOSIS — M549 Dorsalgia, unspecified: Secondary | ICD-10-CM | POA: Diagnosis not present

## 2018-12-20 DIAGNOSIS — G4733 Obstructive sleep apnea (adult) (pediatric): Secondary | ICD-10-CM | POA: Diagnosis not present

## 2018-12-20 DIAGNOSIS — G4726 Circadian rhythm sleep disorder, shift work type: Secondary | ICD-10-CM | POA: Diagnosis not present

## 2019-01-13 DIAGNOSIS — E1169 Type 2 diabetes mellitus with other specified complication: Secondary | ICD-10-CM | POA: Diagnosis not present

## 2019-01-13 DIAGNOSIS — E782 Mixed hyperlipidemia: Secondary | ICD-10-CM | POA: Diagnosis not present

## 2019-01-13 DIAGNOSIS — Z23 Encounter for immunization: Secondary | ICD-10-CM | POA: Diagnosis not present

## 2019-01-13 DIAGNOSIS — I10 Essential (primary) hypertension: Secondary | ICD-10-CM | POA: Diagnosis not present

## 2019-02-25 ENCOUNTER — Institutional Professional Consult (permissible substitution): Payer: 59 | Admitting: Plastic Surgery

## 2019-03-11 ENCOUNTER — Institutional Professional Consult (permissible substitution): Payer: 59 | Admitting: Plastic Surgery

## 2019-04-10 ENCOUNTER — Institutional Professional Consult (permissible substitution): Payer: 59 | Admitting: Plastic Surgery

## 2019-04-22 ENCOUNTER — Ambulatory Visit: Payer: 59 | Admitting: Plastic Surgery

## 2019-04-22 ENCOUNTER — Encounter: Payer: Self-pay | Admitting: Plastic Surgery

## 2019-04-22 ENCOUNTER — Other Ambulatory Visit: Payer: Self-pay

## 2019-04-22 VITALS — BP 136/89 | HR 81 | Temp 98.1°F | Ht 74.0 in | Wt 325.0 lb

## 2019-04-22 DIAGNOSIS — K429 Umbilical hernia without obstruction or gangrene: Secondary | ICD-10-CM

## 2019-04-22 DIAGNOSIS — R221 Localized swelling, mass and lump, neck: Secondary | ICD-10-CM | POA: Diagnosis not present

## 2019-04-22 NOTE — Progress Notes (Signed)
Patient ID: Christian Hubbard, male    DOB: 08-21-1961, 58 y.o.   MRN: [id number]   Chief Complaint  Patient presents with  . Advice Only    The patient is a 58 year old male here for evaluation of his left neck area.  The patient states that he has noticed a mass in the left neck for the past year.  It seems to be about the same size.  But now he is been noticing that he is having trouble hearing from the left ear.  The patient states his sister had a mass in the same area he is not sure what it was but it was excised.  He is 6 feet 2 inches tall.  He weighs 325 pounds.  He has type 2 diabetes, obstructive sleep apnea, gastroesophageal reflux and has had pneumonia twice in the past year.  He is not aware of any studies that have been done on the mass.  And he was seen and sent by a dermatologist.  Nothing seems to make it better.  He does not know of any other family history.  The mass is on the left anterior neck.  It is 7 x 10 cm in size.  Slightly movable and soft to moderate firmness.  I do not feel any lymphadenopathy posterior or around the clavicles.  I cannot feel the lymph nodes under the lesion.  He does have cerumen impaction in both ear canals.  The left is darker in color.  He was also found to have a large umbilical hernia.  He says it has been getting larger.   Review of Systems  Constitutional: Negative.  Negative for activity change and appetite change.  HENT: Positive for ear pain.   Eyes: Negative.  Negative for discharge.  Respiratory: Negative.  Negative for chest tightness and shortness of breath.   Cardiovascular: Negative.  Negative for leg swelling.  Gastrointestinal: Negative for abdominal distention and abdominal pain.  Endocrine: Negative.   Genitourinary: Negative.   Musculoskeletal: Negative.  Negative for joint swelling.  Skin: Negative.  Negative for wound.  Neurological: Negative.   Hematological: Negative.   Psychiatric/Behavioral: Negative.      Past Medical History:  Diagnosis Date  . GERD (gastroesophageal reflux disease)   . Hyperlipemia   . Hypertension   . OSA on CPAP   . Pneumonia 08/10/2014; 07/10/2017    Past Surgical History:  Procedure Laterality Date  . VIDEO BRONCHOSCOPY Bilateral 07/02/2017   Procedure: VIDEO BRONCHOSCOPY WITH FLUORO;  Surgeon: Rigoberto Noel, MD;  Location: [facility];  Service: Cardiopulmonary;  Laterality: Bilateral;      Current Outpatient Medications:  .  amLODipine (NORVASC) 5 MG tablet, Take 5 mg by mouth daily. , Disp: , Rfl: 0 .  losartan-hydrochlorothiazide (HYZAAR) 100-12.5 MG tablet, Take 1 tablet by mouth daily., Disp: , Rfl: 0 .  omeprazole (PRILOSEC) 40 MG capsule, Take 40 mg by mouth daily., Disp: , Rfl:  .  pravastatin (PRAVACHOL) 40 MG tablet, Take 40 mg by mouth daily., Disp: , Rfl:  .  sildenafil (VIAGRA) 100 MG tablet, Take 100 mg by mouth daily as needed for erectile dysfunction., Disp: , Rfl:    Objective:   Vitals:   04/22/19 0821  BP: 136/89  Pulse: 81  Temp: 98.1 F (36.7 C)  SpO2: 99%    Physical Exam Vitals signs and nursing note reviewed.  Constitutional:      Appearance: Normal appearance.  HENT:     Head: Normocephalic  and atraumatic.     Right Ear: External ear normal. There is no impacted cerumen.     Left Ear: External ear normal. There is no impacted cerumen.     Nose: Nose normal.     Mouth/Throat:     Mouth: Mucous membranes are moist.  Eyes:     Extraocular Movements: Extraocular movements intact.     Pupils: Pupils are equal, round, and reactive to light.  Neck:     Musculoskeletal: No neck rigidity or muscular tenderness.  Cardiovascular:     Rate and Rhythm: Normal rate.  Pulmonary:     Effort: Pulmonary effort is normal. No respiratory distress.  Abdominal:     General: Abdomen is flat.  Skin:    General: Skin is warm.     Capillary Refill: Capillary refill takes less than 2 seconds.  Neurological:     General: No focal deficit  present.     Mental Status: He is alert.  Psychiatric:        Mood and Affect: Mood normal.        Behavior: Behavior normal.        Thought Content: Thought content normal.     Assessment & Plan:  Umbilical hernia without obstruction and without gangrene  Neck mass  Localized swelling, mass or lump of neck - Plan: CT Soft Tissue Neck W Contrast Recommend CT scan of neck.  Depending on the results we may need a fine-needle aspiration biopsy.  Consider ENT consult.  Debrox for the cerumen impactions.  I have reviewed his chart and with his comorbidities the lesion is concerning.  We spent time discussing my concerns and his options as well. We will also send for referral to general surgery for the umbilical hernia.  It may be possible to do a repair at the same time. Pictures were taken and placed in the chart with the patient's permission.  Animas, DO

## 2019-05-23 ENCOUNTER — Other Ambulatory Visit: Payer: Self-pay

## 2019-05-23 ENCOUNTER — Ambulatory Visit (HOSPITAL_COMMUNITY)
Admission: RE | Admit: 2019-05-23 | Discharge: 2019-05-23 | Disposition: A | Discharge status: Home / Self Care | Payer: 59 | Source: Ambulatory Visit | Attending: Plastic Surgery | Admitting: Plastic Surgery

## 2019-05-23 DIAGNOSIS — R221 Localized swelling, mass and lump, neck: Secondary | ICD-10-CM | POA: Insufficient documentation

## 2019-05-23 MED ORDER — IOHEXOL 300 MG/ML  SOLN
100.0000 mL | Freq: Once | INTRAMUSCULAR | Status: AC | PRN
  Administered 2019-05-23: 100 mL via INTRAVENOUS

## 2019-06-30 NOTE — Progress Notes (Signed)
Patient ID: Christian Hubbard, male    DOB: [birth year], 58 y.o.   MRN: [id number]  Chief Complaint  Patient presents with  . Pre-op Exam    for excision of (L) cheek lipoma      ICD-10-CM   1. Neck mass  R22.1   2. Localized swelling, mass or lump of neck  R22.1      History of Present Illness: Christian Hubbard is a 58 y.o.  male  with a history of left anterior neck mass 7 x 10 cm in size.  He presents for preoperative evaluation for upcoming procedure, excision of left neck/cheek mass, scheduled for 07/17/2019 with Dr. Marla Roe.  He also has an umbilical hernia. We are going to try and coordinate with general surgery to have it completed at the same time. He states he received a letter in the mail just last night, but was unsure of who it was from. He plans to call me back tonight with the info once he is home.  Christian Hubbard has a history of OSA, HTN, HLD, GERD and has had PNA twice in the past year.  He is a former smoker with 24 pack years. He quit on 11/27/2004. The patient has not had problems with anesthesia. He does not take any anticoagulants. No Hx of MI, TIA/Stroke.  The patient does not have a family history of anesthesia problems.    Denies any recent illnesses or colds. He does not have any visual changes, neck pain. Overall, he has been doing well.   Past Medical History: Allergies: No Known Allergies  Current Medications:  Current Outpatient Medications:  .  amLODipine (NORVASC) 5 MG tablet, Take 5 mg by mouth daily. , Disp: , Rfl: 0 .  hydrochlorothiazide (HYDRODIURIL) 12.5 MG tablet, Take 12.5 mg by mouth every morning., Disp: , Rfl:  .  losartan (COZAAR) 100 MG tablet, TK 1 T PO QD, Disp: , Rfl:  .  losartan-hydrochlorothiazide (HYZAAR) 100-12.5 MG tablet, Take 1 tablet by mouth daily., Disp: , Rfl: 0 .  metFORMIN (GLUCOPHAGE) 500 MG tablet, TK 1 T PO BID WC, Disp: , Rfl:  .  omeprazole (PRILOSEC) 40 MG capsule, Take 40 mg by mouth daily., Disp: , Rfl:   .  pravastatin (PRAVACHOL) 40 MG tablet, Take 40 mg by mouth daily., Disp: , Rfl:  .  sildenafil (VIAGRA) 100 MG tablet, Take 100 mg by mouth daily as needed for erectile dysfunction., Disp: , Rfl:   Past Medical Problems: Past Medical History:  Diagnosis Date  . GERD (gastroesophageal reflux disease)   . Hyperlipemia   . Hypertension   . OSA on CPAP   . Pneumonia 08/10/2014; 07/10/2017    Past Surgical History: Past Surgical History:  Procedure Laterality Date  . VIDEO BRONCHOSCOPY Bilateral 07/02/2017   Procedure: VIDEO BRONCHOSCOPY WITH FLUORO;  Surgeon: Rigoberto Noel, MD;  Location: [facility];  Service: Cardiopulmonary;  Laterality: Bilateral;    Social History: Social History   Socioeconomic History  . Marital status: Divorced    Spouse name: Not on file  . Number of children: Not on file  . Years of education: Not on file  . Highest education level: Not on file  Occupational History  . Not on file  Social Needs  . Financial resource strain: Not on file  . Food insecurity    Worry: Not on file    Inability: Not on file  . Transportation needs    Medical: Not on file  Non-medical: Not on file  Tobacco Use  . Smoking status: Former Smoker    Packs/day: 1.00    Years: 24.00    Pack years: 24.00    Types: Cigarettes    Quit date: 11/27/2004    Years since quitting: 14.6  . Smokeless tobacco: Never Used  Substance and Sexual Activity  . Alcohol use: Yes    Alcohol/week: 6.0 standard drinks    Types: 6 Cans of beer per week  . Drug use: No  . Sexual activity: Not Currently  Lifestyle  . Physical activity    Days per week: Not on file    Minutes per session: Not on file  . Stress: Not on file  Relationships  . Social Herbalist on phone: Not on file    Gets together: Not on file    Attends religious service: Not on file    Active member of club or organization: Not on file    Attends meetings of clubs or organizations: Not on file     Relationship status: Not on file  . Intimate partner violence    Fear of current or ex partner: Not on file    Emotionally abused: Not on file    Physically abused: Not on file    Forced sexual activity: Not on file  Other Topics Concern  . Not on file  Social History Narrative  . Not on file    Family History: History reviewed. No pertinent family history.  Review of Systems: Review of Systems  Constitutional: Negative for chills, fever and weight loss.  HENT: Positive for ear pain. Negative for ear discharge and tinnitus.   Eyes: Negative.   Respiratory: Negative.   Cardiovascular: Negative.   Gastrointestinal: Negative.   Genitourinary: Negative.   Musculoskeletal: Negative.   Skin: Negative for itching and rash.       + mass (left neck/cheek)   Neurological: Negative.  Negative for dizziness, tingling, speech change and headaches.    Physical Exam: Vital Signs BP (!) 142/89 (BP Location: Left Arm, Patient Position: Sitting, Cuff Size: Large)   Pulse 86   Temp 97.8 F (36.6 C) (Temporal)   Ht 6\' 2"  (1.88 m)   Wt (!) 324 lb 3.2 oz (147.1 kg)   SpO2 96%   BMI 41.62 kg/m   Physical Exam Constitutional:      General: He is not in acute distress.    Appearance: Normal appearance. He is not ill-appearing.  HENT:     Head: Atraumatic.     Right Ear: External ear normal.     Left Ear: External ear normal.  Cardiovascular:     Pulses: Normal pulses.  Abdominal:     General: Abdomen is flat. Bowel sounds are normal. There is no distension.     Palpations: Abdomen is soft. There is no mass.     Tenderness: There is no abdominal tenderness.     Hernia: A hernia is present.  Musculoskeletal: Normal range of motion.  Skin:    General: Skin is warm and dry.     Capillary Refill: Capillary refill takes less than 2 seconds.  Neurological:     General: No focal deficit present.     Mental Status: He is alert and oriented to person, place, and time. Mental status is at  baseline.  Psychiatric:        Mood and Affect: Mood normal.        Behavior: Behavior normal.    Assessment/Plan:  Christian Hubbard is scheduled for excision of left neck mass with Dr. Marla Roe on 07/17/2019.  Risks, benefits, and alternatives of procedure discussed, questions answered and consent obtained.    Christian. Leu is scheduled for his COVID test on 8/17. He would prefer to not have any pain medication sent in if it is not needed. If he does not have the hernia repair, pain medication will likely not be needed, other than APAP, ibuprofen. If we are able to do a co-surgery, he may need more pain management medications. Will update prescriptions if needed.  Waiting for call back from Christian. Glorioso in regards to the letter received from general surgery.  The risks that can be encountered with and after surgery for excison of a neck mass were discussed and include the following but not limited to these: bleeding, infection, delayed healing, anesthesia risks, skin sensation changes, injury to structures including nerves, blood vessels, and muscles which may be temporary or permanent, allergies to tape, suture materials and glues, blood products, topical preparations or injected agents, skin contour irregularities, skin discoloration and swelling, deep vein thrombosis, cardiac and pulmonary complications, pain, which may persist, persistent pain, recurrence of the lesion, poor healing of the incision, possible need for revisional surgery or staged procedures.  Electronically signed by: Carola Rhine Takeria Marquina, PA-C 07/01/2019 3:48 PM

## 2019-07-01 ENCOUNTER — Telehealth: Payer: Self-pay

## 2019-07-01 ENCOUNTER — Encounter: Payer: Self-pay | Admitting: Surgical

## 2019-07-01 ENCOUNTER — Ambulatory Visit (INDEPENDENT_AMBULATORY_CARE_PROVIDER_SITE_OTHER): Payer: 59 | Admitting: Surgical

## 2019-07-01 ENCOUNTER — Other Ambulatory Visit: Payer: Self-pay

## 2019-07-01 VITALS — BP 142/89 | HR 86 | Temp 97.8°F | Ht 74.0 in | Wt 324.2 lb

## 2019-07-01 DIAGNOSIS — R221 Localized swelling, mass and lump, neck: Secondary | ICD-10-CM

## 2019-07-01 NOTE — Telephone Encounter (Signed)
Spoke with Mr. Christian Hubbard over the phone.  Will contact Dr. Marlou Starks to see if he can see the patient and evaluate and determine if he will be able to coordinate surgery with [place] on 20 August, 2020.

## 2019-07-01 NOTE — Telephone Encounter (Signed)
Patient called to give physicians name that would possibly be doing a surgery with Dr Marla Roe. Physician is Dr. Autumn Messing. Software engineer, PA made aware and stated he would contact same

## 2019-07-04 ENCOUNTER — Telehealth: Payer: Self-pay

## 2019-07-04 NOTE — Telephone Encounter (Signed)
Received call from Mr Christian Hubbard in regards to his surgery scheduled for 8/20. Patient states he made appointment with Dr. Marlou Hubbard on 8/17 and has his COVID test same day. Patient requesting call back from scheduler or PA in regards to if Dr. Marlou Hubbard will be able to join Dr. Marla Hubbard in surgery on the 20th. Patient also has a few questions about surgery.

## 2019-07-14 ENCOUNTER — Other Ambulatory Visit (HOSPITAL_COMMUNITY): Payer: 59

## 2019-07-25 ENCOUNTER — Encounter: Payer: 59 | Admitting: Surgical

## 2019-08-05 ENCOUNTER — Ambulatory Visit: Payer: Self-pay | Admitting: General Surgery

## 2019-09-01 NOTE — Progress Notes (Signed)
History of Present Illness: Christian Hubbard is a 58 y.o.  male  with a history of left anterior neck mass.  He presents for preoperative evaluation for upcoming procedure, excision of left cheek lipoma, scheduled for 09/17/19 with Dr. Marla Roe. CT scan read as "palpable complaint reflects a 6 cm x 7 cm subcutaneous simple lipoma." Patient also has an umbilical hernia that will be repaired by Dr. Marlou Starks at the same time. Patient noticed the neck mass about 10 years ago. Patient states it has not changed in size. Patient denies any pain or discomfort at the site. Patient states his sister had a hard neck mass in the same area that was removed. Patient is unsure of the results of his sister's pathology, but states she has a large scar. An incidental finding of "partially covered nodule in the anterior and upper mediastinum" was observed on CT. Patient is scheduled to see Dr. Lucianne Lei Tight with CT surgery.   Patient's medical history is significant for OSA on CPAP, GERD, HTN, HLD, and PNA (2015, 2018).Patient states he was recently diagnosed with Type 2 Diabetes Mellitus and is taking metformin. Past surgical history is significant for bronchoscopy in 2018. Patient denies any adverse reactions to anesthesia. No hx MI, TIA/stroke. Patient quit smoking in 2006. No known allergies.    Past Medical History: Allergies: No Known Allergies  Current Medications:  Current Outpatient Medications:  .  amLODipine (NORVASC) 5 MG tablet, Take 5 mg by mouth daily. , Disp: , Rfl: 0 .  hydrochlorothiazide (HYDRODIURIL) 12.5 MG tablet, Take 12.5 mg by mouth every morning., Disp: , Rfl:  .  losartan (COZAAR) 100 MG tablet, TK 1 T PO QD, Disp: , Rfl:  .  losartan-hydrochlorothiazide (HYZAAR) 100-12.5 MG tablet, Take 1 tablet by mouth daily., Disp: , Rfl: 0 .  metFORMIN (GLUCOPHAGE) 500 MG tablet, TK 1 T PO BID WC, Disp: , Rfl:  .  omeprazole (PRILOSEC) 40 MG capsule, Take 40 mg by mouth daily., Disp: , Rfl:  .   OneTouch Delica Lancets [id number] MISC, , Disp: , Rfl:  .  ONETOUCH VERIO test strip, USE TO CHECK BLOOD SUGARS ONCE DAILY ALTERNATING MORNING AND EVENINGS BEFORE MEALS, Disp: , Rfl:  .  pravastatin (PRAVACHOL) 40 MG tablet, Take 40 mg by mouth daily., Disp: , Rfl:  .  sildenafil (VIAGRA) 100 MG tablet, Take 100 mg by mouth daily as needed for erectile dysfunction., Disp: , Rfl:   Past Medical Problems: Past Medical History:  Diagnosis Date  . GERD (gastroesophageal reflux disease)   . Hyperlipemia   . Hypertension   . OSA on CPAP   . Pneumonia 08/10/2014; 07/10/2017    Past Surgical History: Past Surgical History:  Procedure Laterality Date  . VIDEO BRONCHOSCOPY Bilateral 07/02/2017   Procedure: VIDEO BRONCHOSCOPY WITH FLUORO;  Surgeon: Rigoberto Noel, MD;  Location: [facility];  Service: Cardiopulmonary;  Laterality: Bilateral;    The patient HAS had anesthesia or sedation in the past.   The patient has NOT had problems with anesthesia.  The patient does NOT have a family history of anesthesia problems.    Social History: Social History   Socioeconomic History  . Marital status: Divorced    Spouse name: Not on file  . Number of children: Not on file  . Years of education: Not on file  . Highest education level: Not on file  Occupational History  . Not on file  Social Needs  . Financial resource strain: Not on file  .  Food insecurity    Worry: Not on file    Inability: Not on file  . Transportation needs    Medical: Not on file    Non-medical: Not on file  Tobacco Use  . Smoking status: Former Smoker    Packs/day: 1.00    Years: 24.00    Pack years: 24.00    Types: Cigarettes    Quit date: 11/27/2004    Years since quitting: 14.7  . Smokeless tobacco: Never Used  Substance and Sexual Activity  . Alcohol use: Yes    Alcohol/week: 6.0 standard drinks    Types: 6 Cans of beer per week  . Drug use: No  . Sexual activity: Not Currently  Lifestyle  . Physical activity     Days per week: Not on file    Minutes per session: Not on file  . Stress: Not on file  Relationships  . Social Herbalist on phone: Not on file    Gets together: Not on file    Attends religious service: Not on file    Active member of club or organization: Not on file    Attends meetings of clubs or organizations: Not on file    Relationship status: Not on file  . Intimate partner violence    Fear of current or ex partner: Not on file    Emotionally abused: Not on file    Physically abused: Not on file    Forced sexual activity: Not on file  Other Topics Concern  . Not on file  Social History Narrative  . Not on file    Family History: History reviewed. No pertinent family history.  Review of Systems: General ROS: negative Dermatological ROS: negative Cardiovascular ROS: no chest pain or dyspnea on exertion ENT ROS: negative Gastrointestinal ROS: no abdominal pain, change in bowel habits, or black or bloody stools  Physical Exam: Vital Signs BP 133/87 (BP Location: Left Arm, Patient Position: Sitting, Cuff Size: Large)   Pulse 76   Temp 97.7 F (36.5 C) (Temporal)   Ht 6\' 3"  (1.905 m)   Wt (!) 317 lb 9.6 oz (144.1 kg)   SpO2 97%   BMI 39.70 kg/m  General:alert, appears stated age and no distress HEENT: teeth intact Neck: 7 cm x 10 cm soft compressible mass extending from left cheek to neck  Chest: symmetrical rise and fall Cardiac: regular rate and rhythm, +2 bilateral radial pulses, + bilateral pedal pulses Abdomen: soft, non-distended, umbilical hernia present Musculoskeletal: MAE x4 Neuro: A&Ox3} Skin: warm, dry, intact  Assessment/Plan: Ryot Cornelison is a 58 yo male with a 10 year history of left neck mass. Patient scheduled for excision of left cheek mass with Dr. Marla Roe on 09/17/19. Risks, benefits, and alternatives of procedure discussed and questions answered. Patient will also undergo an umbilical hernia repair at the same time,  performed by Dr. Marlou Starks. Post-op medications ordered today. Caprini score=3 and will not require pharmacological prophylaxis.   COVID-19 pre-screening test scheduled for 09/13/19. Post-op visit scheduled for 09/26/19.   The risks that can be encountered with and after excision of a lipoma were discussed and include the following but not limited to these: bleeding, infection, delayed healing, anesthesia risks, skin sensation changes, injury to structures including nerves, blood vessels, and muscles which may be temporary or permanent, allergies to tape, suture materials and glues, blood products, topical preparations or injected agents, skin contour irregularities, skin discoloration and swelling, deep vein thrombosis, cardiac and pulmonary complications, pain,  which may persist, persistent pain, recurrence of the lesion, poor healing of the incision, possible need for revisional surgery or staged procedures.    Electronically signed by: Alfredo Batty, NP 09/02/2019 4:10 PM

## 2019-09-01 NOTE — H&P (View-Only) (Signed)
History of Present Illness: Christian Hubbard is a 58 y.o.  male  with a history of left anterior neck mass.  He presents for preoperative evaluation for upcoming procedure, excision of left cheek lipoma, scheduled for 09/17/19 with Dr. Marla Roe. CT scan read as "palpable complaint reflects a 6 cm x 7 cm subcutaneous simple lipoma." Patient also has an umbilical hernia that will be repaired by Dr. Marlou Starks at the same time. Patient noticed the neck mass about 10 years ago. Patient states it has not changed in size. Patient denies any pain or discomfort at the site. Patient states his sister had a hard neck mass in the same area that was removed. Patient is unsure of the results of his sister's pathology, but states she has a large scar. An incidental finding of "partially covered nodule in the anterior and upper mediastinum" was observed on CT. Patient is scheduled to see Dr. Lucianne Lei Tight with CT surgery.   Patient's medical history is significant for OSA on CPAP, GERD, HTN, HLD, and PNA (2015, 2018).Patient states he was recently diagnosed with Type 2 Diabetes Mellitus and is taking metformin. Past surgical history is significant for bronchoscopy in 2018. Patient denies any adverse reactions to anesthesia. No hx MI, TIA/stroke. Patient quit smoking in 2006. No known allergies.    Past Medical History: Allergies: No Known Allergies  Current Medications:  Current Outpatient Medications:  .  amLODipine (NORVASC) 5 MG tablet, Take 5 mg by mouth daily. , Disp: , Rfl: 0 .  hydrochlorothiazide (HYDRODIURIL) 12.5 MG tablet, Take 12.5 mg by mouth every morning., Disp: , Rfl:  .  losartan (COZAAR) 100 MG tablet, TK 1 T PO QD, Disp: , Rfl:  .  losartan-hydrochlorothiazide (HYZAAR) 100-12.5 MG tablet, Take 1 tablet by mouth daily., Disp: , Rfl: 0 .  metFORMIN (GLUCOPHAGE) 500 MG tablet, TK 1 T PO BID WC, Disp: , Rfl:  .  omeprazole (PRILOSEC) 40 MG capsule, Take 40 mg by mouth daily., Disp: , Rfl:  .   OneTouch Delica Lancets [id number] MISC, , Disp: , Rfl:  .  ONETOUCH VERIO test strip, USE TO CHECK BLOOD SUGARS ONCE DAILY ALTERNATING MORNING AND EVENINGS BEFORE MEALS, Disp: , Rfl:  .  pravastatin (PRAVACHOL) 40 MG tablet, Take 40 mg by mouth daily., Disp: , Rfl:  .  sildenafil (VIAGRA) 100 MG tablet, Take 100 mg by mouth daily as needed for erectile dysfunction., Disp: , Rfl:   Past Medical Problems: Past Medical History:  Diagnosis Date  . GERD (gastroesophageal reflux disease)   . Hyperlipemia   . Hypertension   . OSA on CPAP   . Pneumonia 08/10/2014; 07/10/2017    Past Surgical History: Past Surgical History:  Procedure Laterality Date  . VIDEO BRONCHOSCOPY Bilateral 07/02/2017   Procedure: VIDEO BRONCHOSCOPY WITH FLUORO;  Surgeon: Rigoberto Noel, MD;  Location: [facility];  Service: Cardiopulmonary;  Laterality: Bilateral;    The patient HAS had anesthesia or sedation in the past.   The patient has NOT had problems with anesthesia.  The patient does NOT have a family history of anesthesia problems.    Social History: Social History   Socioeconomic History  . Marital status: Divorced    Spouse name: Not on file  . Number of children: Not on file  . Years of education: Not on file  . Highest education level: Not on file  Occupational History  . Not on file  Social Needs  . Financial resource strain: Not on file  .  Food insecurity    Worry: Not on file    Inability: Not on file  . Transportation needs    Medical: Not on file    Non-medical: Not on file  Tobacco Use  . Smoking status: Former Smoker    Packs/day: 1.00    Years: 24.00    Pack years: 24.00    Types: Cigarettes    Quit date: 11/27/2004    Years since quitting: 14.7  . Smokeless tobacco: Never Used  Substance and Sexual Activity  . Alcohol use: Yes    Alcohol/week: 6.0 standard drinks    Types: 6 Cans of beer per week  . Drug use: No  . Sexual activity: Not Currently  Lifestyle  . Physical activity     Days per week: Not on file    Minutes per session: Not on file  . Stress: Not on file  Relationships  . Social Herbalist on phone: Not on file    Gets together: Not on file    Attends religious service: Not on file    Active member of club or organization: Not on file    Attends meetings of clubs or organizations: Not on file    Relationship status: Not on file  . Intimate partner violence    Fear of current or ex partner: Not on file    Emotionally abused: Not on file    Physically abused: Not on file    Forced sexual activity: Not on file  Other Topics Concern  . Not on file  Social History Narrative  . Not on file    Family History: History reviewed. No pertinent family history.  Review of Systems: General ROS: negative Dermatological ROS: negative Cardiovascular ROS: no chest pain or dyspnea on exertion ENT ROS: negative Gastrointestinal ROS: no abdominal pain, change in bowel habits, or black or bloody stools  Physical Exam: Vital Signs BP 133/87 (BP Location: Left Arm, Patient Position: Sitting, Cuff Size: Large)   Pulse 76   Temp 97.7 F (36.5 C) (Temporal)   Ht 6\' 3"  (1.905 m)   Wt (!) 317 lb 9.6 oz (144.1 kg)   SpO2 97%   BMI 39.70 kg/m  General:alert, appears stated age and no distress HEENT: teeth intact Neck: 7 cm x 10 cm soft compressible mass extending from left cheek to neck  Chest: symmetrical rise and fall Cardiac: regular rate and rhythm, +2 bilateral radial pulses, + bilateral pedal pulses Abdomen: soft, non-distended, umbilical hernia present Musculoskeletal: MAE x4 Neuro: A&Ox3} Skin: warm, dry, intact  Assessment/Plan: Christian Hubbard is a 58 yo male with a 10 year history of left neck mass. Patient scheduled for excision of left cheek mass with Dr. Marla Roe on 09/17/19. Risks, benefits, and alternatives of procedure discussed and questions answered. Patient will also undergo an umbilical hernia repair at the same time,  performed by Dr. Marlou Starks. Post-op medications ordered today. Caprini score=3 and will not require pharmacological prophylaxis.   COVID-19 pre-screening test scheduled for 09/13/19. Post-op visit scheduled for 09/26/19.   The risks that can be encountered with and after excision of a lipoma were discussed and include the following but not limited to these: bleeding, infection, delayed healing, anesthesia risks, skin sensation changes, injury to structures including nerves, blood vessels, and muscles which may be temporary or permanent, allergies to tape, suture materials and glues, blood products, topical preparations or injected agents, skin contour irregularities, skin discoloration and swelling, deep vein thrombosis, cardiac and pulmonary complications, pain,  which may persist, persistent pain, recurrence of the lesion, poor healing of the incision, possible need for revisional surgery or staged procedures.    Electronically signed by: Alfredo Batty, NP 09/02/2019 4:10 PM

## 2019-09-02 ENCOUNTER — Encounter: Payer: Self-pay | Admitting: Nurse Practitioner

## 2019-09-02 ENCOUNTER — Ambulatory Visit (INDEPENDENT_AMBULATORY_CARE_PROVIDER_SITE_OTHER): Payer: 59 | Admitting: Nurse Practitioner

## 2019-09-02 ENCOUNTER — Telehealth (INDEPENDENT_AMBULATORY_CARE_PROVIDER_SITE_OTHER): Payer: Self-pay | Admitting: Nurse Practitioner

## 2019-09-02 ENCOUNTER — Other Ambulatory Visit: Payer: Self-pay

## 2019-09-02 VITALS — BP 133/87 | HR 76 | Temp 97.7°F | Ht 75.0 in | Wt 317.6 lb

## 2019-09-02 DIAGNOSIS — R221 Localized swelling, mass and lump, neck: Secondary | ICD-10-CM

## 2019-09-02 DIAGNOSIS — K429 Umbilical hernia without obstruction or gangrene: Secondary | ICD-10-CM

## 2019-09-02 MED ORDER — HYDROCODONE-ACETAMINOPHEN 5-325 MG PO TABS: 1.0000 | ORAL_TABLET | Freq: Four times a day (QID) | ORAL | 0 refills | Status: AC | PRN

## 2019-09-02 MED ORDER — CEPHALEXIN 500 MG PO CAPS: 500.0000 mg | ORAL_CAPSULE | Freq: Four times a day (QID) | ORAL | 0 refills | Status: AC

## 2019-09-02 MED ORDER — ONDANSETRON HCL 4 MG PO TABS: 4.0000 mg | ORAL_TABLET | Freq: Three times a day (TID) | ORAL | 0 refills | Status: DC | PRN

## 2019-09-02 NOTE — Telephone Encounter (Signed)
I spoke with Mr. Penaflor to inform him that I have ordered his post-op medications. They should be picked up prior to surgery. Mr. Tipton verbalized understanding.

## 2019-09-10 ENCOUNTER — Other Ambulatory Visit: Payer: Self-pay

## 2019-09-12 ENCOUNTER — Other Ambulatory Visit: Payer: Self-pay

## 2019-09-12 ENCOUNTER — Encounter (HOSPITAL_BASED_OUTPATIENT_CLINIC_OR_DEPARTMENT_OTHER)
Admission: RE | Admit: 2019-09-12 | Discharge: 2019-09-12 | Disposition: A | Discharge status: Home / Self Care | Payer: 59 | Source: Ambulatory Visit | Attending: Plastic Surgery | Admitting: Plastic Surgery

## 2019-09-12 DIAGNOSIS — Z01818 Encounter for other preprocedural examination: Secondary | ICD-10-CM | POA: Insufficient documentation

## 2019-09-12 DIAGNOSIS — R221 Localized swelling, mass and lump, neck: Secondary | ICD-10-CM | POA: Insufficient documentation

## 2019-09-12 DIAGNOSIS — K429 Umbilical hernia without obstruction or gangrene: Secondary | ICD-10-CM | POA: Diagnosis not present

## 2019-09-12 LAB — BASIC METABOLIC PANEL
Anion gap: 10 (ref 5–15)
BUN: 13 mg/dL (ref 6–20)
CO2: 23 mmol/L (ref 22–32)
Calcium: 8.9 mg/dL (ref 8.9–10.3)
Chloride: 104 mmol/L (ref 98–111)
Creatinine, Ser: 0.99 mg/dL (ref 0.61–1.24)
GFR calc Af Amer: >60 mL/min (ref >60)
GFR calc non Af Amer: >60 mL/min (ref >60)
Glucose, Bld: 124 mg/dL — ABNORMAL HIGH (ref 70–99)
Potassium: 4.2 mmol/L (ref 3.5–5.1)
Sodium: 137 mmol/L (ref 135–145)

## 2019-09-12 NOTE — Progress Notes (Signed)
Surgical soap given with instructions, pt verbalized understanding.  

## 2019-09-13 ENCOUNTER — Other Ambulatory Visit (HOSPITAL_COMMUNITY)
Admission: RE | Admit: 2019-09-13 | Discharge: 2019-09-13 | Disposition: A | Discharge status: Home / Self Care | Payer: 59 | Source: Ambulatory Visit | Attending: Plastic Surgery | Admitting: Plastic Surgery

## 2019-09-13 DIAGNOSIS — Z01812 Encounter for preprocedural laboratory examination: Secondary | ICD-10-CM | POA: Insufficient documentation

## 2019-09-13 DIAGNOSIS — Z20828 Contact with and (suspected) exposure to other viral communicable diseases: Secondary | ICD-10-CM | POA: Insufficient documentation

## 2019-09-14 LAB — NOVEL CORONAVIRUS, NAA (HOSP ORDER, SEND-OUT TO REF LAB; TAT 18-24 HRS): SARS-CoV-2, NAA: NOT DETECTED

## 2019-09-17 ENCOUNTER — Other Ambulatory Visit: Payer: Self-pay

## 2019-09-17 ENCOUNTER — Ambulatory Visit (HOSPITAL_BASED_OUTPATIENT_CLINIC_OR_DEPARTMENT_OTHER)
Admission: RE | Admit: 2019-09-17 | Discharge: 2019-09-17 | Disposition: A | Discharge status: Home / Self Care | Payer: 59 | Attending: Plastic Surgery | Admitting: Plastic Surgery

## 2019-09-17 ENCOUNTER — Ambulatory Visit (HOSPITAL_BASED_OUTPATIENT_CLINIC_OR_DEPARTMENT_OTHER): Payer: 59 | Admitting: Anesthesiology

## 2019-09-17 ENCOUNTER — Encounter (HOSPITAL_BASED_OUTPATIENT_CLINIC_OR_DEPARTMENT_OTHER): Payer: Self-pay | Admitting: *Deleted

## 2019-09-17 ENCOUNTER — Encounter (HOSPITAL_BASED_OUTPATIENT_CLINIC_OR_DEPARTMENT_OTHER)
Admission: RE | Disposition: A | Discharge status: Home / Self Care | Payer: Self-pay | Source: Home / Self Care | Attending: Plastic Surgery

## 2019-09-17 DIAGNOSIS — I10 Essential (primary) hypertension: Secondary | ICD-10-CM | POA: Diagnosis not present

## 2019-09-17 DIAGNOSIS — Z7984 Long term (current) use of oral hypoglycemic drugs: Secondary | ICD-10-CM | POA: Diagnosis not present

## 2019-09-17 DIAGNOSIS — Z6841 Body Mass Index (BMI) 40.0 and over, adult: Secondary | ICD-10-CM | POA: Diagnosis not present

## 2019-09-17 DIAGNOSIS — D170 Benign lipomatous neoplasm of skin and subcutaneous tissue of head, face and neck: Secondary | ICD-10-CM | POA: Insufficient documentation

## 2019-09-17 DIAGNOSIS — E785 Hyperlipidemia, unspecified: Secondary | ICD-10-CM | POA: Diagnosis not present

## 2019-09-17 DIAGNOSIS — K429 Umbilical hernia without obstruction or gangrene: Secondary | ICD-10-CM | POA: Diagnosis not present

## 2019-09-17 DIAGNOSIS — E7800 Pure hypercholesterolemia, unspecified: Secondary | ICD-10-CM | POA: Insufficient documentation

## 2019-09-17 DIAGNOSIS — Z79899 Other long term (current) drug therapy: Secondary | ICD-10-CM | POA: Insufficient documentation

## 2019-09-17 DIAGNOSIS — G4733 Obstructive sleep apnea (adult) (pediatric): Secondary | ICD-10-CM | POA: Diagnosis not present

## 2019-09-17 DIAGNOSIS — Z8249 Family history of ischemic heart disease and other diseases of the circulatory system: Secondary | ICD-10-CM | POA: Diagnosis not present

## 2019-09-17 DIAGNOSIS — E119 Type 2 diabetes mellitus without complications: Secondary | ICD-10-CM | POA: Insufficient documentation

## 2019-09-17 DIAGNOSIS — Z87891 Personal history of nicotine dependence: Secondary | ICD-10-CM | POA: Insufficient documentation

## 2019-09-17 DIAGNOSIS — K219 Gastro-esophageal reflux disease without esophagitis: Secondary | ICD-10-CM | POA: Diagnosis not present

## 2019-09-17 HISTORY — PX: LIPOMA EXCISION: SHX5283

## 2019-09-17 HISTORY — PX: UMBILICAL HERNIA REPAIR: SHX196

## 2019-09-17 LAB — GLUCOSE, CAPILLARY: Glucose-Capillary: 114 mg/dL — ABNORMAL HIGH (ref 70–99)

## 2019-09-17 SURGERY — EXCISION LIPOMA
Anesthesia: General | Site: Face

## 2019-09-17 MED ORDER — ROCURONIUM BROMIDE 100 MG/10ML IV SOLN
INTRAVENOUS | Status: DC | PRN
  Administered 2019-09-17: 100 mg via INTRAVENOUS

## 2019-09-17 MED ORDER — SODIUM CHLORIDE 0.9% FLUSH: 3.0000 mL | Freq: Two times a day (BID) | INTRAVENOUS | Status: DC

## 2019-09-17 MED ORDER — HYDROCODONE-ACETAMINOPHEN 5-325 MG PO TABS: 1.0000 | ORAL_TABLET | Freq: Four times a day (QID) | ORAL | 0 refills | Status: DC | PRN

## 2019-09-17 MED ORDER — SUFENTANIL CITRATE 50 MCG/ML IV SOLN
INTRAVENOUS | Status: DC | PRN
  Administered 2019-09-17 (×2): 10 ug via INTRAVENOUS

## 2019-09-17 MED ORDER — GABAPENTIN 300 MG PO CAPS
ORAL_CAPSULE | ORAL | Status: AC
  Filled 2019-09-17: qty 1

## 2019-09-17 MED ORDER — MIDAZOLAM HCL 5 MG/5ML IJ SOLN
INTRAMUSCULAR | Status: DC | PRN
  Administered 2019-09-17: 2 mg via INTRAVENOUS

## 2019-09-17 MED ORDER — LIDOCAINE HCL (CARDIAC) PF 100 MG/5ML IV SOSY
PREFILLED_SYRINGE | INTRAVENOUS | Status: DC | PRN
  Administered 2019-09-17: 100 mg via INTRAVENOUS

## 2019-09-17 MED ORDER — CELECOXIB 200 MG PO CAPS
200.0000 mg | ORAL_CAPSULE | ORAL | Status: AC
  Administered 2019-09-17: 200 mg via ORAL

## 2019-09-17 MED ORDER — ACETAMINOPHEN 500 MG PO TABS
ORAL_TABLET | ORAL | Status: AC
  Filled 2019-09-17: qty 2

## 2019-09-17 MED ORDER — MEPERIDINE HCL 25 MG/ML IJ SOLN: 6.2500 mg | INTRAMUSCULAR | Status: DC | PRN

## 2019-09-17 MED ORDER — SODIUM CHLORIDE 0.9% FLUSH: 3.0000 mL | INTRAVENOUS | Status: DC | PRN

## 2019-09-17 MED ORDER — MIDAZOLAM HCL 2 MG/2ML IJ SOLN
INTRAMUSCULAR | Status: AC
  Filled 2019-09-17: qty 2

## 2019-09-17 MED ORDER — LIDOCAINE-EPINEPHRINE 1 %-1:100000 IJ SOLN
INTRAMUSCULAR | Status: DC | PRN
  Administered 2019-09-17: 10 mL

## 2019-09-17 MED ORDER — CELECOXIB 200 MG PO CAPS
ORAL_CAPSULE | ORAL | Status: AC
  Filled 2019-09-17: qty 1

## 2019-09-17 MED ORDER — ONDANSETRON HCL 4 MG/2ML IJ SOLN
INTRAMUSCULAR | Status: DC | PRN
  Administered 2019-09-17: 4 mg via INTRAVENOUS

## 2019-09-17 MED ORDER — CHLORHEXIDINE GLUCONATE 4 % EX LIQD: 60.0000 mL | Freq: Once | CUTANEOUS | Status: DC

## 2019-09-17 MED ORDER — FENTANYL CITRATE (PF) 100 MCG/2ML IJ SOLN
25.0000 ug | INTRAMUSCULAR | Status: DC | PRN
  Administered 2019-09-17: 50 ug via INTRAVENOUS

## 2019-09-17 MED ORDER — ACETAMINOPHEN 500 MG PO TABS
1000.0000 mg | ORAL_TABLET | ORAL | Status: AC
  Administered 2019-09-17: 1000 mg via ORAL

## 2019-09-17 MED ORDER — DIPHENHYDRAMINE HCL 50 MG/ML IJ SOLN
INTRAMUSCULAR | Status: DC | PRN
  Administered 2019-09-17: 12.5 mg via INTRAVENOUS

## 2019-09-17 MED ORDER — SUFENTANIL CITRATE 50 MCG/ML IV SOLN
INTRAVENOUS | Status: AC
  Filled 2019-09-17: qty 1

## 2019-09-17 MED ORDER — BUPIVACAINE HCL (PF) 0.25 % IJ SOLN
INTRAMUSCULAR | Status: DC | PRN
  Administered 2019-09-17: 10 mL

## 2019-09-17 MED ORDER — ACETAMINOPHEN 325 MG PO TABS: 650.0000 mg | ORAL_TABLET | ORAL | Status: DC | PRN

## 2019-09-17 MED ORDER — SODIUM CHLORIDE 0.9 % IV SOLN: 250.0000 mL | INTRAVENOUS | Status: DC | PRN

## 2019-09-17 MED ORDER — CHLORHEXIDINE GLUCONATE CLOTH 2 % EX PADS: 6.0000 | MEDICATED_PAD | Freq: Once | CUTANEOUS | Status: DC

## 2019-09-17 MED ORDER — OXYCODONE HCL 5 MG PO TABS: 5.0000 mg | ORAL_TABLET | ORAL | Status: DC | PRN

## 2019-09-17 MED ORDER — GABAPENTIN 300 MG PO CAPS
300.0000 mg | ORAL_CAPSULE | ORAL | Status: AC
  Administered 2019-09-17: 10:00:00 300 mg via ORAL

## 2019-09-17 MED ORDER — PROPOFOL 10 MG/ML IV BOLUS
INTRAVENOUS | Status: DC | PRN
  Administered 2019-09-17: 300 mg via INTRAVENOUS

## 2019-09-17 MED ORDER — METOCLOPRAMIDE HCL 5 MG/ML IJ SOLN: 10.0000 mg | Freq: Once | INTRAMUSCULAR | Status: DC | PRN

## 2019-09-17 MED ORDER — DEXAMETHASONE SODIUM PHOSPHATE 4 MG/ML IJ SOLN
INTRAMUSCULAR | Status: DC | PRN
  Administered 2019-09-17: 10 mg via INTRAVENOUS

## 2019-09-17 MED ORDER — CEFAZOLIN SODIUM-DEXTROSE 2-4 GM/100ML-% IV SOLN
INTRAVENOUS | Status: AC
  Filled 2019-09-17: qty 100

## 2019-09-17 MED ORDER — DEXTROSE 5 % IV SOLN
3.0000 g | INTRAVENOUS | Status: AC
  Administered 2019-09-17: 11:00:00 3 g via INTRAVENOUS

## 2019-09-17 MED ORDER — LACTATED RINGERS IV SOLN
INTRAVENOUS | Status: DC
  Administered 2019-09-17 (×2): via INTRAVENOUS

## 2019-09-17 MED ORDER — FENTANYL CITRATE (PF) 100 MCG/2ML IJ SOLN
INTRAMUSCULAR | Status: AC
  Filled 2019-09-17: qty 2

## 2019-09-17 MED ORDER — LACTATED RINGERS IV SOLN: INTRAVENOUS | Status: DC

## 2019-09-17 MED ORDER — PROPOFOL 10 MG/ML IV BOLUS
INTRAVENOUS | Status: AC
  Filled 2019-09-17: qty 20

## 2019-09-17 MED ORDER — ACETAMINOPHEN 650 MG RE SUPP: 650.0000 mg | RECTAL | Status: DC | PRN

## 2019-09-17 MED ORDER — SUGAMMADEX SODIUM 500 MG/5ML IV SOLN
INTRAVENOUS | Status: DC | PRN
  Administered 2019-09-17: 350 mg via INTRAVENOUS

## 2019-09-17 MED ORDER — CEFAZOLIN SODIUM-DEXTROSE 1-4 GM/50ML-% IV SOLN
INTRAVENOUS | Status: AC
  Filled 2019-09-17: qty 50

## 2019-09-17 SURGICAL SUPPLY — 68 items
BENZOIN TINCTURE PRP APPL 2/3 (GAUZE/BANDAGES/DRESSINGS) IMPLANT
BLADE CLIPPER SURG (BLADE) ×1 IMPLANT
BLADE HEX COATED 2.75 (ELECTRODE) ×1 IMPLANT
BLADE SURG 15 STRL LF DISP TIS (BLADE) ×4 IMPLANT
BLADE SURG 15 STRL SS (BLADE) ×2
CANISTER SUCT 1200ML W/VALVE (MISCELLANEOUS) IMPLANT
CHLORAPREP W/TINT 26 (MISCELLANEOUS) ×4 IMPLANT
COVER BACK TABLE REUSABLE LG (DRAPES) ×5 IMPLANT
COVER MAYO STAND REUSABLE (DRAPES) ×6 IMPLANT
COVER WAND RF STERILE (DRAPES) IMPLANT
DECANTER SPIKE VIAL GLASS SM (MISCELLANEOUS) ×3 IMPLANT
DERMABOND ADVANCED (GAUZE/BANDAGES/DRESSINGS) ×2
DERMABOND ADVANCED .7 DNX12 (GAUZE/BANDAGES/DRESSINGS) ×2 IMPLANT
DRAPE LAPAROTOMY 100X72 PEDS (DRAPES) ×1 IMPLANT
DRAPE LAPAROTOMY TRNSV 102X78 (DRAPES) ×2 IMPLANT
DRAPE U-SHAPE 76X120 STRL (DRAPES) ×1 IMPLANT
DRAPE UTILITY XL STRL (DRAPES) ×3 IMPLANT
DRSG TEGADERM 4X4.75 (GAUZE/BANDAGES/DRESSINGS) IMPLANT
ELECT COATED BLADE 2.86 ST (ELECTRODE) ×3 IMPLANT
ELECT REM PT RETURN 9FT ADLT (ELECTROSURGICAL) ×6
ELECTRODE REM PT RTRN 9FT ADLT (ELECTROSURGICAL) ×2 IMPLANT
GAUZE SPONGE 4X4 12PLY STRL LF (GAUZE/BANDAGES/DRESSINGS) IMPLANT
GLOVE BIO SURGEON STRL SZ 6.5 (GLOVE) ×4 IMPLANT
GLOVE BIO SURGEON STRL SZ7 (GLOVE) ×2 IMPLANT
GLOVE BIO SURGEON STRL SZ7.5 (GLOVE) ×3 IMPLANT
GOWN STRL REUS W/ TWL LRG LVL3 (GOWN DISPOSABLE) ×6 IMPLANT
GOWN STRL REUS W/ TWL XL LVL3 (GOWN DISPOSABLE) IMPLANT
GOWN STRL REUS W/TWL LRG LVL3 (GOWN DISPOSABLE) ×4
GOWN STRL REUS W/TWL XL LVL3 (GOWN DISPOSABLE) ×1
MESH VENTRALEX ST 1-7/10 CRC S (Mesh General) ×1 IMPLANT
NDL HYPO 25X1 1.5 SAFETY (NEEDLE) ×2 IMPLANT
NDL PRECISIONGLIDE 27X1.5 (NEEDLE) IMPLANT
NEEDLE HYPO 22GX1.5 SAFETY (NEEDLE) ×1 IMPLANT
NEEDLE HYPO 25X1 1.5 SAFETY (NEEDLE) ×3 IMPLANT
NEEDLE PRECISIONGLIDE 27X1.5 (NEEDLE) ×3 IMPLANT
NS IRRIG 1000ML POUR BTL (IV SOLUTION) ×1 IMPLANT
PACK BASIN DAY SURGERY FS (CUSTOM PROCEDURE TRAY) ×5 IMPLANT
PENCIL BUTTON HOLSTER BLD 10FT (ELECTRODE) ×4 IMPLANT
SLEEVE SCD COMPRESS KNEE MED (MISCELLANEOUS) IMPLANT
SPONGE GAUZE 2X2 8PLY STRL LF (GAUZE/BANDAGES/DRESSINGS) IMPLANT
SPONGE LAP 18X18 RF (DISPOSABLE) ×3 IMPLANT
SPONGE LAP 4X18 RFD (DISPOSABLE) ×2 IMPLANT
STRIP CLOSURE SKIN 1/2X4 (GAUZE/BANDAGES/DRESSINGS) IMPLANT
SUCTION FRAZIER HANDLE 10FR (MISCELLANEOUS)
SUCTION TUBE FRAZIER 10FR DISP (MISCELLANEOUS) IMPLANT
SUT ETHILON 4 0 PS 2 18 (SUTURE) IMPLANT
SUT MNCRL AB 4-0 PS2 18 (SUTURE) ×1 IMPLANT
SUT MON AB 3-0 SH 27 (SUTURE)
SUT MON AB 3-0 SH27 (SUTURE) IMPLANT
SUT MON AB 4-0 PC3 18 (SUTURE) ×3 IMPLANT
SUT MON AB 5-0 PS2 18 (SUTURE) ×3 IMPLANT
SUT NOVA NAB DX-16 0-1 5-0 T12 (SUTURE) ×1 IMPLANT
SUT NOVA NAB GS-21 1 T12 (SUTURE) ×3 IMPLANT
SUT SILK 2 0 TIES 17X18 (SUTURE) ×1
SUT SILK 2-0 18XBRD TIE BLK (SUTURE) IMPLANT
SUT SILK 3 0 SH 30 (SUTURE) IMPLANT
SUT VIC AB 2-0 SH 27 (SUTURE) ×1
SUT VIC AB 2-0 SH 27XBRD (SUTURE) ×2 IMPLANT
SUT VIC AB 3-0 54X BRD REEL (SUTURE) IMPLANT
SUT VIC AB 3-0 BRD 54 (SUTURE)
SUT VIC AB 3-0 SH 27 (SUTURE) ×1
SUT VIC AB 3-0 SH 27X BRD (SUTURE) IMPLANT
SYR BULB 3OZ (MISCELLANEOUS) IMPLANT
SYR CONTROL 10ML LL (SYRINGE) ×6 IMPLANT
TOWEL GREEN STERILE FF (TOWEL DISPOSABLE) ×8 IMPLANT
TRAY DSU PREP LF (CUSTOM PROCEDURE TRAY) ×2 IMPLANT
TUBE CONNECTING 20X1/4 (TUBING) IMPLANT
YANKAUER SUCT BULB TIP NO VENT (SUCTIONS) IMPLANT

## 2019-09-17 NOTE — Anesthesia Preprocedure Evaluation (Signed)
Anesthesia Evaluation  Patient identified by MRN, date of birth, ID band Patient awake    Reviewed: Allergy & Precautions, NPO status , Patient's Chart, lab work & pertinent test results  Airway Mallampati: II  TM Distance: >3 FB Neck ROM: Full    Dental no notable dental hx.    Pulmonary sleep apnea and Continuous Positive Airway Pressure Ventilation , former smoker,    Pulmonary exam normal breath sounds clear to auscultation       Cardiovascular hypertension, Pt. on medications Normal cardiovascular exam Rhythm:Regular Rate:Normal     Neuro/Psych negative neurological ROS  negative psych ROS   GI/Hepatic Neg liver ROS, GERD  Controlled,  Endo/Other  diabetes, Type 2Morbid obesity  Renal/GU negative Renal ROS  negative genitourinary   Musculoskeletal negative musculoskeletal ROS (+)   Abdominal   Peds negative pediatric ROS (+)  Hematology negative hematology ROS (+)   Anesthesia Other Findings   Reproductive/Obstetrics negative OB ROS                             Anesthesia Physical Anesthesia Plan  ASA: III  Anesthesia Plan: General   Post-op Pain Management:    Induction: Intravenous  PONV Risk Score and Plan: 2 and Ondansetron, Treatment may vary due to age or medical condition and Midazolam  Airway Management Planned: Oral ETT  Additional Equipment:   Intra-op Plan:   Post-operative Plan: Extubation in OR  Informed Consent: I have reviewed the patients History and Physical, chart, labs and discussed the procedure including the risks, benefits and alternatives for the proposed anesthesia with the patient or authorized representative who has indicated his/her understanding and acceptance.     Dental advisory given  Plan Discussed with: CRNA  Anesthesia Plan Comments:         Anesthesia Quick Evaluation

## 2019-09-17 NOTE — Transfer of Care (Signed)
Immediate Anesthesia Transfer of Care Note  Patient: Christian Hubbard  Procedure(s) Performed: Excision of Left cheek lipoma (Left Face) UMBILICAL HERNIA REPAIR WITH MESH (N/A Abdomen)  Patient Location: PACU  Anesthesia Type:General  Level of Consciousness: awake, alert , oriented and drowsy  Airway & Oxygen Therapy: Patient Spontanous Breathing and Patient connected to face mask oxygen  Post-op Assessment: Report given to RN and Post -op Vital signs reviewed and stable  Post vital signs: Reviewed and stable  Last Vitals:  Vitals Value Taken Time  BP    Temp    Pulse    Resp    SpO2      Last Pain:  Vitals:   09/17/19 0951  TempSrc: Oral  PainSc: 0-No pain      Patients Stated Pain Goal: 3 ([id number])  Complications: No apparent anesthesia complications

## 2019-09-17 NOTE — H&P (Signed)
[name]  Location: [facility] Patient #: [id number] DOB: October 13, 1961 Single / Language: Christian Hubbard / Race: Black or African American Male   History of Present Illness  The patient is a 58 year old male who presents with an umbilical hernia. We are asked to see the patient in consultation by Dr. Marla Roe to evaluate him for an umbilical hernia. The patient is a 58 year old black male who is scheduled to have a mass removed from his left neck on September 14. During his preoperative visit he was found to have a moderate-sized bulge at his belly button. He denies any significant pain associated with it. He denies any nausea or vomiting. He denies any fevers or chills. His appetite is good and his bowels are working normally.   Past Surgical History  Colon Polyp Removal - Colonoscopy   Diagnostic Studies History  Colonoscopy  1-5 years ago  Allergies  No Known Allergies   Medication History  amLODIPine Besylate (5MG  Tablet, Oral) Active. hydroCHLOROthiazide (12.5MG  Tablet, Oral) Active. Losartan Potassium (100MG  Tablet, Oral) Active. metFORMIN HCl (500MG  Tablet, Oral) Active. Omeprazole (40MG  Capsule DR, Oral) Active. Pravastatin Sodium (40MG  Tablet, Oral) Active. Medications Reconciled  Social History  Caffeine use  Carbonated beverages.  Family History Breast Cancer  Sister. Hypertension  Father.  Other Problems  Gastroesophageal Reflux Disease  High blood pressure  Hypercholesterolemia     Review of Systems  General Not Present- Appetite Loss, Chills, Fatigue, Fever, Night Sweats, Weight Gain and Weight Loss. Note: All other systems negative (unless as noted in HPI & included Review of Systems) Skin Not Present- Change in Wart/Mole, Dryness, Hives, Jaundice, New Lesions, Non-Healing Wounds, Rash and Ulcer. HEENT Not Present- Earache, Hearing Loss, Hoarseness, Nose Bleed, Oral Ulcers, Ringing in the Ears, Seasonal Allergies,  Sinus Pain, Sore Throat, Visual Disturbances, Wears glasses/contact lenses and Yellow Eyes. Respiratory Not Present- Bloody sputum, Chronic Cough, Difficulty Breathing, Snoring and Wheezing. Breast Not Present- Breast Mass, Breast Pain, Nipple Discharge and Skin Changes. Cardiovascular Not Present- Chest Pain, Difficulty Breathing Lying Down, Leg Cramps, Palpitations, Rapid Heart Rate, Shortness of Breath and Swelling of Extremities. Gastrointestinal Not Present- Abdominal Pain, Bloating, Bloody Stool, Change in Bowel Habits, Chronic diarrhea, Constipation, Difficulty Swallowing, Excessive gas, Gets full quickly at meals, Hemorrhoids, Indigestion, Nausea, Rectal Pain and Vomiting. Male Genitourinary Not Present- Blood in Urine, Change in Urinary Stream, Frequency, Impotence, Nocturia, Painful Urination, Urgency and Urine Leakage. Musculoskeletal Not Present- Back Pain, Joint Pain, Joint Stiffness, Muscle Pain, Muscle Weakness and Swelling of Extremities. Neurological Not Present- Decreased Memory, Fainting, Headaches, Numbness, Seizures, Tingling, Tremor, Trouble walking and Weakness. Psychiatric Not Present- Anxiety, Bipolar, Change in Sleep Pattern, Depression, Fearful and Frequent crying. Endocrine Not Present- Cold Intolerance, Excessive Hunger, Hair Changes, Heat Intolerance and New Diabetes. Hematology Not Present- Blood Thinners, Easy Bruising, Excessive bleeding, Gland problems, HIV and Persistent Infections.  Vitals Weight: 322.2 lb Height: 74in Body Surface Area: 2.66 m Body Mass Index: 41.37 kg/m  Temp.: 97.88F(Temporal)  Pulse: 97 (Regular)  BP: 144/86 (Sitting, Left Arm, Standard)       Physical Exam  General Mental Status-Alert. General Appearance-Consistent with stated age. Hydration-Well hydrated. Voice-Normal.  Head and Neck Head-normocephalic, atraumatic with no lesions or palpable masses. Trachea-midline. Thyroid Gland Characteristics -  normal size and consistency.  Eye Eyeball - Bilateral-Extraocular movements intact. Sclera/Conjunctiva - Bilateral-No scleral icterus.  Chest and Lung Exam Chest and lung exam reveals -quiet, even and easy respiratory effort with no use of accessory muscles and on auscultation,  normal breath sounds, no adventitious sounds and normal vocal resonance. Inspection Chest Wall - Normal. Back - normal.  Cardiovascular Cardiovascular examination reveals -normal heart sounds, regular rate and rhythm with no murmurs and normal pedal pulses bilaterally.  Abdomen Note: The abdomen is soft and nontender. There is a moderate bulge at the umbilicus that partially reduces. It is likely to have some incarcerated omentum   Neurologic Neurologic evaluation reveals -alert and oriented x 3 with no impairment of recent or remote memory. Mental Status-Normal.  Musculoskeletal Normal Exam - Left-Upper Extremity Strength Normal and Lower Extremity Strength Normal. Normal Exam - Right-Upper Extremity Strength Normal and Lower Extremity Strength Normal.  Lymphatic Head & Neck  General Head & Neck Lymphatics: Bilateral - Description - Normal. Axillary  General Axillary Region: Bilateral - Description - Normal. Tenderness - Non Tender. Femoral & Inguinal  Generalized Femoral & Inguinal Lymphatics: Bilateral - Description - Normal. Tenderness - Non Tender.    Assessment & Plan  UMBILICAL HERNIA WITHOUT OBSTRUCTION OR GANGRENE (K42.9) Impression: The patient has a moderate umbilical hernia. Because of the risk of incarceration and strangulation and think he would benefit from having this fixed. He would also like to have this done. I have discussed with him in detail the risks and benefits of the operation to fix the hernia as well as some of the technical aspects including the use of mesh and he understands and wishes to proceed. We will try to coordinate this with Dr. Marla Roe for  September 14.

## 2019-09-17 NOTE — Op Note (Signed)
DATE OF OPERATION: 09/17/2019  LOCATION: [facility]  PREOPERATIVE DIAGNOSIS: left cheek lipoma  POSTOPERATIVE DIAGNOSIS: Same  PROCEDURE: Excision of left cheek lipoma 4 x 5 cm  SURGEON: Tawanna Funk Sanger Aldric Wenzler, DO  ASSISTANT:  Roetta Sessions, PA and Greenleaf Cox, RNFA  EBL: 5 cc  CONDITION: Stable  COMPLICATIONS: None  INDICATION: The patient, Christian Hubbard, is a 58 y.o. male born on October 01, 1961, is here for treatment of a left cheek lipoma / mass.   PROCEDURE DETAILS:  The patient was seen prior to surgery and marked.  The IV antibiotics were given. The patient was taken to the operating room and given a general anesthetic. A standard time out was performed and all information was confirmed by those in the room. SCDs were placed.   The patient was prepped and draped.  The case was done in conjunction with general surgery for a hernia repair.  The local was injected at the subcuticular layer of the left cheek.  The #15 blade was used to make an incision 4 cm long.  The tissue scissors and small hemostat was used to dissect along the sternocleidomastoid and subcuticular layer and release the 4 x 5 cm mass / lipoma from the surrounding tissue.  Hemostasis was achieved with electrocautery.  The deep layer was closed with the 4-0 Monocryl followed by a running subcuticular 5-0 Monocryl.  Derma bond was applied.  A pressure dressing was placed.  The patient was allowed to wake up and taken to recovery room in stable condition at the end of the case. The family was notified at the end of the case.   The advanced practice practitioner (APP) assisted throughout the case.  The APP was essential in retraction and counter traction when needed to make the case progress smoothly.  This retraction and assistance made it possible to see the tissue plans for the procedure.  The assistance was needed for blood control, tissue re-approximation and assisted with closure of the incision  site.

## 2019-09-17 NOTE — Anesthesia Procedure Notes (Signed)
Procedure Name: Intubation Date/Time: 09/17/2019 10:47 AM Performed by: Willa Frater, CRNA Pre-anesthesia Checklist: Patient identified, Emergency Drugs available, Suction available and Patient being monitored Patient Re-evaluated:Patient Re-evaluated prior to induction Oxygen Delivery Method: Circle system utilized Preoxygenation: Pre-oxygenation with 100% oxygen Induction Type: IV induction Ventilation: Mask ventilation without difficulty and Oral airway inserted - appropriate to patient size Laryngoscope Size: Glidescope Grade View: Grade II Tube type: Oral Tube size: 8.0 mm Number of attempts: 1 Airway Equipment and Method: Stylet,  Oral airway and Video-laryngoscopy Placement Confirmation: ETT inserted through vocal cords under direct vision,  positive ETCO2 and breath sounds checked- equal and bilateral Secured at: 22 cm Tube secured with: Tape Dental Injury: Teeth and Oropharynx as per pre-operative assessment

## 2019-09-17 NOTE — Interval H&P Note (Signed)
History and Physical Interval Note:  09/17/2019 10:17 AM  Christian Hubbard  has presented today for surgery, with the diagnosis of mass or lump of neck.  The various methods of treatment have been discussed with the patient and family. After consideration of risks, benefits and other options for treatment, the patient has consented to  Procedure(s): Excision of Left cheek lipoma (Left) UMBILICAL HERNIA REPAIR WITH MESH (N/A) as a surgical intervention.  The patient's history has been reviewed, patient examined, no change in status, stable for surgery.  I have reviewed the patient's chart and labs.  Questions were answered to the patient's satisfaction.     Loel Lofty Aricka Goldberger

## 2019-09-17 NOTE — Discharge Instructions (Signed)
INSTRUCTIONS FOR AFTER SURGERY   You are having surgery.  You will likely have some questions about what to expect following your operation.  The following information will help you and your family understand what to expect when you are discharged from the hospital.  Following these guidelines will help ensure a smooth recovery and reduce risks of complications.   Postoperative instructions include information on: diet, wound care, medications and physical activity.  AFTER SURGERY Expect to go home after the procedure.  In some cases, you may need to spend one night in the hospital for observation.  DIET This surgery does not require a specific diet.  However, I have to mention that the healthier you eat the better your body can start healing. It is important to increasing your protein intake.  This means limiting the foods with sugar and carbohydrates.  Focus on vegetables and some meat.  If you have any liposuction during your procedure be sure to drink water.  If your urine is bright yellow, then it is concentrated, and you need to drink more water.  As a general rule after surgery, you should have 8 ounces of water every hour while awake.  If you find you are persistently nauseated or unable to take in liquids let us know.  NO TOBACCO USE or EXPOSURE.  This will slow your healing process and increase the risk of a wound.  WOUND CARE If you don't have a drain: You can shower 2 days after surgery.  Use fragrance free soap.  Dial, [place] and [place] are usually mild on the skin.   If you have steri-strips / tape directly attached to your skin leave them in place. It is OK to get these wet.  No baths, pools or hot tubs for two weeks. We close your incision to leave the smallest and best-looking scar. No ointment or creams on your incisions until given the go ahead.  Especially not Neosporin (Too many skin reactions with this one).  A few weeks after surgery you can use Mederma and start massaging the  scar.  ACTIVITY No heavy lifting until cleared by the doctor.  It is OK to walk and climb stairs. In fact, moving your legs is very important to decrease your risk of a blood clot.  It will also help keep you from getting deconditioned.  Every 1 to 2 hours get up and walk for 5 minutes. This will help with a quicker recovery back to normal.  Let pain be your guide so you don't do too much.  NO, you cannot do the spring cleaning and don't plan on taking care of anyone else.  This is your time for TLC.  You will be more comfortable if you sleep and rest with your head elevated either with a few pillows under you or in a recliner.  No stomach sleeping for a few months.  WORK Everyone returns to work at different times. As a rough guide, most people take at least 1 - 2 weeks off prior to returning to work. If you need documentation for your job, bring the forms to your postoperative follow up visit.  DRIVING Arrange for someone to bring you home from the hospital.  You may be able to drive a few days after surgery but not while taking any narcotics or valium.  BOWEL MOVEMENTS Constipation can occur after anesthesia and while taking pain medication.  It is important to stay ahead for your comfort.  We recommend taking Milk of Magnesia (  2 tablespoons; twice a day) while taking the pain pills. Mylanta is also good.  SEROMA This is fluid your body tried to put in the surgical site.  This is normal but if it creates tight skinny skin let us know.  It usually decreases in a few weeks.  WHEN TO CALL Call your surgeon's office if any of the following occur:  Fever 101 degrees F or greater  Excessive bleeding or fluid from the incision site.  Pain that increases over time without aid from the medications  Redness, warmth, or pus draining from incision sites  Persistent nausea or inability to take in liquids  Severe misshapen area that underwent the operation.     Post Anesthesia Home Care  Instructions  Activity: Get plenty of rest for the remainder of the day. A responsible individual must stay with you for 24 hours following the procedure.  For the next 24 hours, DO NOT: -Drive a car -Paediatric nurse -Drink alcoholic beverages -Take any medication unless instructed by your physician -Make any legal decisions or sign important papers.  Meals: Start with liquid foods such as gelatin or soup. Progress to regular foods as tolerated. Avoid greasy, spicy, heavy foods. If nausea and/or vomiting occur, drink only clear liquids until the nausea and/or vomiting subsides. Call your physician if vomiting continues.  Special Instructions/Symptoms: Your throat may feel dry or sore from the anesthesia or the breathing tube placed in your throat during surgery. If this causes discomfort, gargle with warm salt water. The discomfort should disappear within 24 hours.  If you had a scopolamine patch placed behind your ear for the management of post- operative nausea and/or vomiting:  1. The medication in the patch is effective for 72 hours, after which it should be removed.  Wrap patch in a tissue and discard in the trash. Wash hands thoroughly with soap and water. 2. You may remove the patch earlier than 72 hours if you experience unpleasant side effects which may include dry mouth, dizziness or visual disturbances. 3. Avoid touching the patch. Wash your hands with soap and water after contact with the patch.   No tylenol until after 4pm today and no ibuprofen until after 7pm tonight.

## 2019-09-17 NOTE — Anesthesia Postprocedure Evaluation (Signed)
Anesthesia Post Note  Patient: Christian Hubbard  Procedure(s) Performed: Excision of Left cheek lipoma (Left Face) UMBILICAL HERNIA REPAIR WITH MESH (N/A Abdomen)     Patient location during evaluation: PACU Anesthesia Type: General Level of consciousness: awake and alert Pain management: pain level controlled Vital Signs Assessment: post-procedure vital signs reviewed and stable Respiratory status: spontaneous breathing, nonlabored ventilation, respiratory function stable and patient connected to nasal cannula oxygen Cardiovascular status: blood pressure returned to baseline and stable Postop Assessment: no apparent nausea or vomiting Anesthetic complications: no    Last Vitals:  Vitals:   09/17/19 1300 09/17/19 1337  BP: 139/85 (!) 135/93  Pulse: 62 69  Resp: 19 20  Temp:  36.5 C  SpO2: 100% 95%    Last Pain:  Vitals:   09/17/19 1337  TempSrc:   PainSc: 0-No pain                 Montez Hageman

## 2019-09-17 NOTE — Op Note (Signed)
09/17/2019  11:40 AM  PATIENT:  Christian Hubbard  58 y.o. male  PRE-OPERATIVE DIAGNOSIS:   Umbilical Hernia  POST-OPERATIVE DIAGNOSIS:   Umbilical Hernia  PROCEDURE:  Procedure(s): UMBILICAL HERNIA REPAIR WITH MESH (N/A)  SURGEON:  Surgeon(s) and Role:    * Jovita Kussmaul, MD - Primary  PHYSICIAN ASSISTANT:   ASSISTANTS: none   ANESTHESIA:   local and general  EBL:  10 mL   BLOOD ADMINISTERED:none  DRAINS: none   LOCAL MEDICATIONS USED:  MARCAINE     SPECIMEN:  No Specimen  DISPOSITION OF SPECIMEN:  N/A  COUNTS:  YES  TOURNIQUET:  * No tourniquets in log *  DICTATION: .Dragon Dictation   After informed consent was obtained the patient was brought to the operating room and placed in the supine position on the operating table.  After adequate induction of general anesthesia the patient's abdomen was prepped with ChloraPrep, allowed to dry, and draped in usual sterile manner.  An appropriate timeout was performed.  The area around the umbilicus was infiltrated with quarter percent Marcaine.  A curvilinear incision was made with a 15 blade knife along the inferior edge of the umbilicus.  The incision was carried through the skin and subcutaneous tissue sharply with the electrocautery.  Blunt dissection was carried out around the hernia until the fascia was identified.  The hernia had some omentum in it but the actual fascial defect was very small.  The hernia sac was opened sharply with electrocautery.  The omentum would not spontaneously reduce because of the small size of the fascial defect.  The omentum and the sac was then excised by clamping it with hemostats, dividing it, and ligating it with 2-0 silk ties.  Once this was accomplished the omentum was able to be reduced without difficulty.  The fascial edges were then cleaned and appeared healthy.  Next a small 4.3 cm circular umbilical mesh was chosen.  The mesh was placed through the fascial defect just beneath the fascia  and was held in place with the anchors.  The mesh appeared to be in good position.  The fascial defect was then closed with interrupted #1 Novafil stitches incorporating the anchor with the closure.  The anchor was then trimmed of any excess mesh.  Once this was accomplished the hernia defect seemed to be well repaired and the mesh was in good position.  The wound was then irrigated with copious amounts of saline.  The base of the umbilicus was tacked down to the fascia with a 2-0 Vicryl stitch.  The subcutaneous tissue was closed with a 2-0 Vicryl stitch.  The skin was then closed with a running 4-0 Monocryl subcuticular stitch.  Dermabond dressings were applied.  The patient tolerated the procedure well.  At the end of the case all needle sponge and instrument counts were correct.  The patient was then awakened and taken to recovery in stable condition.  The skin of the umbilicus appeared healthy and viable.  PLAN OF CARE: Discharge to home after PACU  PATIENT DISPOSITION:  PACU - hemodynamically stable.   Delay start of Pharmacological VTE agent (>24hrs) due to surgical blood loss or risk of bleeding: not applicable

## 2019-09-17 NOTE — Interval H&P Note (Signed)
History and Physical Interval Note:  09/17/2019 9:11 AM  Christian Hubbard  has presented today for surgery, with the diagnosis of mass or lump of neck.  The various methods of treatment have been discussed with the patient and family. After consideration of risks, benefits and other options for treatment, the patient has consented to  Procedure(s): Excision of Left cheek lipoma (Left) UMBILICAL HERNIA REPAIR WITH MESH (N/A) as a surgical intervention.  The patient's history has been reviewed, patient examined, no change in status, stable for surgery.  I have reviewed the patient's chart and labs.  Questions were answered to the patient's satisfaction.     Autumn Messing III

## 2019-09-18 ENCOUNTER — Encounter (HOSPITAL_BASED_OUTPATIENT_CLINIC_OR_DEPARTMENT_OTHER): Payer: Self-pay | Admitting: Plastic Surgery

## 2019-09-18 LAB — SURGICAL PATHOLOGY

## 2019-09-19 ENCOUNTER — Telehealth: Payer: Self-pay

## 2019-09-19 NOTE — Telephone Encounter (Signed)
Called pt to inform him of his pathology report from left neck excision- per Dr. Marla Roe the results indicated a lipoma He reports that his incision is healing well- he denies any fever, drainage or other complications He has a f/u appoint on 09/26/19 with Matt(PA) & he will see Dr. Marlou Starks in November for the hernia repair f/u He reports that he is not having any complications with the abdominal surgery-other than moderate pain 5/10  he did initially have an issue with constipation- but he has taken OTC meds & has had favorable results He has not had any nausea or vomiting & is eating a "healthier" diet of chicken & vegetables & drinking water & hydrating well  Pt did ask about having a referral to Dr. Prescott Gum (Cardiothoracic) per Dr. Marla Roe she wanted Dr. Prescott Gum to evaluate his CT Scan re: a nodule at his thymus gland as precautionary Pt understands the plan of care & he will call for any concerns [facility]

## 2019-09-25 ENCOUNTER — Telehealth: Payer: Self-pay | Admitting: Surgical

## 2019-09-25 NOTE — Telephone Encounter (Signed)

## 2019-09-26 ENCOUNTER — Ambulatory Visit (INDEPENDENT_AMBULATORY_CARE_PROVIDER_SITE_OTHER): Payer: 59 | Admitting: Plastic Surgery

## 2019-09-26 ENCOUNTER — Encounter: Payer: Self-pay | Admitting: Plastic Surgery

## 2019-09-26 ENCOUNTER — Encounter: Payer: 59 | Admitting: Surgical

## 2019-09-26 ENCOUNTER — Other Ambulatory Visit: Payer: Self-pay

## 2019-09-26 DIAGNOSIS — D170 Benign lipomatous neoplasm of skin and subcutaneous tissue of head, face and neck: Secondary | ICD-10-CM

## 2019-09-26 NOTE — Progress Notes (Signed)
Patient is here for his postoperative follow-up undergoing a left cheek lipoma excision and hernia repair by Dr. Marlou Starks.  Still has a lot of swelling to left cheek area redness or sign of infection.  Knots were removed.  He should continue with a little bit of ice for the next 24 hours.  I like 2 weeks.

## 2019-10-01 ENCOUNTER — Other Ambulatory Visit: Payer: Self-pay | Admitting: Cardiothoracic Surgery

## 2019-10-01 ENCOUNTER — Other Ambulatory Visit: Payer: Self-pay

## 2019-10-01 ENCOUNTER — Encounter: Payer: Self-pay | Admitting: Cardiothoracic Surgery

## 2019-10-01 ENCOUNTER — Institutional Professional Consult (permissible substitution) (INDEPENDENT_AMBULATORY_CARE_PROVIDER_SITE_OTHER): Payer: 59 | Admitting: Cardiothoracic Surgery

## 2019-10-01 VITALS — BP 134/89 | HR 87 | Temp 97.7°F | Resp 16 | Ht 74.0 in | Wt 300.0 lb

## 2019-10-01 DIAGNOSIS — J9859 Other diseases of mediastinum, not elsewhere classified: Secondary | ICD-10-CM | POA: Diagnosis not present

## 2019-10-01 NOTE — Progress Notes (Signed)
PCP is Maury Dus, MD Referring Provider is Cleophus Molt, MD  Chief Complaint  Patient presents with  . Mediastinal Mass    prt CT NECK 05/23/19  . Adenopathy    HPI: Patient examined, most recent CT scan of neck personally reviewed with patient as well as previous CT scans of the chest going back to 2017.  Patient recently had neck surgery to remove a large lipoma.  As part of the evaluation a CT scan of the neck was performed which showed a lipoma as well as a probable enlarged lymph node measuring 2.5 cm in the upper mid mediastinum with an fat pad.  Patient denies any symptoms of lymphoma including weight loss fever night sweats.  Patient has had an enlarged lymph node in this same area noted in reviewing previous CT scans of the chest.  Last CTA in 2018 showed the same 2.5 cm lymph node.  Was treated by Dr. Elsworth Soho in 2017 for significant right lower lobe pneumonia and in fact underwent bronchoscopy and evaluation for tuberculosis.  This was all negative.  Cytologies were negative.  A right upper lobe nodule was felt to be inflammatory.  Patient has had no symptoms of pneumonia currently.  He stopped smoking in 2006.   Past Medical History:  Diagnosis Date  . GERD (gastroesophageal reflux disease)   . Hyperlipemia   . Hypertension   . OSA on CPAP   . Pneumonia 08/10/2014; 07/10/2017    Past Surgical History:  Procedure Laterality Date  . LIPOMA EXCISION Left 09/17/2019   Procedure: Excision of Left cheek lipoma;  Surgeon: Wallace Going, DO;  Location: [place];  Service: Plastics;  Laterality: Left;  . UMBILICAL HERNIA REPAIR N/A 09/17/2019   Procedure: UMBILICAL HERNIA REPAIR WITH MESH;  Surgeon: Jovita Kussmaul, MD;  Location: [place];  Service: General;  Laterality: N/A;  . VIDEO BRONCHOSCOPY Bilateral 07/02/2017   Procedure: VIDEO BRONCHOSCOPY WITH FLUORO;  Surgeon: Rigoberto Noel, MD;  Location: [facility];  Service:  Cardiopulmonary;  Laterality: Bilateral;    No family history on file.  Social History Social History   Tobacco Use  . Smoking status: Former Smoker    Packs/day: 1.00    Years: 24.00    Pack years: 24.00    Types: Cigarettes    Quit date: 11/27/2004    Years since quitting: 14.8  . Smokeless tobacco: Never Used  Substance Use Topics  . Alcohol use: Yes    Alcohol/week: 6.0 standard drinks    Types: 6 Cans of beer per week  . Drug use: No    Current Outpatient Medications  Medication Sig Dispense Refill  . amLODipine (NORVASC) 5 MG tablet Take 5 mg by mouth daily.   0  . HYDROcodone-acetaminophen (NORCO/VICODIN) 5-325 MG tablet Take 1-2 tablets by mouth every 6 (six) hours as needed for moderate pain or severe pain. 10 tablet 0  . losartan-hydrochlorothiazide (HYZAAR) 100-12.5 MG tablet Take 1 tablet by mouth daily.  0  . metFORMIN (GLUCOPHAGE) 500 MG tablet TK 1 T PO BID WC    . omeprazole (PRILOSEC) 40 MG capsule Take 40 mg by mouth daily.    Glory Rosebush Delica Lancets [id number] MISC     . ONETOUCH VERIO test strip USE TO CHECK BLOOD SUGARS ONCE DAILY ALTERNATING MORNING AND EVENINGS BEFORE MEALS    . pravastatin (PRAVACHOL) 40 MG tablet Take 40 mg by mouth daily.    . sildenafil (VIAGRA) 100 MG tablet Take  100 mg by mouth daily as needed for erectile dysfunction.     No current facility-administered medications for this visit.     No Known Allergies  Review of Systems  No fever weight loss or night sweats No chest pain No active dental disease No problems with general anesthesia when he had the lipoma removed No bleeding abnormalities No shortness of breath  BP 134/89 (BP Location: Right Arm, Patient Position: Sitting, Cuff Size: Large)   Pulse 87   Temp 97.7 F (36.5 C)   Resp 16   Ht 6\' 2"  (1.88 m)   Wt 300 lb (136.1 kg)   SpO2 95% Comment: RA  BMI 38.52 kg/m  Physical Exam      Exam    General- alert and comfortable    Neck- no JVD, no cervical adenopathy  palpable, no carotid bruit.  Well-healed scar over left mandible.   Lungs- clear without rales, wheezes   Cor- regular rate and rhythm, no murmur , gallop   Abdomen- soft, non-tender   Extremities - warm, non-tender, minimal edema   Neuro- oriented, appropriate, no focal weakness   Diagnostic Tests: CT of the neck shows a lobulated probable enlarged lymph node measuring 2.5 cm in the anterior upper mediastinum and a large fat pad.  Impression: Probable lymph node in the upper mediastinum but in order to assess malignant potential I have recommended a PET scan.  Plan: Patient will return after PET scan to discuss recommendations of  care.   Len Childs, MD [facility] [phone]

## 2019-10-10 ENCOUNTER — Other Ambulatory Visit: Payer: Self-pay

## 2019-10-10 ENCOUNTER — Ambulatory Visit (INDEPENDENT_AMBULATORY_CARE_PROVIDER_SITE_OTHER): Payer: 59 | Admitting: Plastic Surgery

## 2019-10-10 ENCOUNTER — Encounter: Payer: Self-pay | Admitting: Surgical

## 2019-10-10 VITALS — BP 139/83 | HR 82 | Temp 97.3°F | Ht 74.0 in | Wt 313.8 lb

## 2019-10-10 DIAGNOSIS — D170 Benign lipomatous neoplasm of skin and subcutaneous tissue of head, face and neck: Secondary | ICD-10-CM

## 2019-10-10 NOTE — Progress Notes (Signed)
   Subjective:     Patient ID: Christian Hubbard, male    DOB: 15-Jan-1961, 58 y.o.   MRN: [id number]  Chief Complaint  Patient presents with  . Follow-up    2 weeks for neck mass    HPI: The patient is a 58 y.o. male here for follow-up after excision of a left cheek lipoma with Dr. Marla Roe and hernia repair with Dr. Marlou Starks on 09/17/2019.   [name] reports he is doing well. States there is some hardness and swelling still where the lipoma was removed, but otherwise has no complaints. Denies any tenderness to palpation, pain, CP, SOB, fever, chills, N/V.  Incision is c/d/i. No signs of erythema, drainage, or infection.  Review of Systems  Constitutional: Negative for chills and fever.  Respiratory: Negative for shortness of breath.   Cardiovascular: Negative for chest pain and palpitations.  Musculoskeletal: Negative.   Skin: Negative for color change, pallor, rash and wound.     Objective:   Vital Signs BP 139/83 (BP Location: Left Arm, Patient Position: Sitting, Cuff Size: Large)   Pulse 82   Temp (!) 97.3 F (36.3 C) (Temporal)   Ht 6\' 2"  (1.88 m)   Wt (!) 313 lb 12.8 oz (142.3 kg)   SpO2 99%   BMI 40.29 kg/m  Vital Signs and Nursing Note Reviewed  Physical Exam  Constitutional: He is oriented to person, place, and time and well-developed, well-nourished, and in no distress.  HENT:  Head: Normocephalic and atraumatic.    Musculoskeletal: Normal range of motion.  Neurological: He is alert and oriented to person, place, and time. Gait normal.  Skin: Skin is warm and dry. No rash noted. No erythema.  Psychiatric: Memory, affect and judgment normal.      Assessment/Plan:     ICD-10-CM   1. 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 is  3 weeks post-op and doing well overall. Incision has healed nicely. Some slight swelling remains and scar tissue can be palpated.    Recommend daily circular massage to the area over the scar tissue. Follow up in 1 month.   Call office with any questions or concerns or if condition worsens.  Threasa Heads, PA-C 10/10/2019, 12:53 PM

## 2019-10-14 ENCOUNTER — Other Ambulatory Visit: Payer: Self-pay

## 2019-10-14 ENCOUNTER — Ambulatory Visit (HOSPITAL_COMMUNITY)
Admission: RE | Admit: 2019-10-14 | Discharge: 2019-10-14 | Disposition: A | Discharge status: Home / Self Care | Payer: 59 | Source: Ambulatory Visit | Attending: Cardiothoracic Surgery | Admitting: Cardiothoracic Surgery

## 2019-10-14 DIAGNOSIS — J9859 Other diseases of mediastinum, not elsewhere classified: Secondary | ICD-10-CM | POA: Diagnosis not present

## 2019-10-14 LAB — GLUCOSE, CAPILLARY: Glucose-Capillary: 129 mg/dL — ABNORMAL HIGH (ref 70–99)

## 2019-10-14 MED ORDER — FLUDEOXYGLUCOSE F - 18 (FDG) INJECTION
15.7600 | Freq: Once | INTRAVENOUS | Status: AC | PRN
  Administered 2019-10-14: 15.76 via INTRAVENOUS

## 2019-10-29 ENCOUNTER — Ambulatory Visit (INDEPENDENT_AMBULATORY_CARE_PROVIDER_SITE_OTHER): Payer: 59 | Admitting: Cardiothoracic Surgery

## 2019-10-29 ENCOUNTER — Encounter: Payer: Self-pay | Admitting: Cardiothoracic Surgery

## 2019-10-29 ENCOUNTER — Other Ambulatory Visit: Payer: Self-pay

## 2019-10-29 DIAGNOSIS — D150 Benign neoplasm of thymus: Secondary | ICD-10-CM | POA: Diagnosis not present

## 2019-10-29 NOTE — Progress Notes (Signed)
PCP is Maury Dus, MD Referring Provider is Cleophus Molt, MD  Chief Complaint  Patient presents with  . Follow-up    mediastinal lesion, f/u after PET scan  Small faded density in the anterior mediastinal fat seen as an incidental finding for evaluation of lipoma of left neck, subsequent resection of left neck lipoma.   HPI: Patient returns after PET scan for discussion of a 2.7 cm faint density in the anterior mediastinal fat.  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  The patient has had several chest CT scans since 2016 by pulmonary medicine with follow-up small pulmonary nodules and a right lower lobe pneumatocele as the result of a previous cavitary pneumonia.Marland Kitchen  He is also had bilateral pneumonia in the past with bilateral scarring..  All of these areas are stable.  In my opinion based on findings of the CT-PET scan patient does not meet criteria for resection of the small anterior mediastinal density due to low probability of malignancy.  I have recommended that we get a follow-up CT scan next year   Past Medical History:  Diagnosis Date  . GERD (gastroesophageal reflux disease)   . Hyperlipemia   . Hypertension   . OSA on CPAP   . Pneumonia 08/10/2014; 07/10/2017    Past Surgical History:  Procedure Laterality Date  . LIPOMA EXCISION Left 09/17/2019   Procedure: Excision of Left cheek lipoma;  Surgeon: Wallace Going, DO;  Location: [place];  Service: Plastics;  Laterality: Left;  . UMBILICAL HERNIA REPAIR N/A 09/17/2019   Procedure: UMBILICAL HERNIA REPAIR WITH MESH;  Surgeon: Jovita Kussmaul, MD;  Location: [place];  Service: General;  Laterality: N/A;  . VIDEO BRONCHOSCOPY Bilateral 07/02/2017   Procedure: VIDEO BRONCHOSCOPY WITH FLUORO;  Surgeon: Rigoberto Noel, MD;  Location: [facility];  Service: Cardiopulmonary;  Laterality: Bilateral;    No  family history on file.  Social History Social History   Tobacco Use  . Smoking status: Former Smoker    Packs/day: 1.00    Years: 24.00    Pack years: 24.00    Types: Cigarettes    Quit date: 11/27/2004    Years since quitting: 14.9  . Smokeless tobacco: Never Used  Substance Use Topics  . Alcohol use: Yes    Alcohol/week: 6.0 standard drinks    Types: 6 Cans of beer per week  . Drug use: No    Current Outpatient Medications  Medication Sig Dispense Refill  . amLODipine (NORVASC) 5 MG tablet Take 5 mg by mouth daily.   0  . losartan-hydrochlorothiazide (HYZAAR) 100-12.5 MG tablet Take 1 tablet by mouth daily.  0  . metFORMIN (GLUCOPHAGE) 500 MG tablet TK 1 T PO BID WC    . omeprazole (PRILOSEC) 40 MG capsule Take 40 mg by mouth daily.    Glory Rosebush Delica Lancets [id number] MISC     . ONETOUCH VERIO test strip USE TO CHECK BLOOD SUGARS ONCE DAILY ALTERNATING MORNING AND EVENINGS BEFORE MEALS    . pravastatin (PRAVACHOL) 40 MG tablet Take 40 mg by mouth daily.    . sildenafil (VIAGRA) 100 MG tablet Take 100 mg by mouth daily as needed for erectile dysfunction.     No current facility-administered medications for this visit.     No Known Allergies  Review of Systems  No fever No productive cough or chest pain No shortness of breath Left neck  incision for resection of lipoma is healed well No difficulty swallowing  BP (!) 158/91 (BP Location: Left Arm)   Pulse 73   Temp (!) 97.5 F (36.4 C) (Skin)   Resp 16   Ht 6\' 2"  (1.88 m)   Wt (!) 316 lb (143.3 kg)   SpO2 97% Comment: RA  BMI 40.57 kg/m  Physical Exam      Exam    General- alert and comfortable    Neck- no JVD, no cervical adenopathy palpable, no carotid bruit   Lungs- clear without rales, wheezes   Cor- regular rate and rhythm, no murmur , gallop   Abdomen- soft, non-tender   Extremities - warm, non-tender, minimal edema   Neuro- oriented, appropriate, no focal weakness   Diagnostic Tests: PET scan  images personally reviewed and discussed with patient.  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  Impression: Mediastinal density does not meet criteria for resection. We will plan on following with another CT scan for surveillance next year. Plan: Return with repeat CT scan of chest with contrast next year.   Len Childs, MD [facility] [phone]

## 2019-11-04 ENCOUNTER — Ambulatory Visit: Payer: 59 | Admitting: Surgical

## 2019-11-05 ENCOUNTER — Ambulatory Visit (INDEPENDENT_AMBULATORY_CARE_PROVIDER_SITE_OTHER): Payer: 59 | Admitting: Surgical

## 2019-11-05 ENCOUNTER — Other Ambulatory Visit: Payer: Self-pay

## 2019-11-05 ENCOUNTER — Encounter: Payer: Self-pay | Admitting: Surgical

## 2019-11-05 VITALS — BP 143/89 | HR 85 | Temp 97.3°F | Ht 74.0 in | Wt 318.0 lb

## 2019-11-05 DIAGNOSIS — D170 Benign lipomatous neoplasm of skin and subcutaneous tissue of head, face and neck: Secondary | ICD-10-CM

## 2019-11-05 DIAGNOSIS — R221 Localized swelling, mass and lump, neck: Secondary | ICD-10-CM

## 2019-11-05 NOTE — Progress Notes (Signed)
Christian Hubbard is a 58 year old male here for follow-up after excision of left cheek lipoma on 10/21 with Dr. Marla Roe.  [name] is doing well.  He is very pleased with his progress in healing.  The incision is healed healing well.  There is some thick scar tissue forming under the skin where the incision is, he has been massaging the area.  There is no erythema, dehiscence, drainage noted.  He has not had any fevers, chills, nausea, vomiting.  BP (!) 143/89 (BP Location: Left Arm, Patient Position: Sitting, Cuff Size: Large)   Pulse 85   Temp (!) 97.3 F (36.3 C) (Temporal)   Ht 6\' 2"  (1.88 m)   Wt (!) 318 lb (144.2 kg)   SpO2 97%   BMI 40.83 kg/m   Physical Exam Constitutional:      General: He is not in acute distress.    Appearance: Normal appearance. He is not ill-appearing.  Neck:     Musculoskeletal: Normal range of motion and neck supple. No neck rigidity or muscular tenderness.   Cardiovascular:     Rate and Rhythm: Normal rate.  Pulmonary:     Effort: Pulmonary effort is normal.  Musculoskeletal: Normal range of motion.  Skin:    General: Skin is warm and dry.  Neurological:     General: No focal deficit present.     Mental Status: He is alert and oriented to person, place, and time. Mental status is at baseline.  Psychiatric:        Mood and Affect: Mood normal.        Behavior: Behavior normal.    A/P:  [name] is doing well.  He is pleased with his progress.  Recommend continuing to massage the incision with Vaseline or unscented lotion.  The scar tissue should break up over time with continued massage.  Sunscreen over the incision when exposed to the sun.  Follow-up as needed, call with any questions or concerns.

## 2020-10-14 ENCOUNTER — Other Ambulatory Visit: Payer: Self-pay | Admitting: Cardiothoracic Surgery

## 2020-10-14 DIAGNOSIS — J9859 Other diseases of mediastinum, not elsewhere classified: Secondary | ICD-10-CM

## 2020-11-17 ENCOUNTER — Ambulatory Visit: Payer: 59 | Admitting: Cardiothoracic Surgery

## 2020-11-17 ENCOUNTER — Other Ambulatory Visit: Payer: 59

## 2020-12-06 ENCOUNTER — Ambulatory Visit
Admission: RE | Admit: 2020-12-06 | Discharge: 2020-12-06 | Disposition: A | Discharge status: Home / Self Care | Payer: 59 | Source: Ambulatory Visit | Attending: Cardiothoracic Surgery | Admitting: Cardiothoracic Surgery

## 2020-12-06 DIAGNOSIS — J9859 Other diseases of mediastinum, not elsewhere classified: Secondary | ICD-10-CM

## 2020-12-06 MED ORDER — IOPAMIDOL (ISOVUE-300) INJECTION 61%
75.0000 mL | Freq: Once | INTRAVENOUS | Status: AC | PRN
  Administered 2020-12-06: 75 mL via INTRAVENOUS

## 2020-12-08 ENCOUNTER — Other Ambulatory Visit: Payer: Self-pay

## 2020-12-08 ENCOUNTER — Encounter: Payer: Self-pay | Admitting: Cardiothoracic Surgery

## 2020-12-08 ENCOUNTER — Ambulatory Visit: Payer: 59 | Admitting: Cardiothoracic Surgery

## 2020-12-08 VITALS — BP 127/84 | HR 89 | Temp 97.6°F | Resp 20 | Ht 74.0 in | Wt 320.0 lb

## 2020-12-08 DIAGNOSIS — J9859 Other diseases of mediastinum, not elsewhere classified: Secondary | ICD-10-CM

## 2020-12-08 DIAGNOSIS — D150 Benign neoplasm of thymus: Secondary | ICD-10-CM | POA: Diagnosis not present

## 2020-12-08 NOTE — Progress Notes (Signed)
PCP is Maury Dus, MD Referring Provider is Cleophus Molt, MD  Chief Complaint  Patient presents with  . Mediastinal Mass    1 year f/u with Chest CT- surveillance    HPI: 1 year follow-up with chest CT scan this 60 year old male non-smoker who had an incidental finding on preop scan for resection of large neck lipoma of a poorly defined anterior mediastinal density with minimal activity on PET scan consistent with benign thymoma or lymph node.  He also has a large amount of mediastinal fat consistent with his weight of 320 pounds.  On review of today's CT scan the anterior mediastinal density showed some resolution and is less prominent.  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  Patient is a non-smoker.   Denies any recent symptoms of pneumonia, URI. He has been vaccinated for COVID-19 as well as influenza. He has not been hospitalized since his last visit and is not required any antibiotics. Past Medical History:  Diagnosis Date  . GERD (gastroesophageal reflux disease)   . Hyperlipemia   . Hypertension   . OSA on CPAP   . Pneumonia 08/10/2014; 07/10/2017    Past Surgical History:  Procedure Laterality Date  . LIPOMA EXCISION Left 09/17/2019   Procedure: Excision of Left cheek lipoma;  Surgeon: Wallace Going, DO;  Location: [facility];  Service: Plastics;  Laterality: Left;  . UMBILICAL HERNIA REPAIR N/A 09/17/2019   Procedure: UMBILICAL HERNIA REPAIR WITH MESH;  Surgeon: Jovita Kussmaul, MD;  Location: [facility];  Service: General;  Laterality: N/A;  . VIDEO BRONCHOSCOPY Bilateral 07/02/2017   Procedure: VIDEO BRONCHOSCOPY WITH FLUORO;  Surgeon: Rigoberto Noel, MD;  Location: [facility];  Service: Cardiopulmonary;  Laterality: Bilateral;    History reviewed. No  pertinent family history.  Social History Social History   Tobacco Use  . Smoking status: Former Smoker    Packs/day: 1.00    Years: 24.00    Pack years: 24.00    Types: Cigarettes    Quit date: 11/27/2004    Years since quitting: 16.0  . Smokeless tobacco: Never Used  Vaping Use  . Vaping Use: Never used  Substance Use Topics  . Alcohol use: Yes    Alcohol/week: 6.0 standard drinks    Types: 6 Cans of beer per week  . Drug use: No    Current Outpatient Medications  Medication Sig Dispense Refill  . amLODipine (NORVASC) 5 MG tablet Take 5 mg by mouth daily.   0  . losartan-hydrochlorothiazide (HYZAAR) 100-12.5 MG tablet Take 1 tablet by mouth daily.  0  . metFORMIN (GLUCOPHAGE) 500 MG tablet TK 1 T PO BID WC    . omeprazole (PRILOSEC) 40 MG capsule Take 40 mg by mouth daily.    Glory [facility] Delica Lancets 45X MISC     . ONETOUCH VERIO test strip USE TO CHECK BLOOD SUGARS ONCE DAILY ALTERNATING MORNING AND EVENINGS BEFORE MEALS    . pravastatin (PRAVACHOL) 40 MG tablet Take 40 mg by mouth daily.    . sildenafil (VIAGRA) 100 MG tablet Take 100 mg by mouth daily as needed for erectile dysfunction.     No current facility-administered medications for this visit.    No Known Allergies  Review of Systems  Fever Weight stable No chest pain  No productive cough or hemoptysis No edema No dizziness or presyncope No difficulty swallowing  BP 127/84   Pulse 89   Temp 97.6 F (36.4 C) (Skin)   Resp 20   Ht 6\' 2"  (1.88 m)   Wt (!) 320 lb (145.2 kg)   SpO2 98% Comment: RA  BMI 41.09 kg/m  Physical Exam      Exam    General- alert and comfortable    Neck- no JVD, no cervical adenopathy palpable, no carotid bruit   Lungs- clear without rales, wheezes   Cor- regular rate and rhythm, no murmur , gallop   Abdomen- soft, non-tender   Extremities - warm, non-tender, minimal edema   Neuro- oriented, appropriate, no focal weakness  Diagnostic Tests: She is CT scan of the  chest performed this week personally reviewed with results as noted above.  The anterior mediastinal density is partially resolved and appears benign.  However there is new soft tissue density at the upper aspect of the pneumatocele in the right lower lobe which has formed a discrete 1.5 cm nodule.  This will need follow-up CT scan in 6 months.  Impression: New right lower lobe nodule at the superior aspect of a previous pneumatocele following cavitary pneumonia 2018.  Plan: Patient will need CT scan follow-up in 6 months.  The patient will be set up to be evaluated by Dr. Kipp Brood at that time due to my retirement.  Len Childs, MD [facility] [phone]

## 2021-05-05 ENCOUNTER — Other Ambulatory Visit: Payer: Self-pay | Admitting: *Deleted

## 2021-05-05 DIAGNOSIS — R911 Solitary pulmonary nodule: Secondary | ICD-10-CM

## 2021-06-10 ENCOUNTER — Other Ambulatory Visit: Payer: 59

## 2021-06-10 ENCOUNTER — Encounter: Payer: 59 | Admitting: Thoracic Surgery (Cardiothoracic Vascular Surgery)

## 2021-07-15 ENCOUNTER — Ambulatory Visit: Payer: 59 | Admitting: Thoracic Surgery (Cardiothoracic Vascular Surgery)

## 2021-07-15 ENCOUNTER — Encounter: Payer: Self-pay | Admitting: Thoracic Surgery (Cardiothoracic Vascular Surgery)

## 2021-07-15 ENCOUNTER — Other Ambulatory Visit: Payer: Self-pay

## 2021-07-15 ENCOUNTER — Ambulatory Visit
Admission: RE | Admit: 2021-07-15 | Discharge: 2021-07-15 | Disposition: A | Discharge status: Home / Self Care | Payer: 59 | Source: Ambulatory Visit | Attending: Thoracic Surgery (Cardiothoracic Vascular Surgery) | Admitting: Thoracic Surgery (Cardiothoracic Vascular Surgery)

## 2021-07-15 VITALS — BP 135/85 | HR 80 | Resp 20 | Ht 74.0 in | Wt 299.0 lb

## 2021-07-15 DIAGNOSIS — D150 Benign neoplasm of thymus: Secondary | ICD-10-CM

## 2021-07-15 DIAGNOSIS — R911 Solitary pulmonary nodule: Secondary | ICD-10-CM

## 2021-07-15 MED ORDER — IOPAMIDOL (ISOVUE-300) INJECTION 61%
75.0000 mL | Freq: Once | INTRAVENOUS | Status: AC | PRN
  Administered 2021-07-15: 75 mL via INTRAVENOUS

## 2021-07-15 NOTE — Progress Notes (Signed)
      [id number]       ,[place] [address]             [phone]        Zyire A [place] Medical Record [id number] Date of Birth: 05/27/1961  Referring: Cleophus Molt, MD Primary Care: Maury Dus, MD Primary Cardiologist:None  Reason for visit:   follow-up  History of Present Illness:     Mr. Yother presents with a 61-monthCT scan.  He was previously followed by Dr. VDarcey Norafor a mediastinal mass.  This was originally identified in 2020.  He did have a PET/CT with mild FDG avidity.  Serial images have shown that it has been stable.  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  He does not have any symptoms.  His weight has been stable.  Physical Exam: BP 135/85   Pulse 80   Resp 20   Ht '6\' 2"'$  (1.88 m)   Wt 299 lb (135.6 kg)   SpO2 96% Comment: RA  BMI 38.39 kg/m   Alert NAD regular rate Easy work of breathing. Abdomen is nondistended. Extremities are warm.  Diagnostic Studies & Laboratory data: CT chest from 07/15/2021: Mediastinum/Nodes: Ill-defined nodular soft tissue density lesions in the anterior mediastinum are unchanged in size since January. No enlarged mediastinal, hilar, or axillary lymph nodes. Thyroid gland, trachea, and esophagus demonstrate no significant findings.   Lungs/Pleura: Similar appearing architectural distortion, scarring, and cystic change in the posterior right middle lobe and anterior right lower lobe. Thin walled cavitary lesion in the right lower lobe is unchanged in size, measuring 4.3 x 3.9 cm, previously 4.3 x 3.8 cm. Increased soft tissue nodularity versus layering debris along the posteroinferior margin of the cavitation measuring 1.0 cm. Soft tissue nodularity along the superior margin is unchanged, measuring 14 mm, previously 15 mm. Unchanged 4 mm nodule in the anterior right upper lobe (series 8, image 55). No pleural effusion or  pneumothorax.    Assessment / Plan:   60year old male with soft tissue densities within the anterior mediastinum.  They appear stable over the last 6 months.  He also has some emphysematous changes and pneumatocele on the right side from pneumonia.  In regards to the anterior mediastinum, I have offered him a robotic assisted thymectomy for diagnostic purposes.  I explained to him that this is very unlikely that we would have to do a sternotomy.  He states that he would like to continue surveillance for this.  I will see him back in 1year with another CT scan of the chest.    HLucile CraterLightfoot 07/15/2021 4:05 PM

## 2021-12-10 ENCOUNTER — Encounter (HOSPITAL_COMMUNITY): Payer: Self-pay | Admitting: Emergency Medicine

## 2021-12-10 ENCOUNTER — Emergency Department (HOSPITAL_COMMUNITY)
Admission: EM | Admit: 2021-12-10 | Discharge: 2021-12-10 | Disposition: A | Discharge status: Home / Self Care | Payer: Worker's Compensation | Attending: Emergency Medicine | Admitting: Emergency Medicine

## 2021-12-10 ENCOUNTER — Emergency Department (HOSPITAL_COMMUNITY): Payer: Worker's Compensation

## 2021-12-10 DIAGNOSIS — M545 Low back pain, unspecified: Secondary | ICD-10-CM | POA: Diagnosis not present

## 2021-12-10 DIAGNOSIS — X500XXA Overexertion from strenuous movement or load, initial encounter: Secondary | ICD-10-CM | POA: Insufficient documentation

## 2021-12-10 MED ORDER — METHOCARBAMOL 500 MG PO TABS: 500.0000 mg | ORAL_TABLET | Freq: Three times a day (TID) | ORAL | 0 refills | Status: DC | PRN

## 2021-12-10 MED ORDER — NAPROXEN 500 MG PO TABS: 500.0000 mg | ORAL_TABLET | Freq: Two times a day (BID) | ORAL | 0 refills | Status: DC | PRN

## 2021-12-10 MED ORDER — OXYCODONE-ACETAMINOPHEN 5-325 MG PO TABS
1.0000 | ORAL_TABLET | Freq: Once | ORAL | Status: AC
  Administered 2021-12-10: 1 via ORAL
  Filled 2021-12-10: qty 1

## 2021-12-10 MED ORDER — HYDROMORPHONE HCL 1 MG/ML IJ SOLN
1.0000 mg | Freq: Once | INTRAMUSCULAR | Status: AC
  Administered 2021-12-10: 1 mg via INTRAMUSCULAR
  Filled 2021-12-10: qty 1

## 2021-12-10 NOTE — ED Provider Notes (Signed)
[facility] DEPT Provider Note   CSN: [id number] Arrival date & time: 12/10/21  0915     History  Chief Complaint  Patient presents with   Back Pain    Christian Hubbard is a 61 y.o. male.  Presents to ER with concern for low back pain.  Has had history of prior low back pain but pain got significantly worse after heavy lifting yesterday evening.  Pain is moderate to severe, worse with certain movements and improved with rest and certain positions.  Currently moderate.  Throbbing, low back, at times radiates towards right leg.  No associated numbness, weakness, bladder or bowel incontinence or urinary retention.  No fevers or chills.  HPI     Home Medications Prior to Admission medications   Medication Sig Start Date End Date Taking? Authorizing Provider  methocarbamol (ROBAXIN) 500 MG tablet Take 1 tablet (500 mg total) by mouth every 8 (eight) hours as needed for muscle spasms. 12/10/21  Yes Lucrezia Starch, MD  naproxen (NAPROSYN) 500 MG tablet Take 1 tablet (500 mg total) by mouth 2 (two) times daily as needed. 12/10/21  Yes Lucrezia Starch, MD  amLODipine (NORVASC) 5 MG tablet Take 5 mg by mouth daily.  11/25/15   [provider]  losartan-hydrochlorothiazide (HYZAAR) 100-12.5 MG tablet Take 1 tablet by mouth daily. 11/23/15   [provider]  metFORMIN (GLUCOPHAGE) 500 MG tablet TK 1 T PO BID WC 05/12/19   [provider]  omeprazole (PRILOSEC) 40 MG capsule Take 40 mg by mouth daily.    [provider]  OneTouch Delica Lancets 09Q [place]  06/30/19   [provider]  [facility] VERIO test strip USE TO CHECK BLOOD SUGARS ONCE DAILY ALTERNATING MORNING AND EVENINGS BEFORE MEALS 07/03/19   [provider]  pravastatin (PRAVACHOL) 40 MG tablet Take 40 mg by mouth daily.    [provider]  sildenafil (VIAGRA) 100 MG tablet Take 100 mg by mouth daily as needed for erectile dysfunction.     [provider]      Allergies    Patient has no known allergies.    Review of Systems   Review of Systems  Constitutional:  Negative for chills, fatigue and fever.  HENT:  Negative for ear pain and sore throat.   Eyes:  Negative for pain and visual disturbance.  Respiratory:  Negative for cough and shortness of breath.   Cardiovascular:  Negative for chest pain and palpitations.  Gastrointestinal:  Negative for abdominal pain and vomiting.  Genitourinary:  Negative for dysuria and hematuria.  Musculoskeletal:  Positive for back pain. Negative for arthralgias.  Skin:  Negative for color change and rash.  Neurological:  Negative for seizures and syncope.  All other systems reviewed and are negative.  Physical Exam Updated Vital Signs BP 140/80    Pulse 70    Temp 98 F (36.7 C) (Oral)    Resp 18    SpO2 93%  Physical Exam Vitals and nursing note reviewed.  Constitutional:      General: He is not in acute distress.    Appearance: He is well-developed.  HENT:     Head: Normocephalic and atraumatic.  Eyes:     Conjunctiva/sclera: Conjunctivae normal.  Cardiovascular:     Rate and Rhythm: Normal rate and regular rhythm.     Heart sounds: No murmur heard. Pulmonary:     Effort: Pulmonary effort is normal. No respiratory distress.     Breath  sounds: Normal breath sounds.  Abdominal:     Palpations: Abdomen is soft.     Tenderness: There is no abdominal tenderness.  Musculoskeletal:        General: No swelling.     Cervical back: Neck supple.     Comments: There is some tenderness across the lumbar region, there is no step-off or deformity  Skin:    General: Skin is warm and dry.     Capillary Refill: Capillary refill takes less than 2 seconds.  Neurological:     Mental Status: He is alert.  Psychiatric:        Mood and Affect: Mood normal.    ED Results / Procedures / Treatments   Labs (all labs ordered are listed, but only abnormal results are  displayed) Labs Reviewed - No data to display  EKG None  Radiology DG Lumbar Spine Complete  Result Date: 12/10/2021 CLINICAL DATA:  Injury last week.  Low back pain. EXAM: LUMBAR SPINE - COMPLETE 4+ VIEW COMPARISON:  None. FINDINGS: Mild depression of the anterior upper endplate of L2 with associated sclerosis. This is most likely due to degenerative change and Schmorl's node. Fracture likely but not excluded. Remaining vertebra normal in height. There is no spondylolisthesis. Mild loss of disc height from L1-L2 through L4-L5. Endplate spurring noted most evident L2, L3 and L4. Surrounding soft tissues are unremarkable. IMPRESSION: 1. 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 Fracture not excluded but felt unlikely. 2. No other evidence of a fracture. Mild disc degenerative changes as detailed. Electronically Signed   By: Lajean Manes M.D.   On: 12/10/2021 12:25    Procedures Procedures    Medications Ordered in ED Medications  oxyCODONE-acetaminophen (PERCOCET/ROXICET) 5-325 MG per tablet 1 tablet (1 tablet Oral Given 12/10/21 1053)  HYDROmorphone (DILAUDID) injection 1 mg (1 mg Intramuscular Given 12/10/21 1202)    ED Course/ Medical Decision Making/ A&P                           Medical Decision Making  61 year old gentleman presents to ER with concern for acute on chronic back pain, worsened by heavy lifting yesterday.  On exam he appears well in no acute distress, no neurologic complaint and neuro intact.  Noted some tenderness across lumbar region but no step-off or deformity.  Check plain films, reviewed independently.  Per radiologist, concern for slight compression of upper endplate of L2 most consistent with degenerative Schmorl's node, fracture not excluded but felt unlikely.  Suspect more likely degenerative process based on history.  Given patient is neuro intact, pain well controlled, walking, do not feel patient needs  emergent MRI.  Differential also includes lumbar radiculopathy, sciatica, muscle strain.  Pain improved after giving dose of Dilaudid. patient reports that he previously has been to orthopedic surgery for back pain and had previously had to undergo physical therapy prior to having a full recovery previously.  Offered course of steroids but patient does not want to pursue this due to concerns for some of the side effects from steroids.  Will give Rx for muscle relaxer and recommend following up with his prior orthopedist or following up with our on call ortho as an out pt. Reviewed return precautions and dc home.         Final Clinical Impression(s) / ED Diagnoses Final diagnoses:  Low back pain, unspecified back pain laterality, unspecified chronicity, unspecified whether  sciatica present    Rx / DC Orders ED Discharge Orders          Ordered    methocarbamol (ROBAXIN) 500 MG tablet  Every 8 hours PRN        12/10/21 1241    naproxen (NAPROSYN) 500 MG tablet  2 times daily PRN        12/10/21 1244              Lucrezia Starch, MD 12/11/21 0730

## 2021-12-10 NOTE — Discharge Instructions (Addendum)
Please follow-up with your primary care doctor as well as your orthopedic specialist.  Recommend taking the prescribed naproxen or Robaxin as needed.  Come back to ER if you develop uncontrolled pain, weakness, numbness in your extremities, bladder or bowel incontinence or other new concerning symptom.

## 2021-12-10 NOTE — ED Triage Notes (Signed)
Patient c/o lower back pain after injury last night. States he was heavy lifting at that time. Ambulatory with cane.

## 2021-12-15 ENCOUNTER — Emergency Department (HOSPITAL_COMMUNITY)
Admission: EM | Admit: 2021-12-15 | Discharge: 2021-12-15 | Disposition: A | Discharge status: Home / Self Care | Payer: Worker's Compensation | Attending: Emergency Medicine | Admitting: Emergency Medicine

## 2021-12-15 ENCOUNTER — Encounter (HOSPITAL_COMMUNITY): Payer: Self-pay | Admitting: Emergency Medicine

## 2021-12-15 DIAGNOSIS — M5431 Sciatica, right side: Secondary | ICD-10-CM

## 2021-12-15 DIAGNOSIS — X500XXA Overexertion from strenuous movement or load, initial encounter: Secondary | ICD-10-CM | POA: Insufficient documentation

## 2021-12-15 DIAGNOSIS — R202 Paresthesia of skin: Secondary | ICD-10-CM | POA: Insufficient documentation

## 2021-12-15 DIAGNOSIS — M5441 Lumbago with sciatica, right side: Secondary | ICD-10-CM | POA: Diagnosis not present

## 2021-12-15 DIAGNOSIS — M79604 Pain in right leg: Secondary | ICD-10-CM | POA: Insufficient documentation

## 2021-12-15 DIAGNOSIS — R200 Anesthesia of skin: Secondary | ICD-10-CM | POA: Diagnosis not present

## 2021-12-15 MED ORDER — DEXAMETHASONE SODIUM PHOSPHATE 10 MG/ML IJ SOLN
10.0000 mg | Freq: Once | INTRAMUSCULAR | Status: AC
  Administered 2021-12-15: 10 mg via INTRAMUSCULAR
  Filled 2021-12-15: qty 1

## 2021-12-15 MED ORDER — OXYCODONE-ACETAMINOPHEN 5-325 MG PO TABS: 1.0000 | ORAL_TABLET | Freq: Three times a day (TID) | ORAL | 0 refills | Status: AC | PRN

## 2021-12-15 MED ORDER — HYDROMORPHONE HCL 1 MG/ML IJ SOLN
1.0000 mg | Freq: Once | INTRAMUSCULAR | Status: AC
  Administered 2021-12-15: 1 mg via INTRAMUSCULAR
  Filled 2021-12-15: qty 1

## 2021-12-15 MED ORDER — KETOROLAC TROMETHAMINE 30 MG/ML IJ SOLN
30.0000 mg | Freq: Once | INTRAMUSCULAR | Status: AC
  Administered 2021-12-15: 30 mg via INTRAMUSCULAR
  Filled 2021-12-15: qty 1

## 2021-12-15 MED ORDER — PREDNISONE 20 MG PO TABS: 40.0000 mg | ORAL_TABLET | Freq: Every day | ORAL | 0 refills | Status: AC

## 2021-12-15 NOTE — ED Provider Notes (Signed)
[facility] DEPT Provider Note   CSN: [id number] Arrival date & time: 12/15/21  1057     History  Chief Complaint  Patient presents with   Back Pain    Christian Hubbard is a 61 y.o. male.  With no pertinent past medical history presents emergency department with low back pain.  Patient states that around 6 days ago he was doing heavy lifting when he began having low back pain.  He states that at that time he was seen at the emergency department and discharged with a muscle relaxant and pain medication.  He states that he is continuing to have low back pain with radiation of pain down to his right leg.  He states that it makes it very difficult for him to walk and he is using a cane at this time.  He states that the pain is sharp and shooting and intermittent that goes down the leg all the way to his toe and is associated with numbness and tingling of his foot.  He denies saddle anesthesia, weakness of his lower extremities, incontinence of bowel or bladder, fevers or IV drug use.  HPI     Home Medications Prior to Admission medications   Medication Sig Start Date End Date Taking? Authorizing Provider  amLODipine (NORVASC) 5 MG tablet Take 5 mg by mouth daily.  11/25/15   [provider]  losartan-hydrochlorothiazide (HYZAAR) 100-12.5 MG tablet Take 1 tablet by mouth daily. 11/23/15   [provider]  metFORMIN (GLUCOPHAGE) 500 MG tablet TK 1 T PO BID WC 05/12/19   [provider]  methocarbamol (ROBAXIN) 500 MG tablet Take 1 tablet (500 mg total) by mouth every 8 (eight) hours as needed for muscle spasms. 12/10/21   Lucrezia Starch, MD  naproxen (NAPROSYN) 500 MG tablet Take 1 tablet (500 mg total) by mouth 2 (two) times daily as needed. 12/10/21   Lucrezia Starch, MD  omeprazole (PRILOSEC) 40 MG capsule Take 40 mg by mouth daily.    [provider]  OneTouch Delica Lancets 46T [place]  06/30/19   [provider]  [facility] VERIO test strip USE TO CHECK BLOOD SUGARS ONCE DAILY ALTERNATING MORNING AND EVENINGS BEFORE MEALS 07/03/19   [provider]  pravastatin (PRAVACHOL) 40 MG tablet Take 40 mg by mouth daily.    [provider]  sildenafil (VIAGRA) 100 MG tablet Take 100 mg by mouth daily as needed for erectile dysfunction.    [provider]      Allergies    Patient has no known allergies.    Review of Systems   Review of Systems  Musculoskeletal:  Positive for back pain and gait problem.  Neurological:  Positive for numbness. Negative for weakness.  All other systems reviewed and are negative.  Physical Exam Updated Vital Signs BP (!) 138/103    Pulse 70    Temp 98.4 F (36.9 C) (Oral)    Resp 18    SpO2 100%  Physical Exam Vitals and nursing note reviewed.  Constitutional:      General: He is not in acute distress.    Appearance: Normal appearance. He is not ill-appearing or toxic-appearing.  HENT:     Head: Normocephalic and atraumatic.  Eyes:     General: No scleral icterus.    Pupils: Pupils are equal, round, and reactive to light.  Cardiovascular:     Pulses: Normal pulses.  Pulmonary:     Effort: Pulmonary effort is  normal. No respiratory distress.  Musculoskeletal:        General: Tenderness present. No swelling or deformity.     Cervical back: Normal range of motion and neck supple. No tenderness.     Lumbar back: No swelling, deformity or tenderness. Positive right straight leg raise test.  Skin:    General: Skin is warm and dry.     Capillary Refill: Capillary refill takes less than 2 seconds.     Findings: No rash.  Neurological:     General: No focal deficit present.     Mental Status: He is alert and oriented to person, place, and time. Mental status is at baseline.     Cranial Nerves: No cranial nerve deficit.     Sensory: No sensory deficit.     Motor: No weakness.     Gait: Gait abnormal.     Comments: Walking with a cane due to  pain  Psychiatric:        Mood and Affect: Mood normal.        Behavior: Behavior normal.        Thought Content: Thought content normal.        Judgment: Judgment normal.    ED Results / Procedures / Treatments   Labs (all labs ordered are listed, but only abnormal results are displayed) Labs Reviewed - No data to display  EKG None  Radiology No results found.  Procedures Procedures    Medications Ordered in ED Medications  dexamethasone (DECADRON) injection 10 mg (10 mg Intramuscular Given 12/15/21 1137)  ketorolac (TORADOL) 30 MG/ML injection 30 mg (30 mg Intramuscular Given 12/15/21 1137)  HYDROmorphone (DILAUDID) injection 1 mg (1 mg Intramuscular Given 12/15/21 1342)    ED Course/ Medical Decision Making/ A&P                           Medical Decision Making Risk Prescription drug management.  Patient presents to the ED with complaints of low back pain, pain down the right leg. This involves an extensive number of treatment options, and is a complaint that carries with it a low risk of complications and morbidity.   Additional history obtained:  Additional history obtained from: Wife External records from outside source obtained and reviewed including: Previous emergency department visit  Medications  I ordered medication including Decadron, Toradol, Dilaudid for pain and inflammation Reevaluation of the patient after medication shows that patient improved  Tests Considered: CT lumbar spine  ED Course: 61 year old male who presents emergency department with right-sided low back pain that radiates down the right buttock all the way to the right toes causing numbness and tingling in the toes.  He has a positive straight leg raise on exam.  Given Toradol and Decadron with improvement in his symptoms.  I stood him up to ambulate.  He was able to do this with a cane briefly and then had return of pain symptoms.  He was given IM Dilaudid with improvement in symptoms.   No back pain red flags.  Problem List:  Low back pain This is likely sciatica given positive straight leg raise, no back pain red flags. No indication for imaging at this time Given IM medication as well as steroids with moderate relief of symptoms. Will give short course of steroids due to diabetes as well as pain medication and [facility] outpatient follow-up. He understands return for cauda equina symptoms, red flags that we have reviewed at bedside.  No  further questions.  Dispostion:  After consideration of the diagnostic results and the patients response to treatment, I feel that the patent would benefit from discharge.  Considered but do not think patient needs admission.  He is ambulatory.  No back pain red flags.   Final Clinical Impression(s) / ED Diagnoses Final diagnoses:  Sciatica of right side    Rx / DC Orders ED Discharge Orders          Ordered    predniSONE (DELTASONE) 20 MG tablet  Daily        12/15/21 1352    oxyCODONE-acetaminophen (PERCOCET/ROXICET) 5-325 MG tablet  Every 8 hours PRN        12/15/21 1352              Mickie Hillier, PA-C 12/15/21 1519    Long, [facility], MD 12/21/21 2015

## 2021-12-15 NOTE — Discharge Instructions (Signed)
You were seen in the emergency department today for sciatica.  While you are here we gave you a shot of steroids as well as pain medication.  I am prescribing you a very short course of steroids because you have diabetes.  Monitor your sugars more frequently at home.  Additionally I prescribed you pain medication to take at home.  I have information in your discharge paperwork for [facility] who may be able to more definitively help manage your symptoms.  Please call them today for follow-up.

## 2021-12-15 NOTE — ED Triage Notes (Signed)
Pt here from home with c/o back pain was seen 5 days for same but has not seen his follow up yet due to workman's comp

## 2022-02-07 ENCOUNTER — Encounter: Payer: Self-pay | Admitting: Internal Medicine

## 2022-02-07 DIAGNOSIS — G4719 Other hypersomnia: Secondary | ICD-10-CM

## 2022-02-10 NOTE — Procedures (Signed)
? ?[facility]  ?Polysomnogram Report ?Part I ? ? ?   ?                                                           Phone: [phone] ?Fax: [phone] ? ?Patient Name: Christian Hubbard, Christian Hubbard Acquisition Number: [id number]  ?Date of Birth: 1960-12-10 Acquisition Date: 02/07/2022  ?Referring Physician: Dimitri Ped, MD    ? ?History: The patient is a 61 year old male who was referred for reevaluation of obstructive sleep apnea. Medical History: back issues, depression, diabetes and OSA. ? ?Medications: losartan, pravastatin, amlodipine, omeprazole and metformin. ? ?Procedure: This routine overnight polysomnogram was performed on the [facility] using the standard diagnostic protocol. This included 6 channels of EEG, 2 channels of EOG, chin EMG, bilateral anterior tibialis EMG, nasal/oral thermistor, PTAF (nasal pressure transducer), chest and abdominal wall movements, EKG, and pulse oximetry. ? ?Description: The total recording time was 378.0 minutes. The total sleep time was 260.0 minutes. There were a total of 106.8 minutes of wakefulness after sleep onset for areducedsleep efficiency of 68.8%. The latency to sleep onset was within normal limits at 11.2 minutes. The R sleep onset latency was prolonged at 194.5 minutes. Sleep parameters, as a percentage of the total sleep time, demonstrated 45.0% of sleep was in N1 sleep, 32.5% N2, 5.6% N3 and 16.9% R sleep. There were a total of 466 arousals for an arousal index of 107.5 arousals per hour of sleep that was elevated. ? ?Respiratory monitoring demonstrated nearly continuous moderate to severe degree of snoring in all positions. There were 433 apneas and hypopneas for an Apnea Hypopnea Index of 99.9 apneas and hypopneas per hour of sleep. The REM related apnea hypopnea index was 117.3/hr of REM sleep compared to a NREM AHI of 93.9/hr.  The average duration of the respiratory events was 19.3 seconds with a maximum duration of 41.5 seconds. The respiratory events  occurred in all positions, however they were much more severe in the supine position with an AHI of 123.5. The respiratory events were associated with peripheral oxygen desaturations on the average to 86%. The lowest oxygen desaturation associated with a respiratory event was 72%. Additionally, the baseline oxygen saturation during wakefulness was 95%, during NREM sleep averaged 94%, and during REM sleep averaged 92%. The total duration of oxygen < 90% was 42.5 minutes and <80% was 0.8 minutes. ? ?Cardiac monitoring- did not demonstrate transient cardiac decelerations associated with the apneas. There were no significant cardiac rhythm irregularities.  ? ?Periodic limb movement monitoring- demonstrated that there were 91 periodic limb movements for a periodic limb movement index of 21.0 periodic limb movements per hour of sleep. Quasi-periodic limb movements were observed during periods of wakefulness. ? ? ?Impression: ?This routine overnight polysomnogram confirms the presence of very severe obstructive sleep apnea with an overall Apnea Hypopnea Index of 99.9 apneas and hypopneas per hour of sleep. The respiratory events were more severe in REM sleep with an AHI of 123.5 with the lowest desaturation to 72%.   ? ?There was a significantly elevated periodic limb movement index of 21.0 periodic limb movements per hour of sleep. In addition, quasi-periodic limb movements were observed during periods of wakefulness. Sometimes these limb movements subside once the apnea is controlled. Clinical correlation is suggested. ? ?There  was a reduced sleep efficiency with an elevated arousal index,increased awakeningsand reduced percentages of REM and slow wave sleep.These findings would appear to be due to the combination of obstructive sleep apnea and periodic limb movements.  ? ?Recommendations:    ?A CPAP titration would be recommended due to the severity of the sleep apnea. Some supine sleep should be ensured to optimize the  titration. ?Additionally, would recommend weight loss in a patient with a BMI of 38.5.  ? ? ? ?Allyne Gee, MD, FCCP ?Diplomate ABMS-Pulmonary, Critical Care and Sleep Medicine  ?Electronically reviewed and digitally signed ? ?[facility] ?Polysomnogram Report ?Part II ? ?Phone: [phone] ?Fax: [phone] ? ?Patient last name Hubbard Neck Size 19.0 in. Acquisition [id number]  ?Patient first name Christian Weight 300.0 lbs. Started 02/07/2022 at 11:33:50 PM  ?Birth date June 13, 1961 Height 74.0 in. Stopped 02/08/2022 at 6:09:14 AM  ?Age 52 BMI 38.5 lb/in2 Duration 378.0  ?Study Type Adult      ?Report generated by Paulo Fruit. Reviewed by: Richelle Ito Saunders Glance, PhD, ABSM, FAASM ?Sleep Data: ?Lights Out: 11:42:38 PM Sleep Onset: 11:53:50 PM  ?Lights On: 6:00:38 AM Sleep Efficiency: 68.8 %  ?Total Recording Time: 378.0 min Sleep Latency (from Lights Off) 11.2 min  ?Total Sleep Time (TST): 260.0 min R Latency (from Sleep Onset): 194.5 min  ?Sleep Period Time: 339.0 min Total number of awakenings: 58  ?Wake during sleep: 79.0 min Wake After Sleep Onset (WASO): 106.8 min  ? ?Sleep Data:         Arousal Summary: ?Stage  ?Latency from lights out (min) Latency from sleep onset (min) Duration (min) % Total Sleep Time  ?Normal values  ?N 1 11.2 0.0 117.0 45.0 (5%)  ?N 2 51.2 40.0 84.5 32.5 (50%)  ?N 3 70.2 59.0 14.5 5.6 (20%)  ?R 205.7 194.5 44.0 16.9 (25%)  ? ? Number Index  ?Spontaneous 56 12.9  ?Apneas & Hypopneas 403 93.0  ?RERAs 0 0.0  ?     (Apneas & Hypopneas & RERAs)  (403) (93.0)  ?Limb Movement 6 1.4  ?Snore 1 0.2  ?TOTAL 466 107.5  ? ? ? ?Respiratory Data: ? CA OA MA Apnea Hypopnea* A+ H RERA Total  ?Number 0 145 123 268 165 433 0 433  ?Mean Dur (sec) 0.0 18.6 21.1 19.7 18.5 19.3 0.0 19.3  ?Max Dur (sec) 0.0 25.5 41.5 41.5 35.0 41.5 0.0 41.5  ?Total Dur (min) 0.0 44.9 43.2 88.1 50.9 139.1 0.0 139.1  ?% of TST 0.0 17.3 16.6 33.9 19.6 53.5 0.0 53.5  ?Index (#/h TST) 0.0 33.5 28.4 61.8 38.1 99.9 0.0 99.9  ?*Hypopneas  scored based on 4% or greater desaturation. ? ?Sleep Stage:       ? REM NREM TST  ?AHI 117.3 93.9 99.9  ?RDI 117.3 93.9 99.9  ? ? ? ? ? ? ? ? ? ?Body Position Data: ? Sleep ?(min) TST ?(%) REM ?(min) NREM ?(min) CA ?(#) OA ?(#) MA ?(#) HYP ?(#) AHI ?(#/h) RERA ?(#) RDI ?(#/h) Desat ?(#)  ?Supine 205.0 78.85 44.0 161.0 0 144 123 155 123.5 0 123.5 435  ?Non-Supine 55.00 21.15 0.00 55.00 0.00 1.00 0.00 10.00 12.00 0 12.00 12.00  ?Left: 0.0 0.00 0.0 0.0 0 0 0 0 0.0 0 0.00 0  ?Right: 55.0 21.15 0.0 55.0 0 1 0 10 12.0 0 12.0 12  ?  ? ?Snoring: ?Total number of snoring episodes  1  ?Total time with snoring 0.1 min (0.0 % of  sleep)  ? ?Oximetry Distribution: ?            WK REM NREM TOTAL  ?Average (%)   95 92 94 94  ?< 90% 5.1 13.8 23.6 42.5  ?< 80% 0.3 0.1 0.4 0.8  ?< 70% 0.0 0.0 0.0 0.0  ?# of Desaturations* 95 28 413 244  ?Desat Index (#/hour) 15.8 124.1 90.3 103.2  ?Desat Max (%) '12 24 18 24  '$ ?Desat Max Dur (sec) 36.0 48.0 77.0 77.0  ?Approx Min O2 during sleep 72  ?Approx min O2 during a respiratory event 72  ?Was Oxygen added (Y/N) and final rate No:   0 LPM  ?*Desaturations based on 3% or greater drop from baseline. ? ? Cheyne Stokes Breathing: None Present  ? ?Heart Rate Summary:  ?Average Heart Rate During Sleep 71.0 bpm      ?Highest Heart Rate During Sleep (95th %) 79.0 bpm      ?Highest Heart Rate During Sleep 127 bpm      ?Highest Heart Rate During Recording (TIB) 205 bpm (artifact)  ? ?Heart Rate Observations: ?Event Type # Events   ?Bradycardia 0 Lowest HR Scored: N/A  ?Sinus Tachycardia During Sleep 0 Highest HR Scored: N/A  ?Narrow Complex Tachycardia 0 Highest HR Scored: N/A  ?Wide Complex Tachycardia 0 Highest HR Scored: N/A  ?Asystole 0 Longest Pause: N/A  ?Atrial Fibrillation 0 Duration Longest Event: N/A  ?Other Arrythmias  No Type:   ? ?Periodic Limb Movement Data: (Primary legs unless otherwise noted) ?Total # Limb Movement 94 Limb Movement Index 21.7  ?Total # PLMS 91 PLMS Index 21.0  ?Total # PLMS  Arousals 4 PLMS Arousal Index 0.9  ?Percentage Sleep Time with PLMS 40.94mn (15.5 % sleep)  ?Mean Duration limb movements (secs) 2414.0  ? ? ? ? ? ? ?

## 2022-02-27 ENCOUNTER — Ambulatory Visit: Payer: Self-pay | Admitting: Orthopedic Surgery

## 2022-03-06 NOTE — Pre-Procedure Instructions (Addendum)
Surgical Instructions ? ? ? Your procedure is scheduled on Thursday 03/16/22. ? ? Report to [facility] Main Entrance "A" at 08:45 A.M., then check in with the Admitting office. ? Call this number if you have problems the morning of surgery: ? [phone] ? ? If you have any questions prior to your surgery date call [phone]: Open Monday-Friday 8am-4pm ? ? ? Remember: ? Do not eat after midnight the night before your surgery ? ?You may drink clear liquids until 07:45 A.M the morning of your surgery.   ?Clear liquids allowed are: Water, Non-Citrus Juices (without pulp), Carbonated Beverages, Clear Tea, Black Coffee ONLY (NO MILK, CREAM OR POWDERED CREAMER of any kind), and Gatorade ? ?Patient Instructions ? ?The night before surgery:  ?No food after midnight. ONLY clear liquids after midnight ? ? ?The day of surgery (if you have diabetes): ?Drink ONE (1) 12 oz G2 given to you in your pre admission testing appointment by 07:45 a.m. the morning of surgery. Drink in one sitting. Do not sip.  ?This drink was given to you during your hospital  ?pre-op appointment visit.  ?Nothing else to drink after completing the  ?12 oz bottle of G2. ? ?       If you have questions, please contact your surgeon?s office. ? ?  ? Take these medicines the morning of surgery with A SIP OF WATER:  ? amLODipine (NORVASC) ? omeprazole (PRILOSEC) ? pravastatin (PRAVACHOL)  ? ? Take these medicines if needed:  ? oxyCODONE-acetaminophen (PERCOCET/ROXICET) ? Propylene Glycol (SYSTANE BALANCE) ? ? ?As of today, STOP taking any Aspirin (unless otherwise instructed by your surgeon) Aleve, Naproxen, Ibuprofen, Motrin, Advil, Goody's, BC's, all herbal medications, fish oil, and all vitamins. ? ?        WHAT DO I DO ABOUT MY DIABETES MEDICATION? ? ? ?Do not take oral diabetes medicines (pills) the morning of surgery. ? ?DO NOT TAKE metFORMIN (GLUCOPHAGE) the morning of surgery.  ? ?DO NOT TAKE OZEMPIC the morning of surgery.  ? ?The day of surgery, do  not take other diabetes injectables, including Byetta (exenatide), Bydureon (exenatide ER), Victoza (liraglutide), or Trulicity (dulaglutide). ? ? ?HOW TO MANAGE YOUR DIABETES ?BEFORE AND AFTER SURGERY ? ?Why is it important to control my blood sugar before and after surgery? ?Improving blood sugar levels before and after surgery helps healing and can limit problems. ?A way of improving blood sugar control is eating a healthy diet by: ? Eating less sugar and carbohydrates ? Increasing activity/exercise ? Talking with your doctor about reaching your blood sugar goals ?High blood sugars (greater than 180 mg/dL) can raise your risk of infections and slow your recovery, so you will need to focus on controlling your diabetes during the weeks before surgery. ?Make sure that the doctor who takes care of your diabetes knows about your planned surgery including the date and location. ? ?How do I manage my blood sugar before surgery? ?Check your blood sugar at least 4 times a day, starting 2 days before surgery, to make sure that the level is not too high or low. ? ?Check your blood sugar the morning of your surgery when you wake up and every 2 hours until you get to the Short Stay unit. ? ?If your blood sugar is less than 70 mg/dL, you will need to treat for low blood sugar: ?Do not take insulin. ?Treat a low blood sugar (less than 70 mg/dL) with ? cup of clear juice (cranberry or apple), 4 glucose  tablets, OR glucose gel. ?Recheck blood sugar in 15 minutes after treatment (to make sure it is greater than 70 mg/dL). If your blood sugar is not greater than 70 mg/dL on recheck, call [phone] for further instructions. ?Report your blood sugar to the short stay nurse when you get to Short Stay. ? ?If you are admitted to the hospital after surgery: ?Your blood sugar will be checked by the staff and you will probably be given insulin after surgery (instead of oral diabetes medicines) to make sure you have good blood sugar  levels. ?The goal for blood sugar control after surgery is 80-180 mg/dL.  ?Do not wear jewelry or makeup ?Do not wear lotions, powders, perfumes/colognes, or deodorant. ?Do not shave 48 hours prior to surgery.  Men may shave face and neck. ?Do not bring valuables to the hospital. ?Do not wear nail polish, gel polish, artificial nails, or any other type of covering on natural nails (fingers and toes) ?If you have artificial nails or gel coating that need to be removed by a nail salon, please have this removed prior to surgery. Artificial nails or gel coating may interfere with anesthesia's ability to adequately monitor your vital signs. ? ?[facility] is not responsible for any belongings or valuables. .  ? ?Do NOT Smoke (Tobacco/Vaping)  24 hours prior to your procedure ? ?If you use a CPAP at night, you may bring your mask for your overnight stay. ?  ?Contacts, glasses, hearing aids, dentures or partials may not be worn into surgery, please bring cases for these belongings ?  ?For patients admitted to the hospital, discharge time will be determined by your treatment team. ?  ?Patients discharged the day of surgery will not be allowed to drive home, and someone needs to stay with them for 24 hours. ? ? ?SURGICAL WAITING ROOM VISITATION ?Patients having surgery or a procedure in a hospital may have two support people. ?Children under the age of 66 must have an adult with them who is not the patient. ?They may stay in the waiting area during the procedure and may switch out with other visitors. If the patient needs to stay at the hospital during part of their recovery, the visitor guidelines for inpatient rooms apply. ? ?Please refer to the [facility] website for the visitor guidelines for Inpatients (after your surgery is over and you are in a regular room).  ? ? ? ? ? ?Special instructions:   ? ?Oral Hygiene is also important to reduce your risk of infection.  Remember - BRUSH YOUR TEETH THE MORNING OF SURGERY WITH  YOUR REGULAR TOOTHPASTE ? ? ?[facility]- Preparing For Surgery ? ?Before surgery, you can play an important role. Because skin is not sterile, your skin needs to be as free of germs as possible. You can reduce the number of germs on your skin by washing with CHG (chlorahexidine gluconate) Soap before surgery.  CHG is an antiseptic cleaner which kills germs and bonds with the skin to continue killing germs even after washing.   ? ? ?Please do not use if you have an allergy to CHG or antibacterial soaps. If your skin becomes reddened/irritated stop using the CHG.  ?Do not shave (including legs and underarms) for at least 48 hours prior to first CHG shower. It is OK to shave your face. ? ?Please follow these instructions carefully. ?  ? ? Shower the NIGHT BEFORE SURGERY and the MORNING OF SURGERY with CHG Soap.  ? If you chose to  wash your hair, wash your hair first as usual with your normal shampoo. After you shampoo, rinse your hair and body thoroughly to remove the shampoo.  Then [employer] and genitals (private parts) with your normal soap and rinse thoroughly to remove soap. ? ?After that Use CHG Soap as you would any other liquid soap. You can apply CHG directly to the skin and wash gently with a scrungie or a clean washcloth.  ? ?Apply the CHG Soap to your body ONLY FROM THE NECK DOWN.  Do not use on open wounds or open sores. Avoid contact with your eyes, ears, mouth and genitals (private parts). Wash Face and genitals (private parts)  with your normal soap.  ? ?Wash thoroughly, paying special attention to the area where your surgery will be performed. ? ?Thoroughly rinse your body with warm water from the neck down. ? ?DO NOT shower/wash with your normal soap after using and rinsing off the CHG Soap. ? ?Pat yourself dry with a CLEAN TOWEL. ? ?Wear CLEAN PAJAMAS to bed the night before surgery ? ?Place CLEAN SHEETS on your bed the night before your surgery ? ?DO NOT SLEEP WITH PETS. ? ? ?Day of Surgery: ? ?Take  a shower with CHG soap. ?Wear Clean/Comfortable clothing the morning of surgery ?Do not apply any deodorants/lotions.   ?Remember to brush your teeth WITH YOUR REGULAR TOOTHPASTE. ? ? ? ?If you received

## 2022-03-07 ENCOUNTER — Encounter (HOSPITAL_COMMUNITY)
Admission: RE | Admit: 2022-03-07 | Discharge: 2022-03-07 | Disposition: A | Discharge status: Home / Self Care | Payer: Worker's Compensation | Source: Ambulatory Visit | Attending: Orthopedic Surgery | Admitting: Orthopedic Surgery

## 2022-03-07 ENCOUNTER — Encounter (HOSPITAL_COMMUNITY)
Admission: RE | Admit: 2022-03-07 | Discharge: 2022-03-07 | Disposition: A | Discharge status: Home / Self Care | Payer: Worker's Compensation | Source: Ambulatory Visit | Attending: Specialist | Admitting: Specialist

## 2022-03-07 ENCOUNTER — Other Ambulatory Visit: Payer: Self-pay

## 2022-03-07 ENCOUNTER — Encounter (HOSPITAL_COMMUNITY): Payer: Self-pay

## 2022-03-07 VITALS — BP 141/89 | HR 72 | Temp 98.4°F | Resp 18 | Ht 74.0 in | Wt 313.5 lb

## 2022-03-07 DIAGNOSIS — M5126 Other intervertebral disc displacement, lumbar region: Secondary | ICD-10-CM | POA: Diagnosis not present

## 2022-03-07 DIAGNOSIS — Z01818 Encounter for other preprocedural examination: Secondary | ICD-10-CM | POA: Diagnosis present

## 2022-03-07 DIAGNOSIS — E119 Type 2 diabetes mellitus without complications: Secondary | ICD-10-CM | POA: Insufficient documentation

## 2022-03-07 HISTORY — DX: Male erectile dysfunction, unspecified: N52.9

## 2022-03-07 HISTORY — DX: Other diseases of mediastinum, not elsewhere classified: J98.59

## 2022-03-07 HISTORY — DX: Type 2 diabetes mellitus without complications: E11.9

## 2022-03-07 HISTORY — DX: Depression, unspecified: F32.A

## 2022-03-07 LAB — SURGICAL PCR SCREEN
MRSA, PCR: NEGATIVE
Staphylococcus aureus: NEGATIVE

## 2022-03-07 LAB — CBC
HCT: 48.4 % (ref 39.0–52.0)
Hemoglobin: 15.3 g/dL (ref 13.0–17.0)
MCH: 27 pg (ref 26.0–34.0)
MCHC: 31.6 g/dL (ref 30.0–36.0)
MCV: 85.4 fL (ref 80.0–100.0)
Platelets: 263 10*3/uL (ref 150–400)
RBC: 5.67 MIL/uL (ref 4.22–5.81)
RDW: 14.2 % (ref 11.5–15.5)
WBC: 6 10*3/uL (ref 4.0–10.5)
nRBC: 0 % (ref 0.0–0.2)

## 2022-03-07 LAB — HEMOGLOBIN A1C
Hgb A1c MFr Bld: 5.8 % — ABNORMAL HIGH (ref 4.8–5.6)
Mean Plasma Glucose: 119.76 mg/dL

## 2022-03-07 LAB — GLUCOSE, CAPILLARY: Glucose-Capillary: 99 mg/dL (ref 70–99)

## 2022-03-07 NOTE — Progress Notes (Signed)
PCP - Dr. Maury Dus at [facility] ?Cardiologist - denies ? ?PPM/ICD - n/a ? ?Chest x-ray - n/a ?EKG - 03/07/22 ?Stress Test - denies ?ECHO - 07/01/17 ?Cardiac Cath - denies ? ?Sleep Study - +OSA. Wears CPAP nightly. Does not know pressure settings.  ?  ?CBG today- 99 ?Checks blood sugar twice a day. ?Range 85-130 ? ?Blood Thinner Instructions: n/a ?Aspirin Instructions: n/a ? ?ERAS Protcol - Yes. Clear Liquids until 07:45 A.M. day of surgery ?PRE-SURGERY Ensure or G2- G2  ? ?COVID TEST- n/a ? ?DG Lumbar Spine done at PAT appointment ? ?Anesthesia review: Yes. EKG Review ? ?Patient denies shortness of breath, fever, cough and chest pain at PAT appointment ? ? ?All instructions explained to the patient, with a verbal understanding of the material. Patient agrees to go over the instructions while at home for a better understanding. Patient also instructed to self quarantine after being tested for COVID-19. The opportunity to ask questions was provided. ? ? ?

## 2022-03-07 NOTE — Progress Notes (Signed)
Received a call from the lab that patient's BMP drawn earlier today clotted and will have to be redrawn day of surgery.  ?

## 2022-03-08 NOTE — Progress Notes (Addendum)
Anesthesia Chart Review: ? Case: [id number] Date/Time: 03/16/22 1030  ? Procedure: Microlumbar decompression Microdiscectomy L4-5, L5-S1  ? Anesthesia type: General  ? 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 Location: [facility] ROOM 04 / [facility]  ? Surgeons: Susa Day, MD  ? ?  ? ? ?DISCUSSION: Patient is a 61 year old male scheduled for the above procedure. ? ?History includes former smoker (quit 11/27/04), HTN, HLD, GERD, DM2, OSA (uses CPAP), umbilical hernia repair (09/17/19), bronchoscopy (07/02/17: no malignancy). BMI is consistent with morbid obesity.  ? ?Medical clearance for procedure obtained from PCP Dr. Alyson Ingles.  He recommended holding metformin for 48 hours before and after surgery. ? ?Patient's BMET specimen clotted on 03/07/22. Notified Sherry at Dr. Reather Littler office. Drawing BMET prior to the day of surgery is preferred if patent able. Our scheduler will reach out to Mr. Brem. ? ?ADDENDUM 03/10/22 2:46 PM: Mr. Court had a BMET drawn on 03/10/22.  Labs appear acceptable for OR from an anesthesia standpoint.  Results were normal other than a random glucose of 127. Will route labs to Dr. Tonita Cong.  Anesthesia team to evaluate on the day of surgery. ?  ? ?VS: BP (!) 141/89   Pulse 72   Temp 36.9 ?C (Oral)   Resp 18   Ht '6\' 2"'$  (1.88 m)   Wt (!) 142.2 kg   SpO2 99%   BMI 40.25 kg/m?  ? ? ?PROVIDERS: ?Maury Dus, MD is PCP [facility]) ?Melodie Bouillon, MD is CT surgery. 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 He was offered robotic assisted thymectomy for diagnostic purposes, but patient preferred to continue surveillance so 1 year follow-up with CT scan of the chest planned. ?- Devona Konig, MD is pulmonologist (OSA). Previously saw Kara Mead, MD for right PNA 2014 and 2018. ?- Carol Ada MD is GI ? ? ?LABS: Preoperative CBC and A1c noted.  CBC normal. A1c 5.8%. 03/07/22 BMET specimen clotted.  ?(all labs ordered are listed, but only  abnormal results are displayed) ? ?Labs Reviewed  ?HEMOGLOBIN A1C - Abnormal; Notable for the following components:  ?    Result Value  ? Hgb A1c MFr Bld 5.8 (*)   ? All other components within normal limits  ?SURGICAL PCR SCREEN  ?GLUCOSE, CAPILLARY  ?CBC  ?See DISCUSSION regarding 03/10/22 BMET results.  ? ? ?Sleep Study 02/07/22: ?Impression: ?- This routine overnight polysomnogram confirms the presence of very severe obstructive sleep apnea with an overall Apnea Hypopnea Index of 99.9 apneas and hypopneas per hour of sleep. The respiratory events were more severe in REM sleep with an AHI of 123.5 with the lowest desaturation to 72%.   ?- There was a significantly elevated periodic limb movement index of 21.0 periodic limb movements per hour of sleep. In addition, quasi-periodic limb movements were observed during periods of wakefulness. Sometimes these limb movements subside once the apnea is controlled. Clinical correlation is suggested. ?- There was a reduced sleep efficiency with an elevated arousal index,increased awakeningsand reduced percentages of REM and slow wave sleep.These findings would appear to be due to the combination of obstructive sleep apnea and periodic limb movements.  ?Recommendations:    ?A CPAP titration would be recommended due to the severity of the sleep apnea. Some supine sleep should be ensured to optimize the titration. ?Additionally, would recommend weight loss in a patient with a BMI of 38.5.  ?  ? ?IMAGES: ?Xray L-spine 03/07/22: ?IMPRESSION: ?1. Stable radiographic appearance  of the lumbar spine. Multilevel ?degenerative change. Remote L2 superior endplate compression ?deformity. ?2. There are 5 lumbar-type vertebral bodies, images were numbered. ?  ?CT Chest 07/15/21: ?IMPRESSION: ?1. Thin walled cavitary lesion in the right lower lobe is unchanged ?in size, measuring 4.3 x 3.9 cm, previously 4.3 x 3.8 cm. Increased ?soft tissue nodularity versus layering debris along  the ?posteroinferior margin of the cavitation may be ?infectious/inflammatory, but neoplasm is not excluded. Previously ?described soft tissue nodularity along the superior margin is ?unchanged. Short-term interval follow-up is recommended. ?2. Aortic Atherosclerosis (ICD10-I70.0). ?  ?PET Scan 10/14/19: ?IMPRESSION: ?1. Again seen is nodular soft tissue infiltration within the ?anterior mediastinal fat ventral to the ascending aorta and anterior ?arch of the aorta. There is corresponding mild increased FDG uptake ?on the PET images with SUV max between 3.15 and 3.8. Anatomically, ?this appears similar to exam from 07/11/17. Compared with 04/16/2015 ?this is a new abnormality. Findings are favored to represent ?low-grade indolent process with differential considerations in ?adults including: Lymphoproliferative disorder, adenopathy, versus ?thymoma. ?2. Right lower lobe pneumatocele and focal emphysematous changes in ?the right middle lobe are again noted and appear similar to ?04/16/2015. ?3. There is skin thickening and inflammatory changes within the ?periumbilical ventral abdominal wall. There is an underlying soft ?tissue nodule deep to the umbilicus measuring 2.9 cm. Moderate ?increased uptake localizing to this nodule has an SUV max of 5.31. ?Favor inflammatory/infectious process. Advise follow-up imaging in 3 ?months to an sure stability of this soft tissue nodule. If ?progressive consider percutaneous tissue sampling to exclude ?underlying malignancy. ?  ? ? ?EKG: 03/07/22: ?Normal sinus rhythm ?Minimal voltage criteria for LVH, may be normal variant ( R in aVL ) ?Possible Anterior infarct , age undetermined ?Abnormal ECG ?When compared with ECG of 12-Sep-2019 08:47, ?No significant change was found ?Confirmed by Nelva Bush [phone]) on 03/08/2022 6:54:28 AM ? ? ?CV: ?Echo 07/11/17: ?Study Conclusions  ?- Left ventricle: The cavity size was normal. Wall thickness was  ?  increased in a pattern of mild LVH.  Systolic function was normal.  ?  The estimated ejection fraction was in the range of 55% to 60%.  ?  Wall motion was normal; there were no regional wall motion  ?  abnormalities. Left ventricular diastolic function parameters  ?  were normal.  ?- Ventricular septum: D-shaped interventricular septum suggestive  ?  of RV pressure/volume overload.  ?- Aortic valve: There was no stenosis.  ?- Aorta: Mildly dilated aortic root. Aortic root dimension: 40 mm  ?  (ED).  ?- Mitral valve: There was no significant regurgitation.  ?- Right ventricle: The cavity size was mildly dilated. Systolic  ?  function was normal.  ?- Pulmonary arteries: No complete TR doppler jet so unable to  ?  estimate PA systolic pressure.  ? ?Impressions:  ?- Normal LV size with mild LV hypertrophy. EF 55-60%. Normal  ?  diastolic function. Mildly dilated RV with normal systolic  ?  function. D-shaped interventricular septum suggestive of RV  ?  pressure/volume overload. Unable to estimate PA systolic  ?  pressure.  ? ? ?Past Medical History:  ?Diagnosis Date  ? Depression   ? Diabetes mellitus without complication ([place])   ? Erectile dysfunction   ? GERD (gastroesophageal reflux disease)   ? Hyperlipemia   ? Hypertension   ? Mediastinal mass   ? Followed by Dr. Kipp Brood, stable 06/2021, he preferred surveillance imaging over resection for diagnostic purposes (07/15/21)  ? OSA on  CPAP   ? Pneumonia 08/10/2014; 07/10/2017  ? ? ?Past Surgical History:  ?Procedure Laterality Date  ? LIPOMA EXCISION Left 09/17/2019  ? Procedure: Excision of Left cheek lipoma;  Surgeon: Wallace Going, DO;  Location: [place];  Service: Plastics;  Laterality: Left;  ? UMBILICAL HERNIA REPAIR N/A 09/17/2019  ? Procedure: UMBILICAL HERNIA REPAIR WITH MESH;  Surgeon: Jovita Kussmaul, MD;  Location: [place];  Service: General;  Laterality: N/A;  ? VIDEO BRONCHOSCOPY Bilateral 07/02/2017  ? Procedure: VIDEO BRONCHOSCOPY WITH FLUORO;  Surgeon:  Rigoberto Noel, MD;  Location: [facility];  Service: Cardiopulmonary;  Laterality: Bilateral;  ? ? ?MEDICATIONS: ? Semaglutide (OZEMPIC, 0.25 OR 0.5 MG/DOSE, [place])  ? amLODipine (NORVASC) 5 MG tablet  ? losartan-hydrochlorot

## 2022-03-09 ENCOUNTER — Encounter (HOSPITAL_COMMUNITY): Payer: Self-pay

## 2022-03-10 ENCOUNTER — Encounter (HOSPITAL_COMMUNITY)
Admission: RE | Admit: 2022-03-10 | Discharge: 2022-03-10 | Disposition: A | Discharge status: Home / Self Care | Payer: 59 | Source: Ambulatory Visit | Attending: Specialist | Admitting: Specialist

## 2022-03-10 DIAGNOSIS — Z01818 Encounter for other preprocedural examination: Secondary | ICD-10-CM

## 2022-03-10 DIAGNOSIS — Z01812 Encounter for preprocedural laboratory examination: Secondary | ICD-10-CM | POA: Diagnosis not present

## 2022-03-10 LAB — BASIC METABOLIC PANEL
Anion gap: 8 (ref 5–15)
BUN: 11 mg/dL (ref 6–20)
CO2: 26 mmol/L (ref 22–32)
Calcium: 9.2 mg/dL (ref 8.9–10.3)
Chloride: 103 mmol/L (ref 98–111)
Creatinine, Ser: 0.99 mg/dL (ref 0.61–1.24)
GFR, Estimated: >60 mL/min (ref >60)
Glucose, Bld: 127 mg/dL — ABNORMAL HIGH (ref 70–99)
Potassium: 3.6 mmol/L (ref 3.5–5.1)
Sodium: 137 mmol/L (ref 135–145)

## 2022-03-10 NOTE — Anesthesia Preprocedure Evaluation (Addendum)
Anesthesia Evaluation  ?Patient identified by MRN, date of birth, ID band ?Patient awake ? ? ? ?Reviewed: ?Allergy & Precautions, NPO status , Patient's Chart, lab work & pertinent test results ? ?Airway ?Mallampati: III ? ?TM Distance: >3 FB ?Neck ROM: Full ? ? ?Comment: Large neck circumference with redundant neck tissue Dental ?no notable dental hx. ? ?  ?Pulmonary ?sleep apnea (severe OSA) and Continuous Positive Airway Pressure Ventilation , former smoker,  ?  ?Pulmonary exam normal ?breath sounds clear to auscultation ? ? ? ? ? ? Cardiovascular ?hypertension, Pt. on medications ?Normal cardiovascular exam ?Rhythm:Regular Rate:Normal ? ?Mediastinal mass ? ?EKG: 03/07/22: ?Normal sinus rhythm ?Minimal voltage criteria for LVH, may be normal variant ( R in aVL ) ?Possible Anterior infarct , age undetermined ?Abnormal ECG ?When compared with ECG of 12-Sep-2019 08:47, ?No significant change was found ?Confirmed by Nelva Bush [phone]) on 03/08/2022 6:54:28 AM ?? ?? ?CV: ?Echo 07/11/17: ?Study Conclusions  ?- Left ventricle: The cavity size was normal. Wall thickness was  ???increased in a pattern of mild LVH. Systolic function was normal.  ???The estimated ejection fraction was in the range of 55% to 60%.  ???Wall motion was normal; there were no regional wall motion  ???abnormalities. Left ventricular diastolic function parameters  ???were normal.  ?- Ventricular septum: D-shaped interventricular septum suggestive  ???of RV pressure/volume overload.  ?- Aortic valve: There was no stenosis.  ?- Aorta: Mildly dilated aortic root. Aortic root dimension: 40 mm  ???(ED).  ?- Mitral valve: There was no significant regurgitation.  ?- Right ventricle: The cavity size was mildly dilated. Systolic  ???function was normal.  ?- Pulmonary arteries: No complete TR doppler jet so unable to  ???estimate PA systolic pressure.  ? ?Impressions:  ?- Normal LV size with mild LV hypertrophy. EF  55-60%. Normal  ???diastolic function. Mildly dilated RV with normal systolic  ???function. D-shaped interventricular septum suggestive of RV  ???pressure/volume overload. Unable to estimate PA systolic  ???pressure.  ?  ?Neuro/Psych ?PSYCHIATRIC DISORDERS   ? GI/Hepatic ?GERD  Medicated,  ?Endo/Other  ?diabetes, Type 2, Oral Hypoglycemic AgentsMorbid obesity ? Renal/GU ?  ? ?  ?Musculoskeletal ?negative musculoskeletal ROS ?(+)  ? Abdominal ?  ?Peds ?negative pediatric ROS ?(+)  Hematology ?  ?Anesthesia Other Findings ? ? Reproductive/Obstetrics ?negative OB ROS ? ?  ? ? ? ? ? ? ? ? ? ? ? ? ? ?  ?  ? ? ? ? ? ? ?Anesthesia Physical ?Anesthesia Plan ? ?ASA: 3 ? ?Anesthesia Plan: General  ? ?Post-op Pain Management: Tylenol PO (pre-op)* and Minimal or no pain anticipated  ? ?Induction: Intravenous ? ?PONV Risk Score and Plan: 2 and Treatment may vary due to age or medical condition, Midazolam, Dexamethasone and Ondansetron ? ?Airway Management Planned: Oral ETT and Video Laryngoscope Planned ? ?Additional Equipment: None ? ?Intra-op Plan:  ? ?Post-operative Plan: Extubation in OR ? ?Informed Consent: I have reviewed the patients History and Physical, chart, labs and discussed the procedure including the risks, benefits and alternatives for the proposed anesthesia with the patient or authorized representative who has indicated his/her understanding and acceptance.  ? ? ? ?Dental advisory given ? ?Plan Discussed with: Anesthesiologist, CRNA and Surgeon ? ?Anesthesia Plan Comments: (PAT note written by Myra Gianotti, PA-C. ?)  ? ? ? ? ? ?Anesthesia Quick Evaluation ? ?

## 2022-03-15 ENCOUNTER — Encounter: Payer: Self-pay | Admitting: Internal Medicine

## 2022-03-15 ENCOUNTER — Ambulatory Visit: Payer: Self-pay | Admitting: Orthopedic Surgery

## 2022-03-15 DIAGNOSIS — G4733 Obstructive sleep apnea (adult) (pediatric): Secondary | ICD-10-CM

## 2022-03-15 NOTE — H&P (Deleted)
  The note originally documented on this encounter has been moved the the encounter in which it belongs.  

## 2022-03-15 NOTE — H&P (Signed)
Christian Hubbard is an 61 y.o. male.   Chief Complaint: back and leg pain HPI: Reason for Visit: (normal) visit for: (back) Context: work injury; 11 weeks 12/09/21 Location (Lower Extremity): lower back pain Severity: pain level 8/10 Timing: constant Are you working? not at all Notes: Waiting on medical clearance for surgery Patient is waiting for medical clearance. That has been apparently obtained  Past Medical History:  Diagnosis Date   Depression    Diabetes mellitus without complication ([place])    Erectile dysfunction    GERD (gastroesophageal reflux disease)    Hyperlipemia    Hypertension    Mediastinal mass    Followed by Dr. Kipp Brood, stable 06/2021, he preferred surveillance imaging over resection for diagnostic purposes (07/15/21)   OSA on CPAP    Pneumonia 08/10/2014; 07/10/2017    Past Surgical History:  Procedure Laterality Date   LIPOMA EXCISION Left 09/17/2019   Procedure: Excision of Left cheek lipoma;  Surgeon: Wallace Going, DO;  Location: [place];  Service: Plastics;  Laterality: Left;   UMBILICAL HERNIA REPAIR N/A 09/17/2019   Procedure: UMBILICAL HERNIA REPAIR WITH MESH;  Surgeon: Jovita Kussmaul, MD;  Location: [place];  Service: General;  Laterality: N/A;   VIDEO BRONCHOSCOPY Bilateral 07/02/2017   Procedure: VIDEO BRONCHOSCOPY WITH FLUORO;  Surgeon: Rigoberto Noel, MD;  Location: [facility];  Service: Cardiopulmonary;  Laterality: Bilateral;    Family History  Problem Relation Age of Onset   Diabetes Mother    Asthma Sister    Social History:  reports that he quit smoking about 17 years ago. His smoking use included cigarettes. He has a 24.00 pack-year smoking history. He has never used smokeless tobacco. He reports current alcohol use of about 6.0 standard drinks per week. He reports that he does not use drugs.  Allergies: No Known Allergies  Current meds: amLODIPine 5 mg tablet CeleBREX 200 mg  capsule losartan 100 mg-hydrochlorothiazide 12.5 mg tablet metFORMIN 500 mg tablet Neurontin 300 mg capsule omeprazole 40 mg capsule,delayed release oxyCODONE-acetaminophen 5 mg-325 mg tablet pravastatin 40 mg tablet  Review of Systems  Constitutional: Negative.   HENT: Negative.    Eyes: Negative.   Respiratory: Negative.    Cardiovascular: Negative.   Gastrointestinal: Negative.   Endocrine: Negative.   Genitourinary: Negative.   Musculoskeletal:  Positive for back pain, gait problem and myalgias.  Skin: Negative.   Neurological:  Positive for weakness and numbness.  Hematological: Negative.   Psychiatric/Behavioral: Negative.     There were no vitals taken for this visit. Physical Exam Constitutional:      General: He is in acute distress.     Appearance: Normal appearance.  HENT:     Head: Normocephalic and atraumatic.     Right Ear: External ear normal.     Left Ear: External ear normal.     Nose: Nose normal.     Mouth/Throat:     Pharynx: Oropharynx is clear.  Eyes:     Conjunctiva/sclera: Conjunctivae normal.  Cardiovascular:     Rate and Rhythm: Normal rate and regular rhythm.     Pulses: Normal pulses.  Pulmonary:     Effort: Pulmonary effort is normal.     Breath sounds: Normal breath sounds.  Abdominal:     General: Bowel sounds are normal.  Musculoskeletal:     Cervical back: Normal range of motion.     Comments: Gait and Station: Appearance: ambulating with no assistive devices and antalgic gait.  Constitutional: General Appearance: healthy-appearing.  Psychiatric: Mood and Affect: active and alert.  Cardiovascular System: Edema Right: none; Dorsalis and posterior tibial pulses 2+. Edema Left: none.  Abdomen: Inspection and Palpation: non-distended and no tenderness.  Skin: Inspection and palpation: no rash.  Lumbar Spine: Inspection: normal alignment. Bony Palpation of the Lumbar Spine: tender at lumbosacral junction.. Bony Palpation of the  Right Hip: no tenderness of the greater trochanter and tenderness of the SI joint; Pelvis stable. Bony Palpation of the Left Hip: no tenderness of the greater trochanter and tenderness of the SI joint. Soft Tissue Palpation on the Right: No flank pain with percussion. Active Range of Motion: limited flexion and extention.  Motor Strength: L1 Motor Strength on the Right: hip flexion iliopsoas 5/5. L1 Motor Strength on the Left: hip flexion iliopsoas 5/5. L2-L4 Motor Strength on the Right: knee extension quadriceps 5/5. L2-L4 Motor Strength on the Left: knee extension quadriceps 5/5. L5 Motor Strength on the Right: ankle dorsiflexion tibialis anterior 4/5 and great toe extension extensor hallucis longus 4/5. L5 Motor Strength on the Left: ankle dorsiflexion tibialis anterior 5/5 and great toe extension extensor hallucis longus 5/5. S1 Motor Strength on the Right: plantar flexion gastrocnemius 5/5. S1 Motor Strength on the Left: plantar flexion gastrocnemius 5/5.  Neurological System: Knee Reflex Right: normal (2). Knee Reflex Left: normal (2). Ankle Reflex Right: normal (2). Ankle Reflex Left: normal (2). Babinski Reflex Right: plantar reflex absent. Babinski Reflex Left: plantar reflex absent. Sensation on the Right: normal distal extremities and dereased sensation on the sole of the foot and the posterior leg (S1). Sensation on the Left: normal distal extremities. Special Tests on the Right: no clonus of the ankle/knee and seated straight leg raising test positive. Special Tests on the Left: no clonus of the ankle/knee.  Skin:    General: Skin is warm and dry.  Neurological:     Mental Status: He is alert.    MRI demonstrates a large disc herniation central and paracentral to the right migrating to the L5-S1 disc base.  Assessment/Plan Impression:  1. L5-S1 radiculopathy secondary large extruded disc herniation L4-5 migrating caudad. Compressing the thecal sac and the right-sided nerve roots. Patient  with weakness in dorsiflexion. And plantarflexion. Decreased sensation the S1 dermatome  Plan:  Again given the presence of a large neural compressive lesion with neural tension signs and weakness and dysesthesias I rediscussed lumbar decompression Given that this is migrated to the L5-S1 disc space it would likely require excision of the L5 lamina  We discussed the risk and benefits of the procedure on the last visit.  He was only allotted 5 days of pain medicine A prescription for an opioid was given to be taken as directed for pain control. I discussed the risks including cognitive changes which may affect the ability to operate machinery and to drive. In addition the side effect of constipation as well as to avoid taking with conflicting medications that were discussed. The patient's prescription drug monitoring report was reviewed with no red flags noted.  Patient was given a prescription for gabapentin to be taken as directed. It is to be titrated for effect up to 3 times a day for neuropathic pain as needed. Starting with 1 a day for 3 days, then 2 a day for 3 days, then 3 times a day as needed. It is frequent that during the day the side effects are not tolerated and therefore taken only at night. Symptoms such as dizziness and being groggy.  Occasionally there is fluid retention and weight gain associated with this medication. When the pain subsides and the medication is no longer needed it should be discontinued by slowly weaning off the medication. If taking it 3 times a day then it should be decreased to 2 times a day for a week and then 1 time a day for a week and then as needed.  Patient was given a prescription for an anti-inflammatory or instructed to take over-the-counter anti-inflammatory medications. Side effects were discussed including potential elevation of blood pressure, long-term effect on the kidneys and liver, ulcer risks and therefore the need for appropriate monitoring  if taken continually. That would include regular blood chemistries by a primary care physician to evaluate kidney function as well as liver function and monitor for potential gastrointestinal effects. My preference is to utilize anti-inflammatories periodically and preemptively to reduce inflammation on a short-term basis. This would decrease the risks associated with long-term use of those medications. In addition anti-inflammatory medications should be taken with a meal. They should not be taken if a blood thinner is being used or the patient has a history of peptic ulcer disease.  I discussed these findings in front of the patient with their case manager. I summarized the encounter including the patient's complaints, progress to date, and current recommendations for future treatment and follow-up. In addition, we discussed work restrictions and the patient's full duty job description.  Patient is to call or present to the emergency room if there is any numbness or weakness to suggest worsening of their condition. In addition although rare if there is loss of bowel or bladder function such as incontinence or inability to void that this may represent a cauda equina syndrome. If noted the patient is to present immediately to the emergency room for evaluation and treatment otherwise permanent bowel or bladder dysfunction can occur as a result.  Given the patient has been cleared apparently today we will proceed with moving up his surgery. Keep him out of work in the interim.  I discussed these findings in front of the patient with their case manager. Wallene Huh I summarized the encounter including the patient's complaints, progress to date, and current recommendations for future treatment and follow-up. In addition, we discussed work restrictions and the patient's full duty job description.  Plan microlumbar decompression L4-5, L5-S1  Cecilie Kicks, PA-C for Dr. Tonita Cong 03/15/2022, 11:22  AM

## 2022-03-16 ENCOUNTER — Ambulatory Visit (HOSPITAL_BASED_OUTPATIENT_CLINIC_OR_DEPARTMENT_OTHER): Payer: Worker's Compensation | Admitting: General Practice

## 2022-03-16 ENCOUNTER — Ambulatory Visit (HOSPITAL_COMMUNITY): Payer: Worker's Compensation

## 2022-03-16 ENCOUNTER — Encounter (HOSPITAL_COMMUNITY): Payer: Self-pay | Admitting: Specialist

## 2022-03-16 ENCOUNTER — Ambulatory Visit (HOSPITAL_COMMUNITY): Payer: Worker's Compensation | Admitting: Vascular Surgery

## 2022-03-16 ENCOUNTER — Other Ambulatory Visit: Payer: Self-pay

## 2022-03-16 ENCOUNTER — Ambulatory Visit (HOSPITAL_COMMUNITY)
Admission: RE | Admit: 2022-03-16 | Discharge: 2022-03-17 | Disposition: A | Discharge status: Home / Self Care | Payer: Worker's Compensation | Attending: Specialist | Admitting: Specialist

## 2022-03-16 ENCOUNTER — Encounter (HOSPITAL_COMMUNITY)
Admission: RE | Disposition: A | Discharge status: Home / Self Care | Payer: Self-pay | Source: Home / Self Care | Attending: Specialist

## 2022-03-16 DIAGNOSIS — G4733 Obstructive sleep apnea (adult) (pediatric): Secondary | ICD-10-CM | POA: Insufficient documentation

## 2022-03-16 DIAGNOSIS — M5116 Intervertebral disc disorders with radiculopathy, lumbar region: Secondary | ICD-10-CM | POA: Diagnosis not present

## 2022-03-16 DIAGNOSIS — Z7984 Long term (current) use of oral hypoglycemic drugs: Secondary | ICD-10-CM | POA: Insufficient documentation

## 2022-03-16 DIAGNOSIS — M48061 Spinal stenosis, lumbar region without neurogenic claudication: Secondary | ICD-10-CM

## 2022-03-16 DIAGNOSIS — I10 Essential (primary) hypertension: Secondary | ICD-10-CM | POA: Insufficient documentation

## 2022-03-16 DIAGNOSIS — Z87891 Personal history of nicotine dependence: Secondary | ICD-10-CM | POA: Diagnosis not present

## 2022-03-16 DIAGNOSIS — E119 Type 2 diabetes mellitus without complications: Secondary | ICD-10-CM

## 2022-03-16 DIAGNOSIS — Z6839 Body mass index (BMI) 39.0-39.9, adult: Secondary | ICD-10-CM | POA: Diagnosis not present

## 2022-03-16 DIAGNOSIS — K219 Gastro-esophageal reflux disease without esophagitis: Secondary | ICD-10-CM | POA: Diagnosis not present

## 2022-03-16 DIAGNOSIS — M5126 Other intervertebral disc displacement, lumbar region: Secondary | ICD-10-CM

## 2022-03-16 DIAGNOSIS — G9589 Other specified diseases of spinal cord: Secondary | ICD-10-CM | POA: Diagnosis not present

## 2022-03-16 HISTORY — PX: LUMBAR LAMINECTOMY/DECOMPRESSION MICRODISCECTOMY: SHX5026

## 2022-03-16 LAB — GLUCOSE, CAPILLARY
Glucose-Capillary: 99 mg/dL (ref 70–99)
Glucose-Capillary: 102 mg/dL — ABNORMAL HIGH (ref 70–99)
Glucose-Capillary: 127 mg/dL — ABNORMAL HIGH (ref 70–99)
Glucose-Capillary: 117 mg/dL — ABNORMAL HIGH (ref 70–99)
Glucose-Capillary: 166 mg/dL — ABNORMAL HIGH (ref 70–99)
Glucose-Capillary: 145 mg/dL — ABNORMAL HIGH (ref 70–99)

## 2022-03-16 SURGERY — LUMBAR LAMINECTOMY/DECOMPRESSION MICRODISCECTOMY 2 LEVELS
Anesthesia: General | Site: Back

## 2022-03-16 MED ORDER — ONDANSETRON HCL 4 MG PO TABS: 4.0000 mg | ORAL_TABLET | Freq: Four times a day (QID) | ORAL | Status: DC | PRN

## 2022-03-16 MED ORDER — LACTATED RINGERS IV SOLN: INTRAVENOUS | Status: DC

## 2022-03-16 MED ORDER — SODIUM CHLORIDE 0.9 % IV SOLN
INTRAVENOUS | Status: DC | PRN
  Administered 2022-03-16: 2000 mg via TOPICAL

## 2022-03-16 MED ORDER — AMLODIPINE BESYLATE 5 MG PO TABS
5.0000 mg | ORAL_TABLET | Freq: Every day | ORAL | Status: DC
  Administered 2022-03-17: 5 mg via ORAL
  Filled 2022-03-16: qty 1

## 2022-03-16 MED ORDER — ROCURONIUM BROMIDE 10 MG/ML (PF) SYRINGE
PREFILLED_SYRINGE | INTRAVENOUS | Status: DC | PRN
  Administered 2022-03-16: 70 mg via INTRAVENOUS
  Administered 2022-03-16: 20 mg via INTRAVENOUS
  Administered 2022-03-16: 10 mg via INTRAVENOUS
  Administered 2022-03-16 (×2): 20 mg via INTRAVENOUS

## 2022-03-16 MED ORDER — CHLORHEXIDINE GLUCONATE 0.12 % MT SOLN
OROMUCOSAL | Status: AC
Start: 2022-03-16 — End: 2022-03-16
  Administered 2022-03-16: 15 mL via OROMUCOSAL
  Filled 2022-03-16: qty 15

## 2022-03-16 MED ORDER — OXYCODONE HCL 5 MG PO TABS: 5.0000 mg | ORAL_TABLET | Freq: Once | ORAL | Status: DC | PRN

## 2022-03-16 MED ORDER — POLYETHYLENE GLYCOL 3350 17 G PO PACK: 17.0000 g | PACK | Freq: Every day | ORAL | 0 refills | Status: AC

## 2022-03-16 MED ORDER — SUGAMMADEX SODIUM 200 MG/2ML IV SOLN
INTRAVENOUS | Status: DC | PRN
  Administered 2022-03-16: 400 mg via INTRAVENOUS

## 2022-03-16 MED ORDER — DEXAMETHASONE SODIUM PHOSPHATE 10 MG/ML IJ SOLN
INTRAMUSCULAR | Status: DC | PRN
  Administered 2022-03-16: 10 mg via INTRAVENOUS

## 2022-03-16 MED ORDER — ACETAMINOPHEN 650 MG RE SUPP: 650.0000 mg | RECTAL | Status: DC | PRN

## 2022-03-16 MED ORDER — BUPIVACAINE-EPINEPHRINE 0.5% -1:200000 IJ SOLN
INTRAMUSCULAR | Status: AC
  Filled 2022-03-16: qty 1

## 2022-03-16 MED ORDER — POTASSIUM CHLORIDE IN NACL 20-0.45 MEQ/L-% IV SOLN
INTRAVENOUS | Status: DC
  Filled 2022-03-16: qty 1000

## 2022-03-16 MED ORDER — FENTANYL CITRATE (PF) 250 MCG/5ML IJ SOLN
INTRAMUSCULAR | Status: AC
  Filled 2022-03-16: qty 5

## 2022-03-16 MED ORDER — THROMBIN 20000 UNITS EX SOLR
CUTANEOUS | Status: DC | PRN
  Administered 2022-03-16: 20 mL via TOPICAL

## 2022-03-16 MED ORDER — PROPOFOL 10 MG/ML IV BOLUS
INTRAVENOUS | Status: AC
  Filled 2022-03-16: qty 20

## 2022-03-16 MED ORDER — OXYCODONE HCL 10 MG PO TABS: 10.0000 mg | ORAL_TABLET | ORAL | 0 refills | Status: AC | PRN

## 2022-03-16 MED ORDER — MENTHOL 3 MG MT LOZG: 1.0000 | LOZENGE | OROMUCOSAL | Status: DC | PRN

## 2022-03-16 MED ORDER — LIDOCAINE 2% (20 MG/ML) 5 ML SYRINGE
INTRAMUSCULAR | Status: AC
  Filled 2022-03-16: qty 5

## 2022-03-16 MED ORDER — PROPOFOL 10 MG/ML IV BOLUS
INTRAVENOUS | Status: DC | PRN
  Administered 2022-03-16: 200 mg via INTRAVENOUS

## 2022-03-16 MED ORDER — POLYVINYL ALCOHOL 1.4 % OP SOLN
1.0000 [drp] | Freq: Every day | OPHTHALMIC | Status: DC | PRN
  Filled 2022-03-16: qty 15

## 2022-03-16 MED ORDER — ROCURONIUM BROMIDE 10 MG/ML (PF) SYRINGE
PREFILLED_SYRINGE | INTRAVENOUS | Status: AC
  Filled 2022-03-16: qty 20

## 2022-03-16 MED ORDER — ALUM & MAG HYDROXIDE-SIMETH 200-200-20 MG/5ML PO SUSP: 30.0000 mL | Freq: Four times a day (QID) | ORAL | Status: DC | PRN

## 2022-03-16 MED ORDER — LIDOCAINE 2% (20 MG/ML) 5 ML SYRINGE
INTRAMUSCULAR | Status: DC | PRN
Start: 2022-03-16 — End: 2022-03-16
  Administered 2022-03-16: 80 mg via INTRAVENOUS

## 2022-03-16 MED ORDER — METHOCARBAMOL 500 MG PO TABS: 500.0000 mg | ORAL_TABLET | Freq: Three times a day (TID) | ORAL | 1 refills | Status: DC | PRN

## 2022-03-16 MED ORDER — AMISULPRIDE (ANTIEMETIC) 5 MG/2ML IV SOLN: 10.0000 mg | Freq: Once | INTRAVENOUS | Status: DC | PRN

## 2022-03-16 MED ORDER — CEFAZOLIN IN SODIUM CHLORIDE 3-0.9 GM/100ML-% IV SOLN
3.0000 g | INTRAVENOUS | Status: AC
  Administered 2022-03-16: 3 g via INTRAVENOUS

## 2022-03-16 MED ORDER — ACETAMINOPHEN 325 MG PO TABS: 650.0000 mg | ORAL_TABLET | ORAL | Status: DC | PRN

## 2022-03-16 MED ORDER — MIDAZOLAM HCL 2 MG/2ML IJ SOLN
INTRAMUSCULAR | Status: AC
  Filled 2022-03-16: qty 2

## 2022-03-16 MED ORDER — ONDANSETRON HCL 4 MG/2ML IJ SOLN: 4.0000 mg | Freq: Four times a day (QID) | INTRAMUSCULAR | Status: DC | PRN

## 2022-03-16 MED ORDER — INSULIN ASPART 100 UNIT/ML IJ SOLN
0.0000 [IU] | Freq: Three times a day (TID) | INTRAMUSCULAR | Status: DC
  Administered 2022-03-16: 3 [IU] via SUBCUTANEOUS

## 2022-03-16 MED ORDER — CEFAZOLIN IN SODIUM CHLORIDE 3-0.9 GM/100ML-% IV SOLN
INTRAVENOUS | Status: AC
  Filled 2022-03-16: qty 100

## 2022-03-16 MED ORDER — BISACODYL 5 MG PO TBEC: 5.0000 mg | DELAYED_RELEASE_TABLET | Freq: Every day | ORAL | Status: DC | PRN

## 2022-03-16 MED ORDER — ORAL CARE MOUTH RINSE: 15.0000 mL | Freq: Once | OROMUCOSAL | Status: AC

## 2022-03-16 MED ORDER — ONDANSETRON HCL 4 MG/2ML IJ SOLN: 4.0000 mg | Freq: Once | INTRAMUSCULAR | Status: DC | PRN

## 2022-03-16 MED ORDER — FENTANYL CITRATE (PF) 100 MCG/2ML IJ SOLN
INTRAMUSCULAR | Status: AC
  Filled 2022-03-16: qty 2

## 2022-03-16 MED ORDER — DOCUSATE SODIUM 100 MG PO CAPS
100.0000 mg | ORAL_CAPSULE | Freq: Two times a day (BID) | ORAL | 1 refills | Status: AC | PRN
Start: 2022-03-16 — End: ?

## 2022-03-16 MED ORDER — INSULIN ASPART 100 UNIT/ML IJ SOLN: 0.0000 [IU] | INTRAMUSCULAR | Status: DC | PRN

## 2022-03-16 MED ORDER — TRANEXAMIC ACID 1000 MG/10ML IV SOLN
2000.0000 mg | Freq: Once | INTRAVENOUS | Status: DC
  Filled 2022-03-16: qty 20

## 2022-03-16 MED ORDER — METHOCARBAMOL 1000 MG/10ML IJ SOLN
500.0000 mg | Freq: Four times a day (QID) | INTRAVENOUS | Status: DC | PRN
  Filled 2022-03-16: qty 5

## 2022-03-16 MED ORDER — TRANEXAMIC ACID-NACL 1000-0.7 MG/100ML-% IV SOLN
1000.0000 mg | INTRAVENOUS | Status: AC
  Administered 2022-03-16: 1000 mg via INTRAVENOUS

## 2022-03-16 MED ORDER — POLYETHYLENE GLYCOL 3350 17 G PO PACK
17.0000 g | PACK | Freq: Every day | ORAL | Status: DC
  Administered 2022-03-16 – 2022-03-17 (×2): 17 g via ORAL
  Filled 2022-03-16 (×2): qty 1

## 2022-03-16 MED ORDER — 0.9 % SODIUM CHLORIDE (POUR BTL) OPTIME
TOPICAL | Status: DC | PRN
  Administered 2022-03-16: 1000 mL

## 2022-03-16 MED ORDER — OXYCODONE HCL 5 MG PO TABS: 5.0000 mg | ORAL_TABLET | ORAL | Status: DC | PRN

## 2022-03-16 MED ORDER — OXYCODONE HCL 5 MG PO TABS
10.0000 mg | ORAL_TABLET | ORAL | Status: DC | PRN
  Administered 2022-03-16 – 2022-03-17 (×3): 10 mg via ORAL
  Filled 2022-03-16 (×3): qty 2

## 2022-03-16 MED ORDER — TRANEXAMIC ACID-NACL 1000-0.7 MG/100ML-% IV SOLN
INTRAVENOUS | Status: AC
  Filled 2022-03-16: qty 100

## 2022-03-16 MED ORDER — HYDROMORPHONE HCL 1 MG/ML IJ SOLN: 1.0000 mg | INTRAMUSCULAR | Status: DC | PRN

## 2022-03-16 MED ORDER — BUPIVACAINE-EPINEPHRINE 0.5% -1:200000 IJ SOLN
INTRAMUSCULAR | Status: DC | PRN
  Administered 2022-03-16: 4 mL

## 2022-03-16 MED ORDER — CHLORHEXIDINE GLUCONATE 0.12 % MT SOLN: 15.0000 mL | Freq: Once | OROMUCOSAL | Status: AC

## 2022-03-16 MED ORDER — FENTANYL CITRATE (PF) 100 MCG/2ML IJ SOLN
25.0000 ug | INTRAMUSCULAR | Status: DC | PRN
  Administered 2022-03-16: 50 ug via INTRAVENOUS

## 2022-03-16 MED ORDER — DOCUSATE SODIUM 100 MG PO CAPS
100.0000 mg | ORAL_CAPSULE | Freq: Two times a day (BID) | ORAL | Status: DC
  Administered 2022-03-16 – 2022-03-17 (×2): 100 mg via ORAL
  Filled 2022-03-16 (×2): qty 1

## 2022-03-16 MED ORDER — LOSARTAN POTASSIUM 50 MG PO TABS
100.0000 mg | ORAL_TABLET | Freq: Every day | ORAL | Status: DC
  Administered 2022-03-16 – 2022-03-17 (×2): 100 mg via ORAL
  Filled 2022-03-16 (×2): qty 2

## 2022-03-16 MED ORDER — HYDROCHLOROTHIAZIDE 12.5 MG PO TABS
12.5000 mg | ORAL_TABLET | Freq: Every day | ORAL | Status: DC
Start: 2022-03-16 — End: 2022-03-17
  Administered 2022-03-16 – 2022-03-17 (×2): 12.5 mg via ORAL
  Filled 2022-03-16: qty 1

## 2022-03-16 MED ORDER — OXYCODONE HCL 5 MG/5ML PO SOLN: 5.0000 mg | Freq: Once | ORAL | Status: DC | PRN

## 2022-03-16 MED ORDER — METHOCARBAMOL 500 MG PO TABS
500.0000 mg | ORAL_TABLET | Freq: Four times a day (QID) | ORAL | Status: DC | PRN
  Administered 2022-03-16 – 2022-03-17 (×3): 500 mg via ORAL
  Filled 2022-03-16 (×3): qty 1

## 2022-03-16 MED ORDER — LOSARTAN POTASSIUM-HCTZ 100-12.5 MG PO TABS: 1.0000 | ORAL_TABLET | Freq: Every day | ORAL | Status: DC

## 2022-03-16 MED ORDER — MIDAZOLAM HCL 2 MG/2ML IJ SOLN
INTRAMUSCULAR | Status: DC | PRN
  Administered 2022-03-16: 2 mg via INTRAVENOUS

## 2022-03-16 MED ORDER — ACETAMINOPHEN 10 MG/ML IV SOLN
1000.0000 mg | INTRAVENOUS | Status: AC
  Administered 2022-03-16: 1000 mg via INTRAVENOUS

## 2022-03-16 MED ORDER — PHENYLEPHRINE HCL-NACL 20-0.9 MG/250ML-% IV SOLN
INTRAVENOUS | Status: DC | PRN
Start: 2022-03-16 — End: 2022-03-16
  Administered 2022-03-16: 40 ug/min via INTRAVENOUS

## 2022-03-16 MED ORDER — ACETAMINOPHEN 10 MG/ML IV SOLN
INTRAVENOUS | Status: AC
  Filled 2022-03-16: qty 100

## 2022-03-16 MED ORDER — PANTOPRAZOLE SODIUM 40 MG PO TBEC
40.0000 mg | DELAYED_RELEASE_TABLET | Freq: Every day | ORAL | Status: DC
  Administered 2022-03-17: 40 mg via ORAL
  Filled 2022-03-16: qty 1

## 2022-03-16 MED ORDER — PHENOL 1.4 % MT LIQD: 1.0000 | OROMUCOSAL | Status: DC | PRN

## 2022-03-16 MED ORDER — CEFAZOLIN IN SODIUM CHLORIDE 3-0.9 GM/100ML-% IV SOLN
3.0000 g | Freq: Four times a day (QID) | INTRAVENOUS | Status: AC
  Administered 2022-03-16 (×2): 3 g via INTRAVENOUS
  Filled 2022-03-16 (×5): qty 100

## 2022-03-16 MED ORDER — THROMBIN 20000 UNITS EX SOLR
CUTANEOUS | Status: AC
  Filled 2022-03-16: qty 20000

## 2022-03-16 MED ORDER — FENTANYL CITRATE (PF) 250 MCG/5ML IJ SOLN
INTRAMUSCULAR | Status: DC | PRN
  Administered 2022-03-16: 100 ug via INTRAVENOUS
  Administered 2022-03-16 (×3): 50 ug via INTRAVENOUS

## 2022-03-16 MED ORDER — DEXAMETHASONE SODIUM PHOSPHATE 10 MG/ML IJ SOLN
INTRAMUSCULAR | Status: AC
  Filled 2022-03-16: qty 1

## 2022-03-16 MED ORDER — ONDANSETRON HCL 4 MG/2ML IJ SOLN
INTRAMUSCULAR | Status: DC | PRN
Start: 2022-03-16 — End: 2022-03-16
  Administered 2022-03-16: 4 mg via INTRAVENOUS

## 2022-03-16 MED ORDER — ONDANSETRON HCL 4 MG/2ML IJ SOLN
INTRAMUSCULAR | Status: AC
  Filled 2022-03-16: qty 2

## 2022-03-16 SURGICAL SUPPLY — 61 items
BAG COUNTER SPONGE SURGICOUNT (BAG) ×2 IMPLANT
BAG DECANTER FOR FLEXI CONT (MISCELLANEOUS) IMPLANT
BAND RUBBER #18 3X1/16 STRL (MISCELLANEOUS) ×4 IMPLANT
BUR RND DIAMOND ELITE 4.0 (BURR) ×1 IMPLANT
BUR STRYKR EGG 5.0 (BURR) ×1 IMPLANT
CARTRIDGE OIL MAESTRO DRILL (MISCELLANEOUS) IMPLANT
CLEANER TIP ELECTROSURG 2X2 (MISCELLANEOUS) ×2 IMPLANT
CNTNR URN SCR LID CUP LEK RST (MISCELLANEOUS) ×1 IMPLANT
CONT SPEC 4OZ STRL OR WHT (MISCELLANEOUS) ×1
DIFFUSER DRILL AIR PNEUMATIC (MISCELLANEOUS) ×1 IMPLANT
DRAPE LAPAROTOMY 100X72X124 (DRAPES) ×2 IMPLANT
DRAPE MICROSCOPE LEICA (MISCELLANEOUS) ×2 IMPLANT
DRAPE SHEET LG 3/4 BI-LAMINATE (DRAPES) ×2 IMPLANT
DRAPE SURG 17X11 SM STRL (DRAPES) ×2 IMPLANT
DRAPE UTILITY XL STRL (DRAPES) ×2 IMPLANT
DRSG AQUACEL AG ADV 3.5X 4 (GAUZE/BANDAGES/DRESSINGS) IMPLANT
DRSG AQUACEL AG ADV 3.5X 6 (GAUZE/BANDAGES/DRESSINGS) ×1 IMPLANT
DRSG TELFA 3X8 NADH (GAUZE/BANDAGES/DRESSINGS) IMPLANT
DURAPREP 26ML APPLICATOR (WOUND CARE) ×2 IMPLANT
DURASEAL SPINE SEALANT 3ML (MISCELLANEOUS) IMPLANT
ELECT BLADE 4.0 EZ CLEAN MEGAD (MISCELLANEOUS)
ELECT REM PT RETURN 9FT ADLT (ELECTROSURGICAL) ×2
ELECTRODE BLDE 4.0 EZ CLN MEGD (MISCELLANEOUS) IMPLANT
ELECTRODE REM PT RTRN 9FT ADLT (ELECTROSURGICAL) ×1 IMPLANT
GLOVE BIOGEL PI IND STRL 7.0 (GLOVE) ×1 IMPLANT
GLOVE BIOGEL PI INDICATOR 7.0 (GLOVE) ×4
GLOVE SURG SS PI 7.5 STRL IVOR (GLOVE) ×2 IMPLANT
GLOVE SURG SS PI 8.0 STRL IVOR (GLOVE) ×4 IMPLANT
GOWN STRL REUS W/ TWL LRG LVL3 (GOWN DISPOSABLE) ×1 IMPLANT
GOWN STRL REUS W/ TWL XL LVL3 (GOWN DISPOSABLE) ×1 IMPLANT
GOWN STRL REUS W/TWL LRG LVL3 (GOWN DISPOSABLE) ×1
GOWN STRL REUS W/TWL XL LVL3 (GOWN DISPOSABLE) ×1
IV CATH 14GX2 1/4 (CATHETERS) ×2 IMPLANT
KIT BASIN OR (CUSTOM PROCEDURE TRAY) ×2 IMPLANT
KIT POSITION SURG JACKSON T1 (MISCELLANEOUS) IMPLANT
NDL SPNL 18GX3.5 QUINCKE PK (NEEDLE) ×2 IMPLANT
NEEDLE 22X1 1/2 (OR ONLY) (NEEDLE) ×2 IMPLANT
NEEDLE SPNL 18GX3.5 QUINCKE PK (NEEDLE) ×4 IMPLANT
OIL CARTRIDGE MAESTRO DRILL (MISCELLANEOUS) ×2
PACK LAMINECTOMY NEURO (CUSTOM PROCEDURE TRAY) ×2 IMPLANT
PAD DRESSING TELFA 3X8 NADH (GAUZE/BANDAGES/DRESSINGS) IMPLANT
PATTIES SURGICAL .75X.75 (GAUZE/BANDAGES/DRESSINGS) ×2 IMPLANT
SPONGE SURGIFOAM ABS GEL 100 (HEMOSTASIS) ×2 IMPLANT
SPONGE T-LAP 4X18 ~~LOC~~+RFID (SPONGE) ×1 IMPLANT
STAPLER VISISTAT (STAPLE) ×1 IMPLANT
STRIP CLOSURE SKIN 1/2X4 (GAUZE/BANDAGES/DRESSINGS) ×1 IMPLANT
SUT NURALON 4 0 TR CR/8 (SUTURE) IMPLANT
SUT PROLENE 3 0 PS 2 (SUTURE) IMPLANT
SUT VIC AB 1 CT1 27 (SUTURE) ×1
SUT VIC AB 1 CT1 27XBRD ANTBC (SUTURE) IMPLANT
SUT VIC AB 1 CT1 36 (SUTURE) ×2 IMPLANT
SUT VIC AB 1-0 CT2 27 (SUTURE) ×2 IMPLANT
SUT VIC AB 2-0 CT1 27 (SUTURE) ×2
SUT VIC AB 2-0 CT1 TAPERPNT 27 (SUTURE) IMPLANT
SUT VIC AB 2-0 CT2 27 (SUTURE) ×1 IMPLANT
SYR 3ML LL SCALE MARK (SYRINGE) ×2 IMPLANT
TOWEL GREEN STERILE (TOWEL DISPOSABLE) ×2 IMPLANT
TOWEL GREEN STERILE FF (TOWEL DISPOSABLE) ×2 IMPLANT
TRAY FOLEY MTR SLVR 16FR STAT (SET/KITS/TRAYS/PACK) ×2 IMPLANT
WIPE CHG CHLORHEXIDINE 2% (PERSONAL CARE ITEMS) ×2 IMPLANT
YANKAUER SUCT BULB TIP NO VENT (SUCTIONS) ×2 IMPLANT

## 2022-03-16 NOTE — Brief Op Note (Signed)
03/16/2022 ? ?9:38 AM ? ?PATIENT:  Christian Hubbard  61 y.o. male ? 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 ?POST-OPERATIVE DIAGNOSIS:  * No post-op diagnosis entered * ? ?PROCEDURE:  Procedure(s): ?Microlumbar decompression Microdiscectomy L4-5, L5-S1 (N/A) ? ?SURGEON:  Surgeon(s) and Role: ?   Susa Day, MD - Primary ? ?PHYSICIAN ASSISTANT:  ? ?ASSISTANTS: [place]  ? ?ANESTHESIA:   general ? ?EBL:  150cc  ? ?BLOOD ADMINISTERED:none ? ?DRAINS: none  ? ?LOCAL MEDICATIONS USED:  MARCAINE    ? ?SPECIMEN:  No Specimen ? ?DISPOSITION OF SPECIMEN:  N/A ? ?COUNTS:  YES ? ?TOURNIQUET:  * No tourniquets in log * ? ?DICTATION: .Other Dictation: Dictation Number [id number] ? ?PLAN OF CARE: Admit for overnight observation ? ?PATIENT DISPOSITION:  PACU - hemodynamically stable. ?  ?Delay start of Pharmacological VTE agent (>24hrs) due to surgical blood loss or risk of bleeding: yes ? ?

## 2022-03-16 NOTE — Anesthesia Procedure Notes (Signed)
Procedure Name: Intubation ?Date/Time: 03/16/2022 10:19 AM ?Performed by: Dorann Lodge, CRNA ?Pre-anesthesia Checklist: Patient identified, Emergency Drugs available, Suction available and Patient being monitored ?Patient Re-evaluated:Patient Re-evaluated prior to induction ?Oxygen Delivery Method: Circle System Utilized ?Preoxygenation: Pre-oxygenation with 100% oxygen ?Induction Type: IV induction ?Ventilation: Oral airway inserted - appropriate to patient size and Two handed mask ventilation required ?Laryngoscope Size: Glidescope and 4 ?Grade View: Grade I ?Tube type: Oral ?Tube size: 7.5 mm ?Number of attempts: 1 ?Airway Equipment and Method: Oral airway, Video-laryngoscopy and Rigid stylet ?Placement Confirmation: ETT inserted through vocal cords under direct vision, positive ETCO2 and breath sounds checked- equal and bilateral ?Secured at: 25 cm ?Tube secured with: Tape ?Dental Injury: Teeth and Oropharynx as per pre-operative assessment  ? ? ? ? ?

## 2022-03-16 NOTE — Op Note (Signed)
NAME: Christian Hubbard, Christian A. ?MEDICAL RECORD NO: [id number] ?ACCOUNT NO: [id number] ?DATE OF BIRTH: [birth year] ?FACILITY: [facility] ?LOCATION: [facility] ?PHYSICIAN: Johnn Hai, MD ? ?Operative Report  ? ?DATE OF PROCEDURE: 03/16/2022 ? ?PREOPERATIVE DIAGNOSIS:  Spinal stenosis, HNP, L4-L5 right. ? ?POSTOPERATIVE DIAGNOSES:  Spinal stenosis, HNP, L4-L5 right, extensive epidural venous plexus. ? ?PROCEDURE PERFORMED:   ?1.  Microlumbar decompression, L4-L5, right. ?2.  Foraminotomies L4-L5, right. ?3.  Microdiskectomy L4-L5, right. ?4.  Lysis of epidural venous plexus L4-L5, right. ? ?Technical difficulty increased due to the patient's elevated BMI 39.29. ? ?HISTORY:  A 61 year old right lower extremity radicular pain, L5-S1 nerve root distribution, secondary large disk herniation at L4-L5 migrating caudad.  He was indicated for decompression given the neural tension signs in dorsiflexion and plantar  ?flexion, weakness and diminished sensation L5-S1 dermatome.  Risks and benefits discussed including bleeding, infection, damage to neurovascular structures, no change in symptoms, worsening symptoms, DVT, PE, anesthetic complications, etc. ? ?DESCRIPTION OF PROCEDURE:  With the patient in supine position after induction of adequate general anesthesia, 3 grams Kefzol placed prone on the Wilson frame.  All bony prominences were well padded.  Lumbar region was prepped and draped in the usual  ?sterile fashion.  Two 18-gauge spinal needle was utilized to localize L4-L5 interspace, confirmed with x-ray.  Incision was made from the spinous process of 4-5.  Subcutaneous tissue was dissected.  Electrocautery was utilized to achieve hemostasis.   ?Dorsal lumbar fascia divided in line with skin incision.  Paraspinous muscle elevated from lamina of 4-5.  Color retractor was placed.  Operating microscope was draped and brought in the surgical field.  Confirmatory radiograph obtained.  Very small  ?interlaminar window at L4-L5 was noted.  I  used a high-speed bur to perform a hemilaminotomy of the caudad edge of 4.  Straight curette utilized to detach ligamentum from the cephalad edge of 5.  I continued cephalad with 2 and 3 mm Kerrison to the point ? detaching the ligamentum flavum.  I also detached ligamentum flavum from the cephalad edge of L5.  Next, I removed a portion of the inferior process of L4 less than 30% of the facet.  I identified the superior articulating process of L5.  I then used a  ?straight curette to detach ligamentum flavum from the cephalad edge of L5.  I then used a Penfield to develop a plane between the superior articulating process and the ligamentum flavum.  Following this, I gently mobilized and protected the neural  ?elements and performed a foraminotomy of S1 to decompress the lateral recess and medial border of the pedicle.  There was extensive epidural venous plexus encasing the lateral recess on the right.  I used bipolar electrocautery utilized to achieve  ?hemostasis.  I elevated the venous plexus with a nerve hook.  Once this was done, I then cauterized and divided the venous plexus.  The 5 root was noted to be compressed into the lateral recess.  I performed a foraminotomy of 4.  I removed ligamentum  ?flavum from the interspace.  There was hypertrophic ligamentum flavum and facet arthropathy noted.  Following this, I gently mobilized the 5 root medially and found a large central HNP.  I performed an annulotomy and copious portion of disk material was  ?removed from the disk space with a straight and upbiting micropituitary also further mobilized from the subannular space over the vertebral body of 5.  Out to the medial border of the foramen of 4.  Cephalad  as well as protected the neural elements at  ?all times carefully throughout the case.  Neuro patties and general intermittent neural retraction.  Venous plexus was noted beneath the 5 root as well.  We placed thrombin-soaked Gelfoam there and explored distally.   As it appeared on his MRI that he  ?had a fragment migrating caudad almost to the point of the disk space at L5-S1.  However, when I looked beneath the 5 root in between the 5 root and the thecal sac distally sweeping with a Woodson and probing with a Penfield there were no fragments noted ? here distally.  Thought perhaps this was either hematoma from original MRI or some resolution given that it has been two months since his MRI.  I explored this thoroughly.  I found no fragments distal to the mid vertebral body of 5.  I used a nerve hook ? to probe the disk space and the subannular space, retrieved multiple fragments. I irrigated the disk space with lavaged catheter.  Additional fragments were retrieved.  I further mobilized with an Tommie Ard and a Woodson.  Additional fragments were  ?retrieved.  Bipolar cautery was utilized to achieve hemostasis.  Woodson probe passed freely out the foramen of L4 and L5 following the decompression.  There were osteophytic ridges at the disk space 4-5 at the vertebral body of L4 and of L5.  There was  ?1 cm of excursion of the 5 root medially to pedicle without tension following the decompression.  Typical disk material was removed.  Following this, I obtained a confirmatory radiograph with marker in the disk space and distal through the foramen.   ?Copiously irrigated with irrigation.  I placed TXA-soaked patties in the wound, let them sit for a few minutes and then removed them.  I saw no active bleeding or CSF leakage.  Then all pathology had been addressed.  A small piece of thrombin-soaked  ?Gelfoam was placed beneath the 5 root where the region of the epidural venous plexus was noted.  I then removed the [facility] retractor, irrigated the paraspinous musculature.  Bipolar cautery was utilized to achieve hemostasis.  I then closed the dorsal ? lumbar fascia with #1 Vicryl in interrupted figure-of-eight sutures, subcutaneous with 2-0 and skin with staples.  Small apertures  were left in the fascia as well as in the skin to allow for any drainage that may occur.  Sterile dressing applied.  She  ?was placed supine on the hospital bed, extubated without difficulty and transported to the recovery room in satisfactory condition. ? ?The patient tolerated the procedure well.  No complications. ? ?ASSISTANT:  Lacie Draft, PA. ? ?BLOOD LOSS:  150 mL. ? ? ?PUS ?D: 03/16/2022 1:28:13 pm T: 03/16/2022 3:35:00 pm  ?JOB: [id number]/ [id number]  ?

## 2022-03-16 NOTE — Discharge Instructions (Signed)

## 2022-03-16 NOTE — Anesthesia Postprocedure Evaluation (Signed)
Anesthesia Post Note ? ?Patient: STANLEE ROEHRIG ? ?Procedure(s) Performed: Microlumbar decompression Microdiscectomy Lumbar four-five, Lumbar five-Sacral one (Back) ? ?  ? ?Patient location during evaluation: PACU ?Anesthesia Type: General ?Level of consciousness: awake ?Pain management: pain level controlled ?Vital Signs Assessment: post-procedure vital signs reviewed and stable ?Respiratory status: spontaneous breathing and respiratory function stable ?Cardiovascular status: stable ?Postop Assessment: no apparent nausea or vomiting ?Anesthetic complications: no ? ? ?No notable events documented. ? ?Last Vitals:  ?Vitals:  ? 03/16/22 1430 03/16/22 1501  ?BP: (!) 150/98 (!) 150/95  ?Pulse: 77 80  ?Resp: 13 20  ?Temp:  (!) 36.4 ?C  ?SpO2: 100% 97%  ?  ?Last Pain:  ?Vitals:  ? 03/16/22 1501  ?TempSrc: Oral  ?PainSc:   ? ? ?  ?  ?  ?  ?  ?  ? ?Merlinda Frederick ? ? ? ? ?

## 2022-03-16 NOTE — Transfer of Care (Signed)
Immediate Anesthesia Transfer of Care Note ? ?Patient: Christian Hubbard ? ?Procedure(s) Performed: Microlumbar decompression Microdiscectomy Lumbar four-five, Lumbar five-Sacral one (Back) ? ?Patient Location: PACU ? ?Anesthesia Type:General ? ?Level of Consciousness: awake and drowsy ? ?Airway & Oxygen Therapy: Patient Spontanous Breathing and Patient connected to face mask oxygen ? ?Post-op Assessment: Report given to RN and Post -op Vital signs reviewed and stable ? ?Post vital signs: Reviewed and stable ? ?Last Vitals:  ?Vitals Value Taken Time  ?BP    ?Temp    ?Pulse    ?Resp    ?SpO2    ? ? ?Last Pain:  ?Vitals:  ? 03/16/22 0902  ?TempSrc:   ?PainSc: 4   ?   ? ?  ? ?Complications: No notable events documented. ?

## 2022-03-17 ENCOUNTER — Encounter (HOSPITAL_COMMUNITY): Payer: Self-pay | Admitting: Specialist

## 2022-03-17 DIAGNOSIS — M48061 Spinal stenosis, lumbar region without neurogenic claudication: Secondary | ICD-10-CM | POA: Diagnosis not present

## 2022-03-17 LAB — BASIC METABOLIC PANEL
Anion gap: 9 (ref 5–15)
BUN: 12 mg/dL (ref 6–20)
CO2: 27 mmol/L (ref 22–32)
Calcium: 9.5 mg/dL (ref 8.9–10.3)
Chloride: 101 mmol/L (ref 98–111)
Creatinine, Ser: 1 mg/dL (ref 0.61–1.24)
GFR, Estimated: >60 mL/min (ref >60)
Glucose, Bld: 116 mg/dL — ABNORMAL HIGH (ref 70–99)
Potassium: 4.2 mmol/L (ref 3.5–5.1)
Sodium: 137 mmol/L (ref 135–145)

## 2022-03-17 LAB — GLUCOSE, CAPILLARY: Glucose-Capillary: 106 mg/dL — ABNORMAL HIGH (ref 70–99)

## 2022-03-17 NOTE — Plan of Care (Signed)
?  Problem: Education: Goal: Ability to verbalize activity precautions or restrictions will improve Outcome: Completed/Met Goal: Knowledge of the prescribed therapeutic regimen will improve Outcome: Completed/Met Goal: Understanding of discharge needs will improve Outcome: Completed/Met   Problem: Activity: Goal: Ability to avoid complications of mobility impairment will improve Outcome: Completed/Met Goal: Ability to tolerate increased activity will improve Outcome: Completed/Met Goal: Will remain free from falls Outcome: Completed/Met   Problem: Bowel/Gastric: Goal: Gastrointestinal status for postoperative course will improve Outcome: Completed/Met   Problem: Clinical Measurements: Goal: Ability to maintain clinical measurements within normal limits will improve Outcome: Completed/Met Goal: Postoperative complications will be avoided or minimized Outcome: Completed/Met Goal: Diagnostic test results will improve Outcome: Completed/Met   Problem: Pain Management: Goal: Pain level will decrease Outcome: Completed/Met   Problem: Skin Integrity: Goal: Will show signs of wound healing Outcome: Completed/Met   Problem: Health Behavior/Discharge Planning: Goal: Identification of resources available to assist in meeting health care needs will improve Outcome: Completed/Met   Problem: Bladder/Genitourinary: Goal: Urinary functional status for postoperative course will improve Outcome: Completed/Met   

## 2022-03-17 NOTE — Progress Notes (Signed)
PT Cancellation Note ? ?Patient Details ?Name: Christian Hubbard ?MRN: [id number] ?DOB: 1961-09-16 ? ? ?Cancelled Treatment:    Reason Eval/Treat Not Completed: PT screened, no needs identified, will sign off. Pt seen by OT and mobilizing well.  ? ? ?[name] ?03/17/2022, 9:29 AM ?[name] PT ?Acute Rehabilitation Services ?Office [phone] ? ?

## 2022-03-17 NOTE — Evaluation (Addendum)
Occupational Therapy Evaluation ?Patient Details ?Name: Christian Hubbard ?MRN: [id number] ?DOB: 1961-01-09 ?Today's Date: 03/17/2022 ? ? ?History of Present Illness 61 yo male s/p microlumbar decompression microdiscectomy L4-5 L5-s1 PMH depression, DM ED HLD HTN mediastinal mass, OSA on CPAP umbicial hernia repair, remote smoker,  ? ?Clinical Impression ?  ?Patient evaluated by Occupational Therapy with no further acute OT needs identified. All education has been completed and the patient has no further questions. See below for any follow-up Occupational Therapy or equipment needs. OT to sign off. Thank you for referral.  ?  ?OT communicated session to PT and screen is appropriate for PT eval.  ?   ? ?Recommendations for follow up therapy are one component of a multi-disciplinary discharge planning process, led by the attending physician.  Recommendations may be updated based on patient status, additional functional criteria and insurance authorization.  ? ?Follow Up Recommendations ? No OT follow up  ?  ?Assistance Recommended at Discharge PRN  ?Patient can return home with the following   ? ?  ?Functional Status Assessment ? Patient has had a recent decline in their functional status and demonstrates the ability to make significant improvements in function in a reasonable and predictable amount of time.  ?Equipment Recommendations ? None recommended by OT  ?  ?Recommendations for Other Services   ? ? ?  ?Precautions / Restrictions Precautions ?Precautions: Back ?Precaution Comments: handout provided and reviewed ?Restrictions ?Weight Bearing Restrictions: No  ? ?  ? ?Mobility Bed Mobility ?  ?  ?  ?  ?  ?  ?  ?General bed mobility comments: oob on arrival ?  ? ?Transfers ?Overall transfer level: Modified independent ?  ?  ?  ?  ?  ?  ?  ?  ?General transfer comment: pushing up from chair arm rest ?  ? ?  ?Balance Overall balance assessment: Mild deficits observed, not formally tested ?  ?  ?  ?  ?  ?  ?  ?  ?  ?   ?  ?  ?  ?  ?  ?  ?  ?  ?   ? ?ADL either performed or assessed with clinical judgement  ? ?ADL Overall ADL's : Needs assistance/impaired ?Eating/Feeding: Independent ?  ?Grooming: Wash/dry hands;Wash/dry face;Independent ?  ?Upper Body Bathing: Modified independent;Sitting ?  ?Lower Body Bathing: Modified independent;With adaptive equipment ?  ?  ?  ?Lower Body Dressing: Modified independent;With adaptive equipment;Sit to/from stand ?Lower Body Dressing Details (indicate cue type and reason): educated on sock aide and reacher. pt can not figure 4 cross and don shorts today with bending prior to OT arrival per pt ?Toilet Transfer: Modified Independent;Ambulation;Comfort height toilet ?  ?  ?Toileting - Clothing Manipulation Details (indicate cue type and reason): educated on toilet tongs ?  ?  ?Functional mobility during ADLs: Modified independent;Cane ?General ADL Comments: pt completed steps to simulate entry into the house. pt using a side step method holding rail  ?Back handout provided and reviewed adls in detail. Pt educated on: , set an alarm at night for medication, avoid sitting for long periods of time, correct bed positioning for sleeping, correct sequence for bed mobility, avoiding lifting more than 5 pounds and never wash directly over incision. All education is complete and patient indicates understanding. ? ? ? ?Vision Baseline Vision/History: 1 Wears glasses ?Ability to See in Adequate Light: 0 Adequate ?Patient Visual Report: No change from baseline ?   ?   ?Perception   ?  ?  Praxis   ?  ? ?Pertinent Vitals/Pain Pain Assessment ?Pain Assessment: Faces ?Faces Pain Scale: Hurts a little bit ?Pain Location: back ?Pain Descriptors / Indicators: Grimacing ?Pain Intervention(s): Monitored during session, Premedicated before session, Repositioned  ? ? ? ?Hand Dominance Right ?  ?Extremity/Trunk Assessment Upper Extremity Assessment ?Upper Extremity Assessment: Overall WFL for tasks assessed ?  ?Lower  Extremity Assessment ?Lower Extremity Assessment: Overall WFL for tasks assessed ?  ?Cervical / Trunk Assessment ?Cervical / Trunk Assessment: Back Surgery ?  ?Communication Communication ?Communication: No difficulties ?  ?Cognition Arousal/Alertness: Awake/alert ?Behavior During Therapy: [facility] for tasks assessed/performed ?Overall Cognitive Status: Within Functional Limits for tasks assessed ?  ?  ?  ?  ?  ?  ?  ?  ?  ?  ?  ?  ?  ?  ?  ?  ?  ?  ?  ?General Comments    ? ?  ?Exercises   ?  ?Shoulder Instructions    ? ? ?Home Living Family/patient expects to be discharged to:: Private residence ?Living Arrangements: Alone ?Available Help at Discharge: Family;Available PRN/intermittently (71 yo mother) ?Type of Home: House ?Home Access: Stairs to enter ?Entrance Stairs-Number of Steps: 7 ?Entrance Stairs-Rails: Can reach both ?Home Layout: Two level;1/2 bath on main level;Bed/bath upstairs ?Alternate Level Stairs-Number of Steps: flight ?Alternate Level Stairs-Rails: Right ?Bathroom Shower/Tub: Tub/shower unit ?  ?Bathroom Toilet: Handicapped height ?  ?  ?Home Equipment: Cane - single point;Crutches ?  ?Additional Comments: has mother to help him. son is local and can (A) with AE needs purchase from [place] ?  ? ?  ?Prior Functioning/Environment Prior Level of Function : Driving ?  ?  ?  ?  ?  ?  ?  ?  ?  ? ?  ?  ?OT Problem List:   ?  ?   ?OT Treatment/Interventions:    ?  ?OT Goals(Current goals can be found in the care plan section) Acute Rehab OT Goals ?Patient Stated Goal: to go home today ?OT Goal Formulation: With patient  ?OT Frequency:   ?  ? ?Co-evaluation   ?  ?  ?  ?  ? ?  ?AM-PAC OT "6 Clicks" Daily Activity     ?Outcome Measure Help from another person eating meals?: None ?Help from another person taking care of personal grooming?: None ?Help from another person toileting, which includes using toliet, bedpan, or urinal?: None ?Help from another person bathing (including washing, rinsing, drying)?:  None ?Help from another person to put on and taking off regular upper body clothing?: None ?Help from another person to put on and taking off regular lower body clothing?: None ?6 Click Score: 24 ?  ?End of Session Nurse Communication: Mobility status;Precautions ? ?Activity Tolerance: Patient tolerated treatment well ?Patient left: in chair;with call bell/phone within reach ? ?OT Visit Diagnosis: Unsteadiness on feet (R26.81)  ?              ?Time: 9179-1505 ?OT Time Calculation (min): 18 min ?Charges:  OT General Charges ?$OT Visit: 1 Visit ?OT Evaluation ?$OT Eval Moderate Complexity: 1 Mod ? ? ?Brynn, OTR/L  ?Acute Rehabilitation Services ?Pager: [phone] ?Office: [phone] ?. ? ? ?Jeri Modena ?03/17/2022, 9:21 AM ?

## 2022-03-17 NOTE — Progress Notes (Signed)
Patient awaiting transport via wheelchair by NT for discharge to home; in no acute distress nor complaints of pain nor discomfort; moves all extremities well; incision on his back with hydrocolloid dressing and is clean, dry and intact; room was checked for all his belongings and took along with him; discharge instructions concerning his medications, incision care, follow up appointment and when to call the doctor as needed were all discussed with patient by RN and he expressed understanding on the instructions given.  ?  ?  ?  ? ?

## 2022-03-17 NOTE — Progress Notes (Signed)
Subjective: ?1 Day Post-Op Procedure(s) (LRB): ?Microlumbar decompression Microdiscectomy Lumbar four-five, Lumbar five-Sacral one (N/A) ?Patient reports pain as mild.   ?No N/V. Notes back pain, numbness in leg unchanged but not c/o leg pain. Voiding without difficulty. ? ?Objective: ?Vital signs in last 24 hours: ?Temp:  [97.2 ?F (36.2 ?C)-98.6 ?F (37 ?C)] 97.8 ?F (36.6 ?C) (04/21 0534) ?Pulse Rate:  [72-94] 72 (04/21 0534) ?Resp:  [11-28] 20 (04/21 0534) ?BP: (119-157)/(75-98) 143/91 (04/21 0534) ?SpO2:  [92 %-100 %] 99 % (04/21 0534) ?Weight:  [138.8 kg] 138.8 kg (04/20 0845) ? ?Intake/Output from previous day: ?04/20 0701 - 04/21 0700 ?In: 2180 [P.O.:480; I.V.:1200; IV Piggyback:500] ?Out: 455 [Urine:305; Blood:150] ?Intake/Output this shift: ?No intake/output data recorded. ? ?No results for input(s): HGB in the last 72 hours. ?No results for input(s): WBC, RBC, HCT, PLT in the last 72 hours. ?Recent Labs  ?  03/17/22 ?0522  ?NA 137  ?K 4.2  ?CL 101  ?CO2 27  ?BUN 12  ?CREATININE 1.00  ?GLUCOSE 116*  ?CALCIUM 9.5  ? ?No results for input(s): LABPT, INR in the last 72 hours. ? ?Neurologically intact ?ABD soft ?Neurovascular intact ?Sensation intact distally ?Intact pulses distally ?Dorsiflexion/Plantar flexion intact ?Incision: dressing C/D/I and no drainage ?No cellulitis present ?Compartment soft ?No calf pain or sign of DVT ? ? ?Assessment/Plan: ?1 Day Post-Op Procedure(s) (LRB): ?Microlumbar decompression Microdiscectomy Lumbar four-five, Lumbar five-Sacral one (N/A) ?Advance diet ?Up with therapy ?D/C IV fluids ?D/C home  ?Discussed D/C instructions, LSpine precautions, dressing instructions ? ?Cecilie Kicks ?03/17/2022, 8:01 AM ? ?

## 2022-03-21 NOTE — Procedures (Signed)
? ? ? ? ?[facility]  ?Polysomnogram Report ?Part I ? ?Phone: [phone] ?Fax: [phone] ? ?Patient Name: Christian Hubbard, Christian Hubbard Acquisition Number: [id number]  ?Date of Birth: 12-17-1960 Acquisition Date: 03/15/2022  ?Referring Physician: Dimitri Ped, MD    ? ?History: The patient is a 61 year old male with obstructive sleep apnea for CPAP titration. Medical History: back issues, depression, diabetes and OSA. ? ?Medications: losartan, pravastatin, amlodipine, omeprazole and metformin. ? ?Procedure: This routine overnight polysomnogram was performed on the [facility] 5 using the standard CPAP protocol. This included 6 channels of EEG, 2 channels of EOG, chin EMG, bilateral anterior tibialis EMG, nasal/oral thermistor, PTAF (nasal pressure transducer), chest and abdominal wall movements, EKG, and pulse oximetry. ? ?Description: The total recording time was 369.2 minutes. The total sleep time was 314.0 minutes. There were a total of 52.9 minutes of wakefulness after sleep onset for a reducedsleep efficiency of 85.0%. The latency to sleep onset was shortat 2.3 minutes. The R sleep onset latency was short at 44.5 minutes. Sleep parameters, as a percentage of the total sleep time, demonstrated 15.9% of sleep was in N1 sleep, 49.4% N2, 2.9% N3 and 31.8% R sleep. There were a total of 155 arousals for an arousal index of 29.6 arousals per hour of sleep that was elevated. ? ?Overall, there were a total of 24 respiratory events for a respiratory disturbance index, which includes apneas, hypopneas and RERAs (increased respiratory effort) of 4.6 respiratory events per hour of sleep during the pressure titration. CPAP was initiated at 4 cm H2O at lights out, 12:12 a.m. It was titrated in 1-2 cm increments for intermittent hypopneas to 7 cm H2O. The apnea was well controlled at this pressure and supine, REM sleep was observed. The pressure was further titrated to the final pressure of 9 cm H2O for snoring and flow  limitation.  ? ?Additionally, the baseline oxygen saturation during wakefulness was 97%, during NREM sleep averaged 96%, and during REM sleep averaged 96%. The total duration of oxygen < 90% was 0.0 minutes. ? ?Cardiac monitoring- There were no significant cardiac rhythm irregularities.  ? ?Periodic limb movement monitoring- demonstrated that there were 130 periodic limb movements for a periodic limb movement index of 24.8 periodic limb movements per hour of sleep.  ? ?Impression: ?This patient's obstructive sleep apnea demonstrated significant improvement with the utilization of nasal CPAP at 9 cm H2O.  ? ?There was a significantly elevated periodic limb movement index of 24.8 periodic limb movements per hour of sleep. These limb movements were also observed during the initial polysomnogram. Treatment may be indicated if sleep disruption or sleepiness persist once the patient is fully compliant with CPAP. ? ?Recommendations: ?Would recommend utilization of nasal CPAP at 9 cm H2O.      ?A large ResMed N20, was used. Chin strap used during study- no. Humidifier used during study- yes.  ? ? ?Allyne Gee, MD, FCCP ?Diplomate ABMS-Pulmonary, Critical Care and Sleep Medicine  ?Electronically reviewed and digitally signed ? ? ?[facility] ?CPAP/BIPAP Polysomnogram Report ?Part II ?Phone: [phone] ?Fax: [phone] ? ?Patient last name Ala Neck Size 19.0 in. Acquisition [id number]  ?Patient first name Christian Hubbard Weight 300.0 lbs. Started 03/15/2022 at 12:03:27 AM  ?Birth date July 26, 1961 Height 74.0 in. Stopped 03/15/2022 at 6:33:39 AM  ?Age 18      ?Type Adult BMI 38.5 lb/in2 Duration 369.2  ?Report generated by Paulo Fruit., RPSGT  Reviewed by: Richelle Ito. Saunders Glance, PhD, ABSM, FAASM ?Sleep Data: ?  Lights Out: 12:12:09 AM Sleep Onset: 12:14:27 AM  ?Lights On: 6:21:21 AM Sleep Efficiency: 85.0 %  ?Total Recording Time: 369.2 min Sleep Latenc (from Lights Off) 2.3 min  ?Total Sleep Time (TST): 314.0 min R Latency (from  Sleep Onset): 44.5 min  ?Sleep Period Time: 358.0 min Total number of awakenings: 42  ?Wake during sleep: 44.0 min Wake After Sleep Onset (WASO): 52.9 min  ? ?Sleep Data:         Arousal Summary: ?Stage  ?Latency from lights out (min) Latency from sleep onset (min) Duration (min) % Total Sleep Time  ?Normal values  ?N 1 2.3 0.0 50.0 15.9 (5%)  ?N 2 6.3 4.0 155.0 49.4 (50%)  ?N 3 119.3 117.0 9.0 2.9 (20%)  ?R 46.8 44.5 100.0 31.8 (25%)  ? ? Number Index  ?Spontaneous 113 21.6  ?Apneas & Hypopneas 20 3.8  ?RERAs 0 0.0  ?     (Apneas & Hypopneas & RERAs)  (20) (3.8)  ?Limb Movement 22 4.2  ?Snore 0 0.0  ?TOTAL 155 29.6  ? ? ? ?Respiratory Data: ? CA OA MA Apnea Hypopnea* A+ H RERA Total  ?Number 0 '1 2 3 21 24 '$ 0 24  ?Mean Dur (sec) 0.0 18.0 15.5 16.3 26.7 25.4 0.0 25.4  ?Max Dur (sec) 0.0 18.0 17.5 18.0 51.5 51.5 0.0 51.5  ?Total Dur (min) 0.0 0.3 0.5 0.8 9.3 10.2 0.0 10.2  ?% of TST 0.0 0.1 0.2 0.3 3.0 3.2 0.0 3.2  ?Index (#/h TST) 0.0 0.2 0.4 0.6 4.0 4.6 0.0 4.6  ?*Hypopneas scored based on 4% or greater desaturation. ? ?Sleep Stage:     ? ? ? ? REM NREM TST  ?AHI 1.8 5.6 4.6  ?RDI 1.8 5.6 4.6  ? ? Sleep ?(min) TST ?(%) REM ?(min) NREM ?(min) CA ?(#) OA ?(#) MA ?(#) HYP ?(#) AHI ?(#/h) RERA ?(#) RDI ?(#/h) Desat ?(#)  ?Supine 314.0 100.00 100.0 214.0 0 '1 2 21 '$ 4.6 0 4.6 32  ?Non-Supine 0.00 0.00 0.00 0.00 0.00 0.00 0.00 0.00 0.00 0 0.00 0.00  ?  ? ?Snoring: ?Total number of snoring episodes  0  ?Total time with snoring    min (   % of sleep)  ? ?Oximetry Distribution: ?            WK REM NREM TOTAL  ?Average (%)   97 96 96 96  ?< 90% 0.0 0.0 0.0 0.0  ?< 80% 0.0 0.0 0.0 0.0  ?< 70% 0.0 0.0 0.0 0.0  ?# of Desaturations* '4 4 22 30  '$ ?Desat Index (#/hour) 4.3 2.4 6.2 5.7  ?Desat Max (%) '15 5 9 15  '$ ?Desat Max Dur (sec) 34.0 74.0 83.0 83.0  ?Approx Min O2 during sleep 89  ?Approx min O2 during a respiratory event 89  ?Was Oxygen added (Y/N) and final rate No:   0 LPM  ?*Desaturations based on 4% or greater drop from  baseline. ? ? Cheyne Stokes Breathing: None Present  ? ? ?Heart Rate Summary:  ?Average Heart Rate During Sleep 67.1 bpm      ?Highest Heart Rate During Sleep (95th %) 71.0 bpm      ?Highest Heart Rate During Sleep 145 bpm      ?Highest Heart Rate During Recording (TIB) 171 bpm (artifact)  ? ?Heart Rate Observations: ?Event Type # Events   ?Bradycardia 0 Lowest HR Scored: N/A  ?Sinus Tachycardia During Sleep 0 Highest HR Scored: N/A  ?Narrow Complex Tachycardia 0 Highest HR  Scored: N/A  ?Wide Complex Tachycardia 0 Highest HR Scored: N/A  ?Asystole 0 Longest Pause: N/A  ?Atrial Fibrillation 0 Duration Longest Event: N/A  ?Other Arrythmias  No Type:   ?Periodic Limb Movement Data: (Primary legs unless otherwise noted) ?Total # Limb Movement 155 Limb Movement Index 29.6  ?Total # PLMS 130 PLMS Index 24.8  ?Total # PLMS Arousals 10 PLMS Arousal Index 1.9  ?Percentage Sleep Time with PLMS 61.31mn (19.7 % sleep)  ?Mean Duration limb movements (secs) 3702.5  ? ? ?IPAP Level ?(cmH2O) EPAP Level ?(cmH2O) Total Duration (min) Sleep Duration (min) Sleep (%) REM (%) CA  #) OA # MA # HYP #) AHI ?(#/hr) RERAs # RERAs (#/hr) RDI (#/hr)  ?4 4 39.8 37.3 93.7 0.0 0 0 2 18 32.2 0 0.0 32.2  ?6 6 12.5 10.5 84.0 48.0 0 0 0 0 0.0 0 0.0 0.0  ?7 7 107.3 103.8 96.7 30.5 0 0 0 2 1.2 0 0.0 1.2  ?8 8 78.0 77.0 98.7 34.6 0 1 0 0 0.8 0 0.0 0.8  ?9 9 75.1 59.6 79.4 45.3 0 0 0 1 1.0 0 0.0 1.0  ?10 10 43.8 24.3 55.5 0.0 0 0 0 0 0.0 0 0.0 0.0  ?                                           ?                                           ?                                           ?                                           ?                                           ?                                           ?                                           ?                                           ?                                           ?                                           ?                                           ?                                            ?                                           ?                                           ?                                           ? ? ? ? ? ? ?

## 2022-05-26 ENCOUNTER — Other Ambulatory Visit: Payer: Self-pay | Admitting: Thoracic Surgery (Cardiothoracic Vascular Surgery)

## 2022-05-26 DIAGNOSIS — J9859 Other diseases of mediastinum, not elsewhere classified: Secondary | ICD-10-CM

## 2022-06-16 ENCOUNTER — Ambulatory Visit: Payer: 59 | Admitting: Thoracic Surgery (Cardiothoracic Vascular Surgery)

## 2022-07-21 ENCOUNTER — Other Ambulatory Visit: Payer: 59

## 2022-07-21 ENCOUNTER — Ambulatory Visit: Payer: 59 | Admitting: Thoracic Surgery (Cardiothoracic Vascular Surgery)

## 2022-07-27 ENCOUNTER — Other Ambulatory Visit: Payer: 59

## 2022-07-28 ENCOUNTER — Other Ambulatory Visit: Payer: 59

## 2022-07-28 ENCOUNTER — Ambulatory Visit: Payer: 59 | Admitting: Thoracic Surgery (Cardiothoracic Vascular Surgery)

## 2022-09-01 ENCOUNTER — Ambulatory Visit
Admission: RE | Admit: 2022-09-01 | Discharge: 2022-09-01 | Disposition: A | Discharge status: Home / Self Care | Payer: 59 | Source: Ambulatory Visit | Attending: Thoracic Surgery (Cardiothoracic Vascular Surgery) | Admitting: Thoracic Surgery (Cardiothoracic Vascular Surgery)

## 2022-09-01 ENCOUNTER — Ambulatory Visit: Payer: 59 | Admitting: Thoracic Surgery (Cardiothoracic Vascular Surgery)

## 2022-09-01 VITALS — BP 143/86 | HR 82 | Resp 18 | Ht 74.0 in | Wt 320.0 lb

## 2022-09-01 DIAGNOSIS — J9859 Other diseases of mediastinum, not elsewhere classified: Secondary | ICD-10-CM

## 2022-09-01 NOTE — Progress Notes (Signed)
      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  This was originally found incidentally.  He has no complaints since his last appointment last year.  Physical Exam: BP (!) 143/86 (BP Location: Left Arm, Patient Position: Sitting)   Pulse 82   Resp 18   Ht '6\' 2"'$  (1.88 m)   Wt (!) 320 lb (145.2 kg)   SpO2 94% Comment: RA  BMI 41.09 kg/m   Alert NAD Abdomen, ND No peripheral edema   Diagnostic Studies & Laboratory data: CT chest: Mediastinum/Nodes: Small hiatal hernia. Thyroid is unremarkable. No pathologically enlarged lymph nodes seen in the chest. Nodular soft tissue of the anterior mediastinum again seen. Anterior mediastinal nodule located anterior to the aorta measures 1.8 x 0.7 cm on series 2, image 59, not significantly changed in size when compared prior exam. Additional more superiorly located mediastinal nodule measuring 1.5 x 0.8 cm on image 51, unchanged in size.   Lungs/Pleura: Central airways are patent. Multiloculated cavitary lesion of the right lower lobe again seen. Largest cavitary component measures 4.4 x 3.9 cm on series 8, image 87, unchanged when compared with prior exam. Nodular soft tissue is again seen within the largest cavity measuring 1.2 x 0.8 cm on series 2, image 89, previously measured 0.9 x 0.8 cm, slightly increased in size, but demonstrates a different location when compared with prior. Solid nodular lesion located adjacent to the cavities measuring 1.1 x 0.6 cm on series 8, image 76, previously 0.4 x 0.8 cm, slightly decreased in size. Small solid pulmonary nodule of  the right upper lobe measuring 5 mm on series 8, image 51, stable in size when compared with multiple priors, considered benign given greater than 1 year stability.  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  He also has a cystic lesion in his right lung.  In regards to the mediastinal mass we will continue to follow this since he does not want surgery at this point.  In regards to the cystic lesion and I will refer him back to Dr. Elsworth Soho for further evaluation of this.  We will follow-up with him in 1 year.   Lajuana Matte 09/01/2022 6:17 PM

## 2022-09-22 ENCOUNTER — Institutional Professional Consult (permissible substitution): Payer: 59 | Admitting: Pulmonary Disease

## 2022-10-25 ENCOUNTER — Encounter (HOSPITAL_BASED_OUTPATIENT_CLINIC_OR_DEPARTMENT_OTHER): Payer: Self-pay | Admitting: Pulmonary Disease

## 2022-10-25 ENCOUNTER — Other Ambulatory Visit: Payer: Self-pay

## 2022-10-25 ENCOUNTER — Ambulatory Visit (INDEPENDENT_AMBULATORY_CARE_PROVIDER_SITE_OTHER): Payer: 59 | Admitting: Pulmonary Disease

## 2022-10-25 ENCOUNTER — Ambulatory Visit (HOSPITAL_BASED_OUTPATIENT_CLINIC_OR_DEPARTMENT_OTHER): Payer: 59 | Admitting: Pulmonary Disease

## 2022-10-25 VITALS — BP 124/72 | HR 71 | Temp 98.4°F | Ht 74.0 in | Wt 328.0 lb

## 2022-10-25 DIAGNOSIS — R911 Solitary pulmonary nodule: Secondary | ICD-10-CM | POA: Diagnosis not present

## 2022-10-25 DIAGNOSIS — G4733 Obstructive sleep apnea (adult) (pediatric): Secondary | ICD-10-CM

## 2022-10-25 DIAGNOSIS — R0609 Other forms of dyspnea: Secondary | ICD-10-CM

## 2022-10-25 DIAGNOSIS — J984 Other disorders of lung: Secondary | ICD-10-CM

## 2022-10-25 LAB — PULMONARY FUNCTION TEST
DL/VA % pred: 164 %
DL/VA: 6.87 ml/min/mmHg/L
DLCO cor % pred: 90 %
DLCO cor: 28.24 ml/min/mmHg
DLCO unc % pred: 90 %
DLCO unc: 28.24 ml/min/mmHg
FEF 25-75 Post: 4.12 L/sec
FEF 25-75 Pre: 3.18 L/sec
FEF2575-%Change-Post: 29 %
FEF2575-%Pred-Post: 120 %
FEF2575-%Pred-Pre: 93 %
FEV1-%Change-Post: 5 %
FEV1-%Pred-Post: 67 %
FEV1-%Pred-Pre: 64 %
FEV1-Post: 2.83 L
FEV1-Pre: 2.68 L
FEV1FVC-%Change-Post: 3 %
FEV1FVC-%Pred-Pre: 111 %
FEV6-%Change-Post: 2 %
FEV6-%Pred-Post: 61 %
FEV6-%Pred-Pre: 60 %
FEV6-Post: 3.24 L
FEV6-Pre: 3.16 L
FEV6FVC-%Pred-Post: 104 %
FEV6FVC-%Pred-Pre: 104 %
FVC-%Change-Post: 2 %
FVC-%Pred-Post: 59 %
FVC-%Pred-Pre: 57 %
FVC-Post: 3.24 L
FVC-Pre: 3.16 L
Post FEV1/FVC ratio: 87 %
Post FEV6/FVC ratio: 100 %
Pre FEV1/FVC ratio: 85 %
Pre FEV6/FVC Ratio: 100 %
RV % pred: 76 %
RV: 1.88 L
TLC % pred: 75 %
TLC: 5.85 L

## 2022-10-25 MED ORDER — ALBUTEROL SULFATE HFA 108 (90 BASE) MCG/ACT IN AERS: 2.0000 | INHALATION_SPRAY | Freq: Four times a day (QID) | RESPIRATORY_TRACT | 2 refills | Status: DC | PRN

## 2022-10-25 NOTE — Patient Instructions (Addendum)
X schedule pFTS  X CT chest wo con in oct 2024

## 2022-10-25 NOTE — Assessment & Plan Note (Signed)
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  He has a small aspergilloma within the pneumatocele.  Does not need any intervention.  We will follow this on an annual basis for change in size of aspergilloma

## 2022-10-25 NOTE — Assessment & Plan Note (Addendum)
Will proceed with full PFTs >> reviewed PFTs, seem to show extraparenchymal restriction due to obesity, no significant airway obstruction.  Will provide albuterol MDI for symptomatic benefit

## 2022-10-25 NOTE — Progress Notes (Signed)
Subjective:    Patient ID: Christian Hubbard, male    DOB: 10/12/1961, 61 y.o.   MRN: [id number]  HPI  61  y/o former smoker referred by thoracic surgery for follow-up of abnormal imaging. I was following him for OSA & benign right upper lobe nodule, last seen in 2018 He quit smoking in 2006, about 20 pack years, worked in a [employer]  He presented in 07/2013 with a right lower lobe cavitary pneumonia  . 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  Positive for MAI DNA probe.   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  He also reports exposure to burn pits when he was in [facility], he follows with the [place] and is considered 90% disabled. He had a back injury while at work and has been out of work since January 2023 and has not planning to go back.  He is concerned that his breathing may be related to exposure to bone.'s  Since his last visit with me in 2018, he was found to have an anterior mediastinal mass and has been following up with thoracic surgery  He reports dyspnea on exertion and when talking for a long time he has to pause to take his breath. He has gained 12 pounds over the last 5 years OSA is followed by the [facility] [place], he is on nasal pillows and maintained on CPAP 12 cm  PMH -hypertension Diabetes Hyperlipidemia OSA   Significant tests/ events reviewed  PFTs 09/2022 >> no airway obstruction, ratio 85, moderate restriction, FVC 59%, TLC 75% with normal DLCO suggesting extraparenchymal restriction due to obesity  CT 2014 - RLL pna + AF level , large 10cm .  CT chest done 12/09/15 with stable 3 mm RUL nodule and RLL pneumatocele.   04/2017  CTA Chest >> neg for PE, widespread multifocal airspace consolidation compatible with severe multilobar PNA with evidence of  early cavitation in some regions, trace right pleural effusion, severe hepatic steatosis, 2 vessel CAD   06/2017: CT Chest w/ consolidation in the RLL w/ new multifocal peripheral consolidations  09/2019 PET scan -anterior mediastinal mass unchanged from 2018, new since 2016, SUV 3.5   08/2022 CT chest wo con >> Nodular soft tissue of the anterior mediastinum is unchanged in size when compared with prior exam. 2. Multiloculated cavitary lesion of the right lower lobe. 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 3. Previously described soft tissue nodularity along the superior margin of the cavity is slightly decreased in size   07/16/17 IgE 125.    Past Medical History:  Diagnosis Date   Depression    Diabetes mellitus without complication ([facility])    Erectile dysfunction    GERD (gastroesophageal reflux disease)    Hyperlipemia    Hypertension    Mediastinal mass    Followed by Dr. Kipp Brood, stable 06/2021, he preferred surveillance imaging over resection for diagnostic purposes (07/15/21)   OSA on CPAP    Pneumonia 08/10/2014; 07/10/2017    Past Surgical History:  Procedure Laterality Date   LIPOMA EXCISION Left 09/17/2019   Procedure: Excision of Left cheek lipoma;  Surgeon: Wallace Going, DO;  Location: [facility];  Service: Clinical cytogeneticist;  Laterality: Left;   LUMBAR LAMINECTOMY/DECOMPRESSION MICRODISCECTOMY N/A 03/16/2022   Procedure: Microlumbar decompression Microdiscectomy Lumbar four-five, Lumbar five-Sacral one;  Surgeon: Susa Day, MD;  Location: [facility];  Service: Orthopedics;  Laterality: N/A;   UMBILICAL HERNIA REPAIR N/A 09/17/2019   Procedure: UMBILICAL HERNIA REPAIR WITH MESH;  Surgeon: Jovita Kussmaul, MD;  Location: [facility];  Service: General;  Laterality: N/A;   VIDEO BRONCHOSCOPY Bilateral 07/02/2017    Procedure: VIDEO BRONCHOSCOPY WITH FLUORO;  Surgeon: Rigoberto Noel, MD;  Location: [facility];  Service: Cardiopulmonary;  Laterality: Bilateral;    No Known Allergies Social History   Socioeconomic History   Marital status: Divorced    Spouse name: Not on file   Number of children: Not on file   Years of education: Not on file   Highest education level: Not on file  Occupational History   Not on file  Tobacco Use   Smoking status: Former    Packs/day: 1.00    Years: 24.00    Total pack years: 24.00    Types: Cigarettes    Quit date: 11/27/2004    Years since quitting: 17.9    Passive exposure: Past   Smokeless tobacco: Never  Vaping Use   Vaping Use: Never used  Substance and Sexual Activity   Alcohol use: Yes    Alcohol/week: 6.0 standard drinks of alcohol    Types: 6 Cans of beer per week    Comment: social   Drug use: No   Sexual activity: Not Currently  Other Topics Concern   Not on file  Social History Narrative   Not on file   Social Determinants of Health   Financial Resource Strain: Not on file  Food Insecurity: Not on file  Transportation Needs: Not on file  Physical Activity: Not on file  Stress: Not on file  Social Connections: Not on file  Intimate Partner Violence: Not on file    Family History  Problem Relation Age of Onset   Diabetes Mother    Asthma Sister       Review of Systems Constitutional: negative for anorexia, fevers and sweats  Eyes: negative for irritation, redness and visual disturbance  Ears, nose, mouth, throat, and face: negative for earaches, epistaxis, nasal congestion and sore throat  Respiratory: negative for cough, dyspnea on exertion, sputum and wheezing  Cardiovascular: negative for chest pain, dyspnea, lower extremity edema, orthopnea, palpitations and syncope  Gastrointestinal: negative for abdominal pain, constipation, diarrhea, melena, nausea and vomiting  Genitourinary:negative for dysuria, frequency and  hematuria  Hematologic/lymphatic: negative for bleeding, easy bruising and lymphadenopathy  Musculoskeletal:negative for arthralgias, muscle weakness and stiff joints  Neurological: negative for coordination problems, gait problems, headaches and weakness  Endocrine: negative for diabetic symptoms including polydipsia, polyuria and weight loss     Objective:   Physical Exam  Gen. Pleasant, obese, in no distress, normal affect ENT - no pallor,icterus, no post nasal drip, class 2-3 airway Neck: No JVD, no thyromegaly, no carotid bruits Lungs: no use of accessory muscles, no dullness to percussion, decreased without rales or rhonchi  Cardiovascular: Rhythm regular, heart sounds  normal, no murmurs or gallops, no peripheral edema Abdomen: soft and non-tender, no hepatosplenomegaly, BS normal. 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  The intubated central mass is new since 2016 but has been present since 2018 and is unchanged and differential includes thymoma or low-grade lymphoproliferative disorder. He smoked about 20 pack years and was exposed to chemicals and burn pits at [facility], he is concerned about shortness of breath pain related to this

## 2022-10-25 NOTE — Progress Notes (Signed)
Full PFT Performed Today  

## 2022-10-25 NOTE — Assessment & Plan Note (Signed)
Abdominal was reviewed which shows excellent control of events on 12 cm with good compliance and mild leak.  CPAP supplies are being obtained from the [place]

## 2022-10-25 NOTE — Patient Instructions (Signed)
Full PFT Performed Today  

## 2022-10-25 NOTE — Assessment & Plan Note (Signed)
Noted at the periphery of the pneumatocele.  We will follow this with annual CT scan

## 2023-05-29 ENCOUNTER — Encounter: Payer: Self-pay | Admitting: Dietician

## 2023-05-29 ENCOUNTER — Encounter: Payer: No Typology Code available for payment source | Attending: Internal Medicine | Admitting: Dietician

## 2023-05-29 NOTE — Progress Notes (Signed)
Medical Nutrition Therapy  Appointment Start time:  9:15  Appointment End time:  10:23  Primary concerns today: get weight off  Referral diagnosis: Morbid Obesity Preferred learning style: no preference indicated (auditory, visual, hands on, no preference indicated) Learning readiness: preparation (not ready, contemplating, ready, change in progress)  NUTRITION ASSESSMENT   Anthropometrics  Weight: 337.6 lbs. Height: 74 in  Clinical Medical Hx: HTN, hypercholesterolemia, obesity, sleep apnea, T2DM Medications: albuterol (VENTOLIN HFA) 108 (90 Base) MCG/ACT inhaler amLODipine (NORVASC) 5 MG tablet Decussate  CELEBREX 200 MG capsule docusate sodium (COLACE) 100 MG capsule losartan-hydrochlorothiazide (HYZAAR) 100-12.5 MG tablet metFORMIN (GLUCOPHAGE) 500 MG tablet NEURONTIN 300 MG capsule omeprazole (PRILOSEC) 40 MG capsule OneTouch Delica Lancets 33G MISC ONETOUCH VERIO test strip polyethylene glycol (MIRALAX / GLYCOLAX) 17 g packet Propylene Glycol (SYSTANE BALANCE) 0.6 % SOLN Semaglutide (OZEMPIC, 0.25 OR 0.5 MG/DOSE, [place]) Labs: glucose 122; A1c 7.9 Notable Signs/Symptoms: none noted  Lifestyle & Dietary Hx  Pt arrived using a cane. Pt states he checks his blood sugar, stating today fasting it was 110. Pt states his A1c jumped up recently, stating it was 6.4% two months before. A1c in April was 7.9%. Pt states he has been trying to eat [place] subs and get off fast food. Pt states his pain comes from knee, back and ankles.  Estimated daily fluid intake: 48-96 oz Supplements: n/a Sleep: not good; pt states he gets 2-4 hours a night. Pt states he uses a CPAP machine. Stress / self-care: n/a; PTSD Current average weekly physical activity: walk to the corner and back about twice a week.  24-Hr Dietary Recall First Meal: skip or sausage egg mc muffin Snack: chips or candy Second Meal: take out like [place] sub from [employer] Snack: nuts Third Meal: [place] food (dine in or  take out) Snack: chips or candy or nuts Beverages: water, beer (2-3 drinks a week), sweet tea  Estimated Energy Needs Calories: 2000  NUTRITION DIAGNOSIS  NB-1.1 Food and nutrition-related knowledge deficit As related to lack of nutrition related education. As evidenced by weight gain and jump in A1c.    NUTRITION INTERVENTION  Nutrition education (E-1) on the following topics:  Why you need complex carbohydrates: Whole grains and other complex carbohydrates are required to have a healthy diet. Whole grains provide fiber which can help with blood glucose levels and help keep you satiated. Fruits and starchy vegetables provide essential vitamins and minerals required for immune function, eyesight support, brain support, bone density, wound healing and many other functions within the body. According to the current evidenced based 2020-2025 Dietary Guidelines for Americans, complex carbohydrates are part of a healthy eating pattern which is associated with a decreased risk for type 2 diabetes, cancers, and cardiovascular disease.  Fruits & Vegetables: Aim to fill half your plate with a variety of fruits and vegetables. They are rich in vitamins, minerals, and fiber, and can help reduce the risk of chronic diseases. Choose a colorful assortment of fruits and vegetables to ensure you get a wide range of nutrients. Grains and Starches: Make at least half of your grain choices whole grains, such as [place] rice, whole wheat bread, and oats. Whole grains provide fiber, which aids in digestion and healthy cholesterol levels. Aim for whole forms of starchy vegetables such as potatoes, sweet potatoes, beans, peas, and corn, which are fiber rich and provide many vitamins and minerals.  Protein: Incorporate lean sources of protein, such as poultry, fish, beans, nuts, and seeds, into your meals. Protein is  essential for building and repairing tissues, staying full, balancing blood sugar, as well as supporting immune  function. Dairy: Include low-fat or fat-free dairy products like milk, yogurt, and cheese in your diet. Dairy foods are excellent sources of calcium and vitamin D, which are crucial for bone health.  Physical Activity: Regular physical activity promotes overall health-including helping to reduce risk for heart disease and diabetes, promoting mental health, and helping [place] sleep better.  Encouraged pt to continue to eat balanced meals inclusive of non starchy vegetables 2 times a day 7 days a week Encouraged pt to choose lean protein sources: limiting beef, pork, sausage, hotdogs, and lunch meat Encourage pt to choose healthy fats such as plant based limiting animal fats Encouraged pt to continue to drink a minium 64 fluid ounces with half being plain water to satisfy proper hydration     Handouts Provided Include  Meal Ideas handout Types of Fat (saturated vs unsaturated) Health Benefits of Physical Activity  Learning Style & Readiness for Change Teaching method utilized: Visual & Auditory  Demonstrated degree of understanding via: Teach Back  Barriers to learning/adherence to lifestyle change: PTSD  Goals Established by Pt Avoid skipping meals; eat 3 smaller meals a day. Increasing complex carbohydrates; decrease simple carbohydrates; try more whole grains; like 100% whole wheat bread Increase physical activity; one hour on the elliptical daily Take your blood sugar two hours after a meal and fasting (first thing in the morning)   MONITORING & EVALUATION Dietary intake, weekly physical activity.  Next Steps  Patient is to return in 2 months for follow-up.

## 2023-07-31 ENCOUNTER — Ambulatory Visit: Payer: Self-pay | Admitting: Dietician

## 2023-08-01 ENCOUNTER — Ambulatory Visit: Payer: 59 | Admitting: Internal Medicine

## 2023-08-06 ENCOUNTER — Other Ambulatory Visit: Payer: Self-pay | Admitting: Thoracic Surgery (Cardiothoracic Vascular Surgery)

## 2023-08-06 DIAGNOSIS — J9859 Other diseases of mediastinum, not elsewhere classified: Secondary | ICD-10-CM

## 2023-08-08 ENCOUNTER — Encounter: Payer: Self-pay | Admitting: Internal Medicine

## 2023-08-08 ENCOUNTER — Other Ambulatory Visit: Payer: Self-pay

## 2023-08-08 ENCOUNTER — Ambulatory Visit (INDEPENDENT_AMBULATORY_CARE_PROVIDER_SITE_OTHER): Payer: No Typology Code available for payment source | Admitting: Internal Medicine

## 2023-08-08 VITALS — BP 132/82 | HR 78 | Temp 98.5°F | Wt 338.6 lb

## 2023-08-08 DIAGNOSIS — R499 Unspecified voice and resonance disorder: Secondary | ICD-10-CM

## 2023-08-08 DIAGNOSIS — K219 Gastro-esophageal reflux disease without esophagitis: Secondary | ICD-10-CM | POA: Diagnosis not present

## 2023-08-08 DIAGNOSIS — Z8619 Personal history of other infectious and parasitic diseases: Secondary | ICD-10-CM

## 2023-08-08 DIAGNOSIS — R053 Chronic cough: Secondary | ICD-10-CM

## 2023-08-08 DIAGNOSIS — J310 Chronic rhinitis: Secondary | ICD-10-CM

## 2023-08-08 MED ORDER — FAMOTIDINE 20 MG PO TABS: 20.0000 mg | ORAL_TABLET | Freq: Every day | ORAL | 5 refills | Status: AC | PRN

## 2023-08-08 MED ORDER — IPRATROPIUM BROMIDE 0.03 % NA SOLN: 2.0000 | Freq: Three times a day (TID) | NASAL | 5 refills | Status: DC | PRN

## 2023-08-08 MED ORDER — FLUTICASONE PROPIONATE 50 MCG/ACT NA SUSP: 2.0000 | Freq: Every day | NASAL | 5 refills | Status: DC

## 2023-08-08 NOTE — Progress Notes (Signed)
NEW PATIENT  Date of Service/Encounter:  08/08/23  Consult requested by: Sherwood Gambler, MD   Subjective:   Christian Hubbard (DOB: Sep 29, 1961) is a 62 y.o. male who presents to the clinic on 08/08/2023 with a chief complaint of Cough (Coughing every now and then. Dry throat.) and Establish Care .    History obtained from: chart review and patient.   Chronic Cough/Reflux/Post nasal drip No history of asthma. Has had coughing for the past 5-10 years.  Has had pneumonia in the past and has been on and off prednisone also. Not sure for what reason.   He has trouble with dry cough, loss of voice and has had drooling issues with speaking.  Has not seen ENT recently.  Feels like the dry air causes him to cough.  He has OSA on CPAP.  He follows with CT surgery, Dr Cliffton Asters for a mediastinal mass.  He previously saw Dr. 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  Had changes in insurance so did not follow up.   Previously was on Albuterol PRN; not sure if this helped with the cough.   He thinks he has some drainage in the back of his throat.  Also with runny nose, not much congestion/sneezing. Happening all year around.  No sinus surgeries.  No allergy testing in the past.  He uses Flonase and Atrovent nose spray PRN.  Also takes some allergy medication.   He has burn pit exposure through the [employer].  He does have reflux and is on Omperazole daily but still has some heartburn throughout the day.   Past Medical History: Past Medical History:  Diagnosis Date   Depression    Diabetes mellitus without complication (HCC)    Erectile dysfunction    GERD (gastroesophageal reflux disease)    Hyperlipemia    Hypertension    Mediastinal mass    Followed by Dr. Cliffton Asters, stable 06/2021, he preferred surveillance imaging over resection for diagnostic purposes (07/15/21)   OSA on CPAP    Pneumonia 08/10/2014; 07/10/2017   Past Surgical History: Past Surgical History:   Procedure Laterality Date   LIPOMA EXCISION Left 09/17/2019   Procedure: Excision of Left cheek lipoma;  Surgeon: Peggye Form, DO;  Location: [facility];  Service: Plastics;  Laterality: Left;   LUMBAR LAMINECTOMY/DECOMPRESSION MICRODISCECTOMY N/A 03/16/2022   Procedure: Microlumbar decompression Microdiscectomy Lumbar four-five, Lumbar five-Sacral one;  Surgeon: Jene Every, MD;  Location: [facility];  Service: Orthopedics;  Laterality: N/A;   UMBILICAL HERNIA REPAIR N/A 09/17/2019   Procedure: UMBILICAL HERNIA REPAIR WITH MESH;  Surgeon: Griselda Miner, MD;  Location: [facility];  Service: General;  Laterality: N/A;   VIDEO BRONCHOSCOPY Bilateral 07/02/2017   Procedure: VIDEO BRONCHOSCOPY WITH FLUORO;  Surgeon: Oretha Milch, MD;  Location: [facility];  Service: Cardiopulmonary;  Laterality: Bilateral;    Family History: Family History  Problem Relation Age of Onset   Diabetes Mother    Asthma Sister     Social History:  Flooring in bedroom: carpet Pets: none Tobacco use/exposure: 1996-2006, 1/2ppd.  Job: previously Veterinary surgeon; also exposed to burn pits in [employer]   Medication List:  Allergies as of 08/08/2023   No Known Allergies      Medication List        Accurate as of August 08, 2023 11:40 AM. If you have any questions, ask your nurse or doctor.          albuterol 108 (  90 Base) MCG/ACT inhaler Commonly known as: VENTOLIN HFA Inhale 2 puffs into the lungs every 6 (six) hours as needed for wheezing or shortness of breath.   amLODipine 5 MG tablet Commonly known as: NORVASC Take 5 mg by mouth daily.   CeleBREX 200 MG capsule Generic drug: celecoxib Take 1 capsule every day by oral route as needed for 30 days.   docusate sodium 100 MG capsule Commonly known as: Colace Take 1 capsule (100 mg total) by mouth 2 (two) times daily as needed for mild constipation.   famotidine 20 MG tablet Commonly known as:  Pepcid Take 1 tablet (20 mg total) by mouth daily as needed for heartburn. Started by: Birder Robson   fluticasone 50 MCG/ACT nasal spray Commonly known as: FLONASE Place 2 sprays into both nostrils daily. Started by: Birder Robson   ipratropium 0.03 % nasal spray Commonly known as: ATROVENT Place 2 sprays into the nose 3 (three) times daily as needed for rhinitis (drainage, runny nose). What changed:  when to take this reasons to take this Changed by: Ellen Henri Estoria Geary   lidocaine 5 % ointment Commonly known as: XYLOCAINE Apply 1 Application topically daily as needed.   losartan-hydrochlorothiazide 100-12.5 MG tablet Commonly known as: HYZAAR Take 1 tablet by mouth daily.   metFORMIN 500 MG tablet Commonly known as: GLUCOPHAGE Take 500 mg by mouth 2 (two) times daily with a meal.   Neurontin 300 MG capsule Generic drug: gabapentin Take 1 capsule 3 times a day by oral route as needed.   omeprazole 40 MG capsule Commonly known as: PRILOSEC Take 40 mg by mouth daily.   OneTouch Delica Lancets 33G Misc   OneTouch Verio test strip Generic drug: glucose blood USE TO CHECK BLOOD SUGARS ONCE DAILY ALTERNATING MORNING AND EVENINGS BEFORE MEALS   OZEMPIC (0.25 OR 0.5 MG/DOSE) [place] Inject into the skin.   polyethylene glycol 17 g packet Commonly known as: MIRALAX / GLYCOLAX Take 17 g by mouth daily.   pravastatin 40 MG tablet Commonly known as: PRAVACHOL Take 40 mg by mouth daily.   sildenafil 100 MG tablet Commonly known as: VIAGRA Take 100 mg by mouth as needed.   sulfamethoxazole-trimethoprim 800-160 MG tablet Commonly known as: BACTRIM DS Take 1 tablet by mouth once.   Systane Balance 0.6 % Soln Generic drug: Propylene Glycol Place 1 drop into both eyes daily as needed (dry eyes).         REVIEW OF SYSTEMS: Pertinent positives and negatives discussed in HPI.   Objective:   Physical Exam: BP 132/82   Pulse 78   Temp 98.5 F (36.9 C) (Temporal)   Wt  (!) 338 lb 9.6 oz (153.6 kg)   SpO2 96%   BMI 43.47 kg/m  Body mass index is 43.47 kg/m. GEN: alert, well developed HEENT: clear conjunctiva, TM grey and translucent, nose with + inferior turbinate hypertrophy, pink nasal mucosa, slight clear rhinorrhea, + cobblestoning HEART: regular rate and rhythm, no murmur LUNGS: clear to auscultation bilaterally, no coughing, unlabored respiration ABDOMEN: soft, non distended  SKIN: no rashes or lesions  Reviewed:  03/15/2022: seen by Dr Welton Flakes for OSA, underwent sleep study. Noted to have 24 respiratory events.  Titrated CPAP to 9cm H2O with significant improvement. Also with periodic limb movements.   09/01/2022: CT Chest IMPRESSION: 1. Nodular soft tissue of the anterior mediastinum is unchanged in size when compared with prior exam. 2. Multiloculated cavitary lesion of the right lower lobe. 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 3. Previously described soft tissue nodularity along the superior margin of the cavity is slightly decreased in size. 4. Aortic Atherosclerosis (ICD10-I70.0).  Self interpreted PFT 10/25/2022: low FEV1, FVC; normal ratio.  Normal DLCO.  Low ERV.  Restriction, likely due to obesity.   10/25/2022: saw Pulm Dr Vassie Loll for DOE.  Had a pneumatocele with small aspergilloma.  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  Smoked 20 pack years. Exposed to burn pits. Given albuterol for PRN use.  Plan to follow up the aspergilloma and central mass.    Spirometry:  Tracings reviewed. His effort: Good reproducible efforts. FVC: 3.10L, 69% FEV1: 2.37L, 68%% predicted FEV1/FVC ratio: 76% Interpretation: Spirometry consistent with possible restrictive disease.  Please see scanned spirometry results for details.  Skin Testing:  Skin prick testing was  placed, which includes aeroallergens/foods, histamine control, and saline control.  Verbal consent was obtained prior to placing test.  Patient tolerated procedure well.  Allergy testing results were read and interpreted by myself, documented by clinical staff. Adequate positive and negative control.  Results discussed with patient/family.  Airborne Adult Perc - 08/08/23 1016     Time Antigen Placed 1016   Simultaneous filing. User may not have seen previous data.   Allergen Manufacturer Greer   Simultaneous filing. User may not have seen previous data.   Location Back   Simultaneous filing. User may not have seen previous data.   Number of Test 55   Simultaneous filing. User may not have seen previous data.   Panel 1 Select    2. Control-Histamine 3+    3. Bahia Negative    4. French Southern Territories Negative    5. Johnson Negative    6. Kentucky Blue Negative    7. Meadow Fescue Negative    8. Perennial Rye Negative    9. Timothy Negative    10. Ragweed Mix Negative    11. Cocklebur Negative    12. Plantain,  English Negative    13. Baccharis Negative    14. Dog Fennel Negative    15. Russian Thistle Negative    16. Lamb's Quarters Negative    17. Sheep Sorrell Negative    18. Rough Pigweed Negative    19. Marsh Elder, Rough Negative    20. Mugwort, Common Negative    21. Box, Elder Negative    22. Cedar, red Negative    23. Sweet Gum Negative    24. Pecan Pollen Negative    25. Pine Mix Negative    26. Walnut, Black Pollen Negative    27. Red Mulberry Negative    28. Ash Mix Negative    29. Birch Mix Negative    30. Beech American Negative    31. Cottonwood, [place]iss[place] Negative    32. Hickory, White Negative    33. Maple Mix Negative    34. Oak, [place] Mix Negative    35. Sycamore Eastern Negative    36. Alternaria Alternata Negative    37. Cladosporium Herbarum Negative    38. Aspergillus Mix Negative    39. Penicillium Mix Negative    40. Bipolaris Sorokiniana (Helminthosporium)  Negative    41. Drechslera Spicifera (Curvularia) Negative    42. Mucor Plumbeus Negative    43. Fusarium Moniliforme Negative    44. Aureobasidium Pullulans (pullulara) Negative    45. Rhizopus Oryzae  Negative    46. Botrytis Cinera Negative    47. Epicoccum Nigrum Negative    48. Phoma Betae Negative    49. Dust Mite Mix Negative    50. Cat Hair 10,000 BAU/ml Negative    51.  Dog Epithelia Negative    52. Mixed Feathers Negative    53. Horse Epithelia Negative    54. Cockroach, German Negative    55. Tobacco Leaf Negative             Intradermal - 08/08/23 1050     Time Antigen Placed 1051    Allergen Manufacturer Greer    Location Arm    Number of Test 16    Intradermal Select    Control Negative    Bahia Negative    French Southern Territories Negative    Johnson Negative    7 Grass Negative    Ragweed Mix Negative    Weed Mix Negative    Tree Mix Negative    Mold 1 Negative    Mold 2 Negative    Mold 3 Negative    Mold 4 Negative    Mite Mix Negative    Cat Negative    Dog Negative    Cockroach Negative               Assessment:   1. Chronic cough   2. Gastroesophageal reflux disease, unspecified whether esophagitis present   3. Chronic rhinitis   4. History of aspergilloma   5. Change of voice     Plan/Recommendations:  Chronic Cough with Voice Changes  Hx of Aspergilloma - Discussed follow up with Pulm; has history of mediastinal mass and aspergilloma.  Was supposed to see Dr Vassie Loll but had change in insurance.  Referral to Pulm placed.  Also needs to discuss with [facility] in case they require [facility] referral.  - Can be related to uncontrolled GERD or post nasal drip.  Low suspicion for asthma; unresponsive to Albuterol and spirometry without obstruction. SPT today showed no aeroallergen triggers.  - If no improvement, will consider ENT referral as he is having voice changes.   Chronic Rhinitis: - Due to turbinate hypertrophy chronic cough and unresponsive to over  the counter meds, performed skin testing to identify aeroallergen triggers.   - Positive skin test 07/2023: none - Use nasal saline rinses before nose sprays such as with Neilmed Sinus Rinse.  Use distilled water.   - Use Flonase 2 sprays each nostril daily. Aim upward and outward. - Use Ipratroprium 1-2 sprays up to four times daily as needed for runny nose or post nasal drip. Aim upward and outward.  GERD - Continue Omeprazole 40mg  daily on empty stomach.  Eat a small meal about 30-45 minutes after. - Start Pepcid 20mg  daily as needed for heartburn/reflux.  -Avoid lying down for at least two hours after a meal or after drinking acidic beverages, like soda, or other caffeinated beverages. This can help to prevent stomach contents from flowing back into the esophagus. -Keep your head elevated while you sleep. Using an extra pillow or two can also help to prevent reflux. -Eat smaller and more frequent meals each day instead of a few large meals. This promotes digestion and can aid in preventing heartburn. -Wear loose-fitting clothes to ease pressure on the stomach, which can worsen heartburn and reflux. -Reduce excess weight around the midsection. This can ease pressure on the stomach. Such pressure can force some stomach contents back up the esophagus.  Return in about 2 months (around 10/08/2023).  Alesia Morin, MD [facility]

## 2023-08-08 NOTE — Patient Instructions (Addendum)
Chronic Cough - Discussed follow up with Pulm; has history of mediastinal mass and aspergilloma.  Was supposed to see Dr Vassie Loll but had change in insurance.  Referral to Pulm placed.  - Can be related to uncontrolled GERD or post nasal drip.  Low suspicion for asthma; unresponsive to Albuterol and spirometry without obstruction. SPT today showed no aeroallergen triggers.  - If no improvement, will consider ENT referral as he is having voice changes.  Chronic Rhinitis: - Positive skin test 07/2023: none - Use nasal saline rinses before nose sprays such as with Neilmed Sinus Rinse.  Use distilled water.   - Use Flonase 2 sprays each nostril daily. Aim upward and outward. - Use Ipratroprium 1-2 sprays up to four times daily as needed for runny nose or post nasal drip. Aim upward and outward.  GERD - Continue Omeprazole 40mg  daily on empty stomach.  Eat a small meal about 30-45 minutes after. - Start Pepcid 20mg  daily as needed for heartburn/reflux.  -Avoid lying down for at least two hours after a meal or after drinking acidic beverages, like soda, or other caffeinated beverages. This can help to prevent stomach contents from flowing back into the esophagus. -Keep your head elevated while you sleep. Using an extra pillow or two can also help to prevent reflux. -Eat smaller and more frequent meals each day instead of a few large meals. This promotes digestion and can aid in preventing heartburn. -Wear loose-fitting clothes to ease pressure on the stomach, which can worsen heartburn and reflux. -Reduce excess weight around the midsection. This can ease pressure on the stomach. Such pressure can force some stomach contents back up the esophagus.

## 2023-08-17 ENCOUNTER — Telehealth: Payer: Self-pay

## 2023-08-17 NOTE — Telephone Encounter (Signed)
Christian Hubbard, Can you assist with sending a request to the [place] ? Patient is needing to go back and see Dr. Vassie Loll with [facility]. The diagnosis is the following:  Chronic Cough with Voice Changes.  Thanks

## 2023-08-21 ENCOUNTER — Other Ambulatory Visit: Payer: Self-pay

## 2023-08-23 ENCOUNTER — Encounter: Payer: Self-pay | Admitting: Dietician

## 2023-08-23 ENCOUNTER — Encounter: Payer: No Typology Code available for payment source | Attending: Internal Medicine | Admitting: Dietician

## 2023-08-23 NOTE — Progress Notes (Signed)
Medical Nutrition Therapy Follow-up Appointment Start time:  9:32  Appointment End time:  10:23  Primary concerns today: get weight off  Referral diagnosis: Morbid Obesity Preferred learning style: no preference indicated (auditory, visual, hands on, no preference indicated) Learning readiness: preparation (not ready, contemplating, ready, change in progress)  NUTRITION ASSESSMENT   Anthropometrics  Start weight at [facility]: 337.6 lbs. (05/29/2023) Height: 74 in Weight today: 331.7 lb  Clinical Medical Hx: HTN, hypercholesterolemia, obesity, sleep apnea, T2DM Medications: albuterol (VENTOLIN HFA) 108 (90 Base) MCG/ACT inhaler amLODipine (NORVASC) 5 MG tablet Decussate  CELEBREX 200 MG capsule docusate sodium (COLACE) 100 MG capsule losartan-hydrochlorothiazide (HYZAAR) 100-12.5 MG tablet metFORMIN (GLUCOPHAGE) 500 MG tablet NEURONTIN 300 MG capsule omeprazole (PRILOSEC) 40 MG capsule OneTouch Delica Lancets 33G MISC ONETOUCH VERIO test strip polyethylene glycol (MIRALAX / GLYCOLAX) 17 g packet Propylene Glycol (SYSTANE BALANCE) 0.6 % SOLN Semaglutide (OZEMPIC, 0.25 OR 0.5 MG/DOSE, [place]) Labs: glucose 122; A1c 7.9 (pt reports 6.4 about 1.5 months ago) Notable Signs/Symptoms: none noted  Lifestyle & Dietary Hx  Pt states he felt pretty good this morning, stating he didn't feel like he needed his cane this morning. Pt states if he knows he will need to walk a lot, he will need his cane. Pt states he still only eats twice a day, stating he went and got some fruit so he would not snack on anything bad. Pt states he checked his blood sugar yesterday, fasting, stating it was 119. Pt states he drinks a lot of water, stating he still drinks sweet tea at a restaurant, stating he does not drink it all. Pt states he will also sub sweet tea with water. Pt states he will have a prostate biopsy next month. Pt reports that his A1c dropped back down to 6.4 about 1.5 months ago.  Estimated daily  fluid intake: 48-96 oz Supplements: n/a Sleep: not good; pt states he gets 2-4 hours a night. Pt states he uses a CPAP machine. Stress / self-care: n/a; PTSD Current average weekly physical activity: Pt states he uses a foot elliptical, stating he will do it 3-4 days a week for about an hour each day (20 minutes at a time).  24-Hr Dietary Recall First Meal: banana or bacon and egg sandwich on hamburger bun Snack: chips or candy Second Meal: take out like [place] sub from [place] Snack: nuts Third Meal: [place] food (dine in or take out) Snack: chips or candy or nuts Beverages: water, beer (2-3 drinks a week), sweet tea  Estimated Energy Needs Calories: 2000  NUTRITION DIAGNOSIS  NB-1.1 Food and nutrition-related knowledge deficit As related to lack of nutrition related education. As evidenced by weight gain and jump in A1c.    NUTRITION INTERVENTION  Nutrition education (E-1) on the following topics:  Why you need complex carbohydrates: Whole grains and other complex carbohydrates are required to have a healthy diet. Whole grains provide fiber which can help with blood glucose levels and help keep you satiated. Fruits and starchy vegetables provide essential vitamins and minerals required for immune function, eyesight support, brain support, bone density, wound healing and many other functions within the body. According to the current evidenced based 2020-2025 Dietary Guidelines for Americans, complex carbohydrates are part of a healthy eating pattern which is associated with a decreased risk for type 2 diabetes, cancers, and cardiovascular disease.  Fruits & Vegetables: Aim to fill half your plate with a variety of fruits and vegetables. They are rich in vitamins, minerals, and fiber, and can  help reduce the risk of chronic diseases. Choose a colorful assortment of fruits and vegetables to ensure you get a wide range of nutrients. Grains and Starches: Make at least half of your grain choices  whole grains, such as Emmarae Cowdery rice, whole wheat bread, and oats. Whole grains provide fiber, which aids in digestion and healthy cholesterol levels. Aim for whole forms of starchy vegetables such as potatoes, sweet potatoes, beans, peas, and corn, which are fiber rich and provide many vitamins and minerals.  Protein: Incorporate lean sources of protein, such as poultry, fish, beans, nuts, and seeds, into your meals. Protein is essential for building and repairing tissues, staying full, balancing blood sugar, as well as supporting immune function. Dairy: Include low-fat or fat-free dairy products like milk, yogurt, and cheese in your diet. Dairy foods are excellent sources of calcium and vitamin D, which are crucial for bone health.  Physical Activity: Regular physical activity promotes overall health-including helping to reduce risk for heart disease and diabetes, promoting mental health, and helping [place] sleep better.  Encouraged pt to continue to eat balanced meals inclusive of non starchy vegetables 2 times a day 7 days a week Encouraged pt to choose lean protein sources: limiting beef, pork, sausage, hotdogs, and lunch meat Encourage pt to choose healthy fats such as plant based limiting animal fats Encouraged pt to continue to drink a minium 64 fluid ounces with half being plain water to satisfy proper hydration   Reviewed Meal Ideas handout with patient. Created several meals. Encouraged pt to use meals to create shopping list to plan and prep meals. Encouraged pt to aim for 150 minutes of physical activity.   Handouts Provided Include  Carbohydrate Counting for People with Diabetes (ADA) Sample Meal Plan (ADA) Meal Ideas Handout  Learning Style & Readiness for Change Teaching method utilized: Visual & Auditory  Demonstrated degree of understanding via: Teach Back  Barriers to learning/adherence to lifestyle change: PTSD, mobility  Goals Established by Pt Continue: Avoid skipping meals; eat 3  smaller meals a day. Use Meal Ideas Handout Continue: Increasing complex carbohydrates; decrease simple carbohydrates; try more whole grains; like 100% whole wheat bread Continue: Increase physical activity; one hour on the elliptical daily Continue:Take your blood sugar two hours after a meal and fasting (first thing in the morning)  MONITORING & EVALUATION Dietary intake, weekly physical activity.  Next Steps  Patient will call for a follow-up appointment as needed.

## 2023-08-29 NOTE — Telephone Encounter (Signed)
Hey yes of course,  Looks like patient has a Advice worker for [facility] that was scanned in yesterday. Patient will be contacted by their office to schedule visits.

## 2023-09-21 ENCOUNTER — Telehealth: Payer: Self-pay | Admitting: Thoracic Surgery (Cardiothoracic Vascular Surgery)

## 2023-10-02 ENCOUNTER — Ambulatory Visit
Admission: RE | Admit: 2023-10-02 | Discharge: 2023-10-02 | Disposition: A | Discharge status: Home / Self Care | Payer: No Typology Code available for payment source | Source: Ambulatory Visit | Attending: Pulmonary Disease | Admitting: Pulmonary Disease

## 2023-10-02 ENCOUNTER — Other Ambulatory Visit: Payer: Non-veteran care

## 2023-10-02 DIAGNOSIS — R0609 Other forms of dyspnea: Secondary | ICD-10-CM

## 2023-10-02 DIAGNOSIS — R911 Solitary pulmonary nodule: Secondary | ICD-10-CM

## 2023-10-02 DIAGNOSIS — J984 Other disorders of lung: Secondary | ICD-10-CM

## 2023-10-09 ENCOUNTER — Ambulatory Visit (INDEPENDENT_AMBULATORY_CARE_PROVIDER_SITE_OTHER): Payer: No Typology Code available for payment source | Admitting: Internal Medicine

## 2023-10-09 ENCOUNTER — Other Ambulatory Visit: Payer: Self-pay

## 2023-10-09 ENCOUNTER — Encounter: Payer: Self-pay | Admitting: Internal Medicine

## 2023-10-09 VITALS — BP 134/84 | HR 88 | Temp 98.2°F | Wt 315.9 lb

## 2023-10-09 DIAGNOSIS — R053 Chronic cough: Secondary | ICD-10-CM

## 2023-10-09 DIAGNOSIS — R499 Unspecified voice and resonance disorder: Secondary | ICD-10-CM | POA: Diagnosis not present

## 2023-10-09 DIAGNOSIS — J310 Chronic rhinitis: Secondary | ICD-10-CM

## 2023-10-09 DIAGNOSIS — K219 Gastro-esophageal reflux disease without esophagitis: Secondary | ICD-10-CM

## 2023-10-09 MED ORDER — FLUTICASONE PROPIONATE 50 MCG/ACT NA SUSP: 2.0000 | Freq: Every day | NASAL | 5 refills | Status: DC

## 2023-10-09 MED ORDER — IPRATROPIUM BROMIDE 0.03 % NA SOLN: 2.0000 | Freq: Three times a day (TID) | NASAL | 5 refills | Status: DC | PRN

## 2023-10-09 NOTE — Patient Instructions (Addendum)
Chronic Cough, Dyspnea - Discussed follow up with Pulm; has history of mediastinal mass and aspergilloma.  Was supposed to see Dr Vassie Loll but had change in insurance and now has re-established care with them- appointment upcoming.  - Can be related to uncontrolled GERD or post nasal drip.   - In the past, low suspicion for asthma; unresponsive to Albuterol and spirometry without obstruction. - Still having voice changes with history of tobacco use and family hx of brother with throat cancer, so will refer to ENT.   Chronic Rhinitis: - Positive skin test 07/2023: none - Use nasal saline rinses before nose sprays such as with Neilmed Sinus Rinse.  Use distilled water.   - Use Flonase 2 sprays each nostril daily. Aim upward and outward. - Use Ipratroprium 1-2 sprays up to four times daily as needed for runny nose or post nasal drip. Aim upward and outward.  GERD - Continue Omeprazole 40mg  daily on empty stomach.  Eat a small meal about 30-45 minutes after. - Continue Pepcid 20mg  daily as needed for heartburn/reflux.  -Avoid lying down for at least two hours after a meal or after drinking acidic beverages, like soda, or other caffeinated beverages. This can help to prevent stomach contents from flowing back into the esophagus. -Keep your head elevated while you sleep. Using an extra pillow or two can also help to prevent reflux. -Eat smaller and more frequent meals each day instead of a few large meals. This promotes digestion and can aid in preventing heartburn. -Wear loose-fitting clothes to ease pressure on the stomach, which can worsen heartburn and reflux. -Reduce excess weight around the midsection. This can ease pressure on the stomach. Such pressure can force some stomach contents back up the esophagus.

## 2023-10-09 NOTE — Progress Notes (Signed)
FOLLOW UP Date of Service/Encounter:  10/09/23   Subjective:  Christian Hubbard (DOB: 08-23-1961) is a 62 y.o. male [facility] for follow up for chronic cough, chronic rhinitis, GERD.  History obtained from: chart review and patient. At last visit, we discussed following back up with Pulm as he has a hx of mediastinal mass + aspergilloma.  Spirometry normal and unresponsive to albuterol so discussed low suspicion for obstructive lung dx. SPT was also negative, discussed starting Flonase/Ipratropium for chronic rhinitis as PND can lead to cough.  Also has reflux and is on PPI, started on PRN Pepcid.   Since last visit, he has noted some improvement in his cough.  Does still have some congestion, drainage but improved.  Using Flonase daily and sometimes Atrovent.   He is still having trouble with voice changes with raspy voice and is very worried about that.  His brother passed from throat cancer.  He also has a smoking hx from 1990s-2006, highest use was 3/4ppd.   Reflux is doing okay, not too much heartburn, sour taste in mouth.  Using PPI daily, sometimes Pepcid.   Past Medical History: Past Medical History:  Diagnosis Date   Depression    Diabetes mellitus without complication (HCC)    Erectile dysfunction    GERD (gastroesophageal reflux disease)    Hyperlipemia    Hypertension    Mediastinal mass    Followed by Dr. Cliffton Asters, stable 06/2021, he preferred surveillance imaging over resection for diagnostic purposes (07/15/21)   OSA on CPAP    Pneumonia 08/10/2014; 07/10/2017    Objective:  BP (!) 140/78   Pulse 88   Temp 98.2 F (36.8 C) (Temporal)   Wt (!) 315 lb 14.4 oz (143.3 kg)   SpO2 96%   BMI 40.56 kg/m  Body mass index is 40.56 kg/m. Physical Exam: GEN: alert, well developed HEENT: clear conjunctiva, nose with mild inferior turbinate hypertrophy, pink nasal mucosa, clear rhinorrhea, slight cobblestoning HEART:  regular rate and rhythm, no murmur LUNGS: clear to auscultation bilaterally, no coughing, unlabored respiration SKIN: no rashes or lesions  Assessment:   1. Chronic rhinitis   2. Voice disorder   3. Chronic cough   4. Gastroesophageal reflux disease, unspecified whether esophagitis present     Plan/Recommendations:  Chronic Cough, Dyspnea Voice Changes  - Cough has improved a bit but still there.  Treating for PND and GERD as below.  - Discussed follow up with Pulm; has history of mediastinal mass and aspergilloma.  Was supposed to see Dr Vassie Loll but had change in insurance and now has re-established care with them- appointment upcoming.  - In the past, low suspicion for asthma; unresponsive to Albuterol and spirometry without obstruction. - Still having voice changes with history of tobacco use and family hx of brother with throat cancer, so will refer to ENT.   Chronic Rhinitis: - Improved  - Positive skin test 07/2023: none - Use nasal saline rinses before nose sprays such as with Neilmed Sinus Rinse.  Use distilled water.   - Use Flonase 2 sprays each nostril daily. Aim upward and outward. - Use Ipratroprium 1-2 sprays up to four times daily as needed for runny nose or post nasal drip. Aim upward and outward.  GERD - Controlled  - Continue Omeprazole 40mg  daily on empty stomach.  Eat a small meal about 30-45 minutes after. - Continue Pepcid 20mg  daily as needed for heartburn/reflux.  -Avoid lying down  for at least two hours after a meal or after drinking acidic beverages, like soda, or other caffeinated beverages. This can help to prevent stomach contents from flowing back into the esophagus. -Keep your head elevated while you sleep. Using an extra pillow or two can also help to prevent reflux. -Eat smaller and more frequent meals each day instead of a few large meals. This promotes digestion and can aid in preventing heartburn. -Wear loose-fitting clothes to ease pressure on the  stomach, which can worsen heartburn and reflux. -Reduce excess weight around the midsection. This can ease pressure on the stomach. Such pressure can force some stomach contents back up the esophagus.     Return in about 6 months (around 04/07/2024).  Alesia Morin, MD [facility]

## 2023-10-18 ENCOUNTER — Telehealth: Payer: Self-pay

## 2023-10-18 NOTE — Telephone Encounter (Signed)
-----   Message from Birder Robson sent at 10/09/2023 11:23 AM EST ----- Hello,  This is a [facility] that I would like for him to see ENT for voice changes (hx of tobacco use) and chronic cough please.  Thank you

## 2023-10-18 NOTE — Telephone Encounter (Signed)
Request has been faxed to the [facility] to see Dr. Suszanne Conners.   Hopefully we will hear from them soon.

## 2023-10-30 NOTE — Progress Notes (Signed)
Patient is scheduled for a PSMA PET at [facility] on 12/10.  Patient will be scheduled for Rad Onc consult post 12/10 date and RN will ensure report/imaging is available for consult.

## 2023-11-09 ENCOUNTER — Other Ambulatory Visit: Payer: Self-pay

## 2023-11-09 ENCOUNTER — Ambulatory Visit (HOSPITAL_BASED_OUTPATIENT_CLINIC_OR_DEPARTMENT_OTHER): Payer: No Typology Code available for payment source | Admitting: Pulmonary Disease

## 2023-11-09 ENCOUNTER — Encounter: Payer: Self-pay | Admitting: Radiation Oncology

## 2023-11-09 ENCOUNTER — Encounter (HOSPITAL_BASED_OUTPATIENT_CLINIC_OR_DEPARTMENT_OTHER): Payer: Self-pay | Admitting: Pulmonary Disease

## 2023-11-09 ENCOUNTER — Inpatient Hospital Stay
Admission: RE | Admit: 2023-11-09 | Discharge: 2023-11-09 | Disposition: A | Discharge status: Home / Self Care | Payer: Self-pay | Source: Ambulatory Visit | Attending: Radiation Oncology | Admitting: Radiation Oncology

## 2023-11-09 VITALS — BP 130/74 | HR 87 | Ht 74.0 in | Wt 323.4 lb

## 2023-11-09 DIAGNOSIS — J9859 Other diseases of mediastinum, not elsewhere classified: Secondary | ICD-10-CM

## 2023-11-09 DIAGNOSIS — C61 Malignant neoplasm of prostate: Secondary | ICD-10-CM

## 2023-11-09 DIAGNOSIS — G4733 Obstructive sleep apnea (adult) (pediatric): Secondary | ICD-10-CM

## 2023-11-09 DIAGNOSIS — B441 Other pulmonary aspergillosis: Secondary | ICD-10-CM

## 2023-11-09 DIAGNOSIS — J209 Acute bronchitis, unspecified: Secondary | ICD-10-CM

## 2023-11-09 NOTE — Assessment & Plan Note (Signed)
Compliant with CPAP therapy. [facility]  Weight loss encouraged, compliance with goal of at least 4-6 hrs every night is the expectation. Advised against medications with sedative side effects Cautioned against driving when sleepy - understanding that sleepiness will vary on a day to day basis

## 2023-11-09 NOTE — Assessment & Plan Note (Signed)
Appears stable and in fact was not hypermetabolic on PET scan. She did have a hypermetabolic hilar lymph nodes, question whether he has underlying sarcoid

## 2023-11-09 NOTE — Assessment & Plan Note (Signed)
He has developed mycetoma and the pneumatocele.  CT scan is very diagnostic.  This was not hypermetabolic on PET scan and no evidence of invasion. We discussed natural history of aspergilloma

## 2023-11-09 NOTE — Progress Notes (Signed)
GU Location of Tumor / Histology:  Prostate Ca  If Prostate Cancer, Gleason Score is (5 + 4) and PSA is (4.51 on 06/08/2023)  Biopsy    Past/Anticipated interventions by urology, if any:  Dr. Rogelia Mire     Past/Anticipated interventions by medical oncology, if any: NA  Weight changes, if any: {:18581}  IPSS: SHIM:  Bowel/Bladder complaints, if any: {:18581}   Nausea/Vomiting, if any: {:18581}  Pain issues, if any:  {:18581}  SAFETY ISSUES: Prior radiation? {:18581} Pacemaker/ICD? {:18581} Possible current pregnancy? Male Is the patient on methotrexate? No  Current Complaints / other details:

## 2023-11-09 NOTE — Patient Instructions (Signed)
X Rx for augmentin twice daily x 7 days  X refill on albuterol MDI

## 2023-11-09 NOTE — Progress Notes (Signed)
Subjective:    Patient ID: Christian Hubbard, male    DOB: 01-30-1961, 62 y.o.   MRN: [id number]  HPI  62 y/o former smoker for FU of  OSA & benign right upper lobe nodule -anterior mediastinal mass He quit smoking in 2006, about 20 pack years, worked in a Electronics engineer  07/2013 right lower lobe cavitary pneumonia  . 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  Positive for MAI DNA probe.    He failed a breathing test at work, he worked in [employer] with exposure to chemicals He also reports exposure to burn pits when he was in [facility], he follows with the [place] and is considered 90% disabled. He had a back injury while at work and retired since January 2023     OSA is followed by the [place], he is on nasal pillows and maintained on CPAP 12 cm   PMH -hypertension Diabetes Hyperlipidemia OSA  Annual follow-up visit He reports cough productive of yellow sputum.  He was treated by his PCP a month ago but still reports not feeling back to baseline. We reviewed CT chest from 09/2023 that shows stable pneumatocele but now with mycetoma, anterior thymic tissue does not show any changes. He was unfortunately diagnosed with prostate cancer and underwent PET scan which shows bilateral hypermetabolic hilar lymph nodes and prostate  He is compliant with CPAP, he is disabled now and not working anymore   Significant tests/ events reviewed   PFTs 09/2022 >> no airway obstruction, ratio 85, moderate restriction, FVC 59%, TLC 75% with normal DLCO suggesting extraparenchymal restriction due to obesity   CT 2014 - RLL pna + AF level , large 10cm .  CT chest /12/17 with stable 3 mm RUL nodule and RLL pneumatocele.   04/2017  CTA Chest >> neg for PE, widespread multifocal airspace consolidation compatible with severe multilobar PNA with evidence of early cavitation in some  regions, trace right pleural effusion, severe hepatic steatosis, 2 vessel CAD   06/2017: CT Chest w/ consolidation in the RLL w/ new multifocal peripheral consolidations   09/2019 PET scan -anterior mediastinal mass unchanged from 2018, new since 2016, SUV 3.5     08/2022 CT chest wo con >> Nodular soft tissue of the anterior mediastinum is unchanged in size when compared with prior exam. 2. Multiloculated cavitary lesion of the right lower lobe. 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 3. Previously described soft tissue nodularity along the superior margin of the cavity is slightly decreased in size   07/16/17 IgE 125.   Review of Systems neg for any significant sore throat, dysphagia, itching, sneezing, nasal congestion or excess/ purulent secretions, fever, chills, sweats, unintended wt loss, pleuritic or exertional cp, hempoptysis, orthopnea pnd or change in chronic leg swelling. Also denies presyncope, palpitations, heartburn, abdominal pain, nausea, vomiting, diarrhea or change in bowel or urinary habits, dysuria,hematuria, rash, arthralgias, visual complaints, headache, numbness weakness or ataxia.     Objective:   Physical Exam  Gen. Pleasant, obese, in no distress ENT - no lesions, no post nasal drip Neck: No JVD, no thyromegaly, no carotid bruits Lungs: no use of accessory muscles, no dullness to percussion, decreased without rales or rhonchi  Cardiovascular: Rhythm regular, heart sounds  normal, no murmurs or  gallops, no peripheral edema Musculoskeletal: No deformities, no cyanosis or clubbing , no tremors       Assessment & Plan:   Acute bronchitis -will treat with Augmentin for 7 days

## 2023-11-11 DIAGNOSIS — C61 Malignant neoplasm of prostate: Secondary | ICD-10-CM | POA: Insufficient documentation

## 2023-11-12 ENCOUNTER — Ambulatory Visit
Admission: RE | Admit: 2023-11-12 | Discharge: 2023-11-12 | Disposition: A | Discharge status: Home / Self Care | Payer: Non-veteran care | Source: Ambulatory Visit | Attending: Radiation Oncology

## 2023-11-12 ENCOUNTER — Encounter: Payer: Self-pay | Admitting: Radiation Oncology

## 2023-11-12 ENCOUNTER — Ambulatory Visit
Admission: RE | Admit: 2023-11-12 | Discharge: 2023-11-12 | Disposition: A | Discharge status: Home / Self Care | Payer: Non-veteran care | Source: Ambulatory Visit | Attending: Radiation Oncology | Admitting: Radiation Oncology

## 2023-11-12 VITALS — BP 129/83 | HR 74 | Temp 97.4°F | Resp 20 | Ht 74.0 in | Wt 325.0 lb

## 2023-11-12 DIAGNOSIS — Z7984 Long term (current) use of oral hypoglycemic drugs: Secondary | ICD-10-CM | POA: Diagnosis not present

## 2023-11-12 DIAGNOSIS — Z79899 Other long term (current) drug therapy: Secondary | ICD-10-CM | POA: Insufficient documentation

## 2023-11-12 DIAGNOSIS — C61 Malignant neoplasm of prostate: Secondary | ICD-10-CM | POA: Insufficient documentation

## 2023-11-12 DIAGNOSIS — I10 Essential (primary) hypertension: Secondary | ICD-10-CM | POA: Diagnosis not present

## 2023-11-12 DIAGNOSIS — E119 Type 2 diabetes mellitus without complications: Secondary | ICD-10-CM | POA: Insufficient documentation

## 2023-11-12 DIAGNOSIS — Z87891 Personal history of nicotine dependence: Secondary | ICD-10-CM | POA: Diagnosis not present

## 2023-11-12 DIAGNOSIS — E785 Hyperlipidemia, unspecified: Secondary | ICD-10-CM | POA: Diagnosis not present

## 2023-11-12 DIAGNOSIS — Z791 Long term (current) use of non-steroidal anti-inflammatories (NSAID): Secondary | ICD-10-CM | POA: Diagnosis not present

## 2023-11-12 DIAGNOSIS — K219 Gastro-esophageal reflux disease without esophagitis: Secondary | ICD-10-CM | POA: Diagnosis not present

## 2023-11-12 DIAGNOSIS — G4730 Sleep apnea, unspecified: Secondary | ICD-10-CM | POA: Insufficient documentation

## 2023-11-12 HISTORY — DX: Elevated prostate specific antigen (PSA): R97.20

## 2023-11-12 NOTE — Addendum Note (Signed)
Encounter addended by: Roel Cluck, RN on: 11/12/2023 11:12 AM  Actions taken: Charge Capture section accepted

## 2023-11-12 NOTE — Progress Notes (Signed)
Introduced myself to the patient as the prostate nurse navigator. He is here to discuss his radiation treatment options. Patient would like a second opinion for a surgical consult over at [facility] and I will work on referral/approval from the [facility]. I gave him my business card and asked him to call me with questions or concerns.  Verbalized understanding.

## 2023-11-12 NOTE — Progress Notes (Signed)
Radiation Oncology         [phone] ________________________________  Initial Outpatient Consultation  Name: Christian Hubbard MRN: [id number]  Date: 11/12/2023  DOB: [birth year]  CC:Borum, Vista Mink, MD  Tressie Ellis, MD   REFERRING PHYSICIAN: Tressie Ellis, MD  DIAGNOSIS: 62 y.o. 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    ICD-10-CM   1. Malignant neoplasm of prostate (HCC)  C61       HISTORY OF PRESENT ILLNESS: Christian Hubbard is a 61 y.o. male with a diagnosis of prostate cancer. He was noted to have an elevated PSA of 4.51 on routine labs in 05/2023 by his primary care physician, Dr. Jess Barters.  Accordingly, he was referred for evaluation in urology by Dr. Gala Lewandowsky,  digital rectal examination performed at that time showed no palpable nodules.  The patient proceeded to transrectal ultrasound with 12 biopsies of the prostate on 09/11/23.  Unfortunately, the prostate volume was not documented on his procedure report.  Out of 12 core biopsies, 12 were positive.  The maximum Gleason score was 5+4, and this was seen in cores from the right base, right apex and left base. Additionally, Gleason 4+5 was seen in the remaining cores from the right mid, left mid and left apex.  He met with Dr. Lucrezia Europe on 10/12/2023 to discuss radiation treatment options and met with Dr. Luane School on 10/16/2023 to discuss his surgical options.  A PSMA PET scan was performed on 11/07/2023 for disease staging and showed extensive activity in the prostate but no evidence of metastatic lymphadenopathy, visceral or skeletal metastases.  Incidentally noted were cavitary lesions in the right lung that were not tracer avid.  The patient reviewed the biopsy results with his urologist and he has kindly been referred today for discussion of potential radiation treatment options.   PREVIOUS RADIATION THERAPY: No  PAST MEDICAL HISTORY:  Past Medical  History:  Diagnosis Date   Depression    Diabetes mellitus without complication (HCC)    Elevated PSA    Erectile dysfunction    GERD (gastroesophageal reflux disease)    Hyperlipemia    Hypertension    Mediastinal mass    Followed by Dr. Cliffton Asters, stable 06/2021, he preferred surveillance imaging over resection for diagnostic purposes (07/15/21)   OSA on CPAP    Pneumonia 08/10/2014; 07/10/2017      PAST SURGICAL HISTORY: Past Surgical History:  Procedure Laterality Date   LIPOMA EXCISION Left 09/17/2019   Procedure: Excision of Left cheek lipoma;  Surgeon: Peggye Form, DO;  Location: [facility];  Service: Plastics;  Laterality: Left;   LUMBAR LAMINECTOMY/DECOMPRESSION MICRODISCECTOMY N/A 03/16/2022   Procedure: Microlumbar decompression Microdiscectomy Lumbar four-five, Lumbar five-Sacral one;  Surgeon: Jene Every, MD;  Location: [facility];  Service: Orthopedics;  Laterality: N/A;   PROSTATE BIOPSY     UMBILICAL HERNIA REPAIR N/A 09/17/2019   Procedure: UMBILICAL HERNIA REPAIR WITH MESH;  Surgeon: Griselda Miner, MD;  Location: [facility];  Service: General;  Laterality: N/A;   VIDEO BRONCHOSCOPY Bilateral 07/02/2017   Procedure: VIDEO BRONCHOSCOPY WITH FLUORO;  Surgeon: Oretha Milch, MD;  Location: [facility];  Service: Cardiopulmonary;  Laterality: Bilateral;    FAMILY HISTORY:  Family History  Problem Relation Age of Onset   Diabetes Mother    Asthma Sister     SOCIAL HISTORY:  Social History   Socioeconomic History   Marital status: Divorced  Spouse name: Not on file   Number of children: Not on file   Years of education: Not on file   Highest education level: Not on file  Occupational History   Not on file  Tobacco Use   Smoking status: Former    Current packs/day: 0.00    Average packs/day: 1 pack/day for 24.0 years (24.0 ttl pk-yrs)    Types: Cigarettes    Start date: 11/27/1980    Quit date: 11/27/2004    Years  since quitting: 18.9    Passive exposure: Past   Smokeless tobacco: Never  Vaping Use   Vaping status: Never Used  Substance and Sexual Activity   Alcohol use: Yes    Alcohol/week: 6.0 standard drinks of alcohol    Types: 6 Cans of beer per week    Comment: social   Drug use: No   Sexual activity: Not Currently  Other Topics Concern   Not on file  Social History Narrative   Not on file   Social Drivers of Health   Financial Resource Strain: Not on file  Food Insecurity: Not on file  Transportation Needs: Not on file  Physical Activity: Not on file  Stress: Not on file  Social Connections: Not on file  Intimate Partner Violence: Not on file    ALLERGIES: Patient has no known allergies.  MEDICATIONS:  Current Outpatient Medications  Medication Sig Dispense Refill   albuterol (VENTOLIN HFA) 108 (90 Base) MCG/ACT inhaler Inhale 2 puffs into the lungs every 6 (six) hours as needed for wheezing or shortness of breath. 8 g 2   Alpha-Lipoic Acid 200 MG CAPS      amLODipine (NORVASC) 5 MG tablet Take 5 mg by mouth daily.   0   CELEBREX 200 MG capsule Take 1 capsule every day by oral route as needed for 30 days.     docusate sodium (COLACE) 100 MG capsule Take 1 capsule (100 mg total) by mouth 2 (two) times daily as needed for mild constipation. 30 capsule 1   famotidine (PEPCID) 20 MG tablet Take 1 tablet (20 mg total) by mouth daily as needed for heartburn. 30 tablet 5   fluticasone (FLONASE) 50 MCG/ACT nasal spray Place 2 sprays into both nostrils daily. 16 g 5   ipratropium (ATROVENT) 0.03 % nasal spray Place 2 sprays into the nose 3 (three) times daily as needed for rhinitis (drainage, runny nose). 30 mL 5   lidocaine (XYLOCAINE) 5 % ointment Apply 1 Application topically daily as needed.     losartan-hydrochlorothiazide (HYZAAR) 100-12.5 MG tablet Take 1 tablet by mouth daily.  0   metFORMIN (GLUCOPHAGE) 500 MG tablet Take 500 mg by mouth 2 (two) times daily with a meal.      NEURONTIN 300 MG capsule      omeprazole (PRILOSEC) 40 MG capsule Take 40 mg by mouth daily.     OneTouch Delica Lancets 33G MISC      ONETOUCH VERIO test strip USE TO CHECK BLOOD SUGARS ONCE DAILY ALTERNATING MORNING AND EVENINGS BEFORE MEALS     polyethylene glycol (MIRALAX / GLYCOLAX) 17 g packet Take 17 g by mouth daily. (Patient not taking: Reported on 11/09/2023) 14 each 0   pravastatin (PRAVACHOL) 40 MG tablet Take 40 mg by mouth daily.     Propylene Glycol (SYSTANE BALANCE) 0.6 % SOLN Place 1 drop into both eyes daily as needed (dry eyes).     Semaglutide (OZEMPIC, 0.25 OR 0.5 MG/DOSE, [place]) Inject into the skin.  sildenafil (VIAGRA) 100 MG tablet Take 100 mg by mouth as needed.     sulfamethoxazole-trimethoprim (BACTRIM DS) 800-160 MG tablet Take 1 tablet by mouth once. (Patient not taking: Reported on 11/09/2023)     No current facility-administered medications for this encounter.    REVIEW OF SYSTEMS:  On review of systems, the patient reports that he is doing well overall. He denies any chest pain, shortness of breath, cough, fevers, chills, night sweats, unintended weight changes. He denies any bowel disturbances, and denies abdominal pain, nausea or vomiting. He denies any new musculoskeletal or joint aches or pains. His IPSS was 5, indicating mild urinary symptoms. His SHIM was 7, indicating he has severe erectile dysfunction. A complete review of systems is obtained and is otherwise negative.    PHYSICAL EXAM:  Wt Readings from Last 3 Encounters:  11/09/23 (!) 323 lb 6.4 oz (146.7 kg)  10/09/23 (!) 315 lb 14.4 oz (143.3 kg)  08/23/23 (!) 331 lb 11.2 oz (150.5 kg)   Temp Readings from Last 3 Encounters:  10/09/23 98.2 F (36.8 C) (Temporal)  08/08/23 98.5 F (36.9 C) (Temporal)  10/25/22 98.4 F (36.9 C) (Oral)   BP Readings from Last 3 Encounters:  11/09/23 130/74  10/09/23 134/84  08/08/23 132/82   Pulse Readings from Last 3 Encounters:  11/09/23 87  10/09/23  88  08/08/23 78    /10  In general this is a well appearing African-American male in no acute distress. He's alert and oriented x4 and appropriate throughout the examination. Cardiopulmonary assessment is negative for acute distress, and he exhibits normal effort.     KPS = 100  100 - Normal; no complaints; no evidence of disease. 90   - Able to carry on normal activity; minor signs or symptoms of disease. 80   - Normal activity with effort; some signs or symptoms of disease. 33   - Cares for self; unable to carry on normal activity or to do active work. 60   - Requires occasional assistance, but is able to care for most of his personal needs. 50   - Requires considerable assistance and frequent medical care. 40   - Disabled; requires special care and assistance. 30   - Severely disabled; hospital admission is indicated although death not imminent. 20   - Very sick; hospital admission necessary; active supportive treatment necessary. 10   - Moribund; fatal processes progressing rapidly. 0     - Dead  Karnofsky DA, Abelmann WH, Craver LS and Burchenal [facility] [phone]) The use of the nitrogen mustards in the palliative treatment of carcinoma: with particular reference to bronchogenic carcinoma Cancer 1 634-56  LABORATORY DATA:  Lab Results  Component Value Date   WBC 6.0 03/07/2022   HGB 15.3 03/07/2022   HCT 48.4 03/07/2022   MCV 85.4 03/07/2022   PLT 263 03/07/2022   Lab Results  Component Value Date   NA 137 03/17/2022   K 4.2 03/17/2022   CL 101 03/17/2022   CO2 27 03/17/2022   Lab Results  Component Value Date   ALT 15 (L) 07/10/2017   AST 17 07/10/2017   ALKPHOS 82 07/10/2017   BILITOT 0.6 07/10/2017     RADIOGRAPHY: No results found.    IMPRESSION/PLAN: 1. 62 y.o. gentleman with Stage T1c adenocarcinoma of the prostate with Gleason Score of 5+ 4, and PSA of 4.51. We discussed the patient's workup and outlined the nature of prostate cancer in this setting. The  patient's T stage, Gleason's  score, and PSA put him into the very high risk group. Accordingly, he is eligible for a variety of potential treatment options including prostatectomy or LT-ADT concurrent with either 8 weeks of external radiation or 5 weeks of external radiation with an upfront brachytherapy boost. We discussed the available radiation techniques, and focused on the details and logistics of delivery. We discussed and outlined the risks, benefits, short and long-term effects associated with radiotherapy and compared and contrasted these with prostatectomy. We discussed the role of SpaceOAR gel in reducing the rectal toxicity associated with radiotherapy. We also detailed the role of ADT in the treatment of high risk prostate cancer and outlined the associated side effects that could be expected with this therapy. He was encouraged to ask questions that were answered to his stated satisfaction.   The patient focused most of his questions and interest in robotic-assisted laparoscopic radical prostatectomy.  We discussed some of the potential advantages of surgery including surgical staging, the availability of salvage radiotherapy to the prostatic fossa, and the confidence associated with immediate biochemical response.  We discussed some of the potential proven indications for postoperative radiotherapy including positive margins, extracapsular extension, and seminal vesicle involvement. We also talked about some of the other potential findings leading to a recommendation for radiotherapy including a non-zero postoperative PSA and positive lymph nodes.  He appears to have a good understanding of his disease and our treatment recommendations which are of curative intent.   At the conclusion of our conversation, the patient remains undecided but appears to be most interested in moving forward with RALP vs LT-ADT concurrent with brachytherapy boost and 5 weeks of daily external beam radiation.  He is  going to take some additional time to consider his options and discuss with his family but has our contact information and will let us know once he reaches a final decision so that we can proceed with treatment planning accordingly at that time.  He did also mention that he would like to get a second opinion with a urologist here in [place] so we will work on getting this approved through the [facility].  If he ultimately elects to proceed with the LT-ADT and seed boost/external beam combination therapy, he will need a follow-up visit at the [facility] to start ADT now and a referral to one of the local urologists that will participate in the brachytherapy boost procedure.  We enjoyed meeting with him today and look forward to following along in his care.  We personally spent 70 minutes in this encounter including chart review, reviewing radiological studies, meeting face-to-face with the patient, entering orders and completing documentation.    Marguarite Arbour, PA-C    Margaretmary Dys, MD  [facility]  Radiation Oncology Direct Dial: [phone]  Fax: [phone] [email or web address]  Skype  LinkedIn

## 2023-11-13 NOTE — Progress Notes (Signed)
[facility] successfully received additional services request for referral for [facility].  Under review.  Will continue to follow.

## 2023-11-14 ENCOUNTER — Telehealth: Payer: Self-pay | Admitting: Pulmonary Disease

## 2023-11-14 MED ORDER — ALBUTEROL SULFATE HFA 108 (90 BASE) MCG/ACT IN AERS: 2.0000 | INHALATION_SPRAY | Freq: Four times a day (QID) | RESPIRATORY_TRACT | 2 refills | Status: AC | PRN

## 2023-11-14 MED ORDER — AMOXICILLIN-POT CLAVULANATE 875-125 MG PO TABS
1.0000 | ORAL_TABLET | Freq: Two times a day (BID) | ORAL | 0 refills | Status: AC
Start: 2023-11-14 — End: ?

## 2023-11-14 NOTE — Telephone Encounter (Signed)
Patient states Augmentin and Albuterol inhaler needs to be called into pharmacy. Pharmacy is [facility]. Patient phone number is [phone].

## 2023-11-14 NOTE — Telephone Encounter (Signed)
Per Dr. Reginia Naas office note from 11/09/2023,  Instructions   Return in about 1 year (around 11/08/2024). X Rx for augmentin twice daily x 7 days   X refill on albuterol MDI        I have sent in the medications and notified the patient.

## 2023-11-15 ENCOUNTER — Telehealth (INDEPENDENT_AMBULATORY_CARE_PROVIDER_SITE_OTHER): Payer: Self-pay | Admitting: Otolaryngology

## 2023-11-15 NOTE — Telephone Encounter (Signed)
Called patient and left voicemail to offer sooner appointment

## 2023-11-15 NOTE — Progress Notes (Signed)
Patient will proceed with prostatectomy with Dr. Luane School at [facility].

## 2023-11-29 ENCOUNTER — Ambulatory Visit (INDEPENDENT_AMBULATORY_CARE_PROVIDER_SITE_OTHER): Payer: No Typology Code available for payment source | Admitting: Otolaryngology

## 2023-11-29 ENCOUNTER — Encounter (INDEPENDENT_AMBULATORY_CARE_PROVIDER_SITE_OTHER): Payer: Self-pay

## 2023-11-29 VITALS — BP 142/87 | HR 84 | Resp 19 | Ht 74.0 in | Wt 325.0 lb

## 2023-11-29 DIAGNOSIS — R1319 Other dysphagia: Secondary | ICD-10-CM | POA: Diagnosis not present

## 2023-11-29 DIAGNOSIS — J323 Chronic sphenoidal sinusitis: Secondary | ICD-10-CM

## 2023-11-29 DIAGNOSIS — R4781 Slurred speech: Secondary | ICD-10-CM

## 2023-11-29 DIAGNOSIS — K219 Gastro-esophageal reflux disease without esophagitis: Secondary | ICD-10-CM

## 2023-11-29 DIAGNOSIS — R490 Dysphonia: Secondary | ICD-10-CM

## 2023-11-29 DIAGNOSIS — R0982 Postnasal drip: Secondary | ICD-10-CM

## 2023-11-29 NOTE — Progress Notes (Signed)
 Otolaryngology Clinic Note Referring provider: Dr. Delana ([facility]) HPI:  Christian Hubbard is a 63 y.o. male kindly referred by Dr. Delana ([facility]) for evaluation of dysphonia and dysphagia  Initial visit (11/2023): Patient reports: that he started to have some dysphonia for past few years. Feels like voice is more raspy, worsening with use. Comes back with rest. He reports primary problems is he feels like he is unable to project, and also reports intermittent slurs his words (can't talk correctly or form words). He also cannot talk as fast as he used to. He reports that this has been gradual, and he also now notes that he also feels like he has too much saliva in his mouth (sometimes will drool) - sometimes that also interferes with his voice/speech as well. He reports his balance may be declining, but does not know if it is because of his leg neuropathy or back problems. No problems with swallowing but sometimes slurps when he eat. No antecedent event. No cough; no recent intubations No tremors, other neurological changes No significant sinonasal hx or vertex headaches. He does have allergies and prior SPT negative; some chronic rhinitis and congestion/drainage.  Patient otherwise denies: - odynophagia, significant cough, significant dysphagia aspiration episodes or PNA, need for Heimlich, unintentional weight loss - shortness of breath, hemoptysis, tobacco or significant alcohol  history - ear pain, neck masses  No prior swallow eval He takes famotidine  for reflux  PMHx: Prostate cancer (in process of treatment), OSA on CPAP, Pulm aspergilloma, Prior PNA, DM, HTN, GERD, Allergy /cough  H&N Surgery:  Personal or FHx of bleeding dz or anesthesia difficulty: no  AP/AC: none  Tobacco: stopped 2006; 20 pack year history. Alcohol : 6 pack of beers 1-2x/week. Lives in [place], [place]   Independent Review of Additional Tests or Records:  Dr. Jude (11/09/2023) pulm - OSA and upper pulm lobe nodule;  06/2017 PNA, steroid responsive; positive for MAI; had a cough in Nov, CT 09/2023 - mycetoma IgE 07/16/2017 reviewed: 125 PET/CT (2024) independently reviewed - left max opacification (partial), right sphenoid and left frontal opacificaiton, non specific right SMG, right sphenoid and anterior larynx opacification; no obvious neck lymphadenopathy/Avidity noted [facility] on 11/06/2023 reviewed and uploaded and available in media tab: complaining of voice changes, h/o tobacco use Labs from [place] also in media tab independently reviewed: No AKI (11/2022 CMP), No leukoctyosis 11/2022 CBC Dr. Tobie 10/09/2023 Allergy : noted At last visit, we discussed following back up with Pulm as he has a hx of mediastinal mass + aspergilloma.  Spirometry normal and unresponsive to albuterol  so discussed low suspicion for obstructive lung dx. SPT was also negative, discussed starting Flonase /Ipratropium for chronic rhinitis as PND can lead to cough.  Also has reflux and is on PPI, started on PRN Pepcid .  Since last visit, he has noted some improvement in his cough.  Does still have some congestion, drainage but improved.  Using Flonase  daily and sometimes Atrovent .   He is still having trouble with voice changes with raspy voice and is very worried about that.  His brother passed from throat cancer.  He also has a smoking hx from 1990s-2006, highest use was 3/4ppd. Dx: voice changes, cough - ref to ENT for voice changes especially with brother having h/o throat cancer; Rhinitis: improved, nasal saline rinses, flonase , atrovent ;   PMH/Meds/All/SocHx/FamHx/ROS:   Past Medical History:  Diagnosis Date   Depression    Diabetes mellitus without complication (HCC)    Elevated PSA    Erectile  dysfunction    GERD (gastroesophageal reflux disease)    Hyperlipemia    Hypertension    Mediastinal mass    Followed by Dr. Shyrl, stable 06/2021, he preferred surveillance imaging over resection for diagnostic  purposes (07/15/21)   OSA on CPAP    Pneumonia 08/10/2014; 07/10/2017     Past Surgical History:  Procedure Laterality Date   LIPOMA EXCISION Left 09/17/2019   Procedure: Excision of Left cheek lipoma;  Surgeon: Lowery Estefana RAMAN, DO;  Location: [facility];  Service: Plastics;  Laterality: Left;   LUMBAR LAMINECTOMY/DECOMPRESSION MICRODISCECTOMY N/A 03/16/2022   Procedure: Microlumbar decompression Microdiscectomy Lumbar four-five, Lumbar five-Sacral one;  Surgeon: Duwayne Purchase, MD;  Location: [facility];  Service: Orthopedics;  Laterality: N/A;   PROSTATE BIOPSY     UMBILICAL HERNIA REPAIR N/A 09/17/2019   Procedure: UMBILICAL HERNIA REPAIR WITH MESH;  Surgeon: Curvin Deward MOULD, MD;  Location: [facility];  Service: General;  Laterality: N/A;   VIDEO BRONCHOSCOPY Bilateral 07/02/2017   Procedure: VIDEO BRONCHOSCOPY WITH FLUORO;  Surgeon: Jude Harden GAILS, MD;  Location: [facility];  Service: Cardiopulmonary;  Laterality: Bilateral;    Family History  Problem Relation Age of Onset   Diabetes Mother    Asthma Sister      Social Connections: Not on file      Current Outpatient Medications:    albuterol  (VENTOLIN  HFA) 108 (90 Base) MCG/ACT inhaler, Inhale 2 puffs into the lungs every 6 (six) hours as needed for wheezing or shortness of breath., Disp: 8 g, Rfl: 2   Alpha-Lipoic Acid 200 MG CAPS, , Disp: , Rfl:    amLODipine  (NORVASC ) 5 MG tablet, Take 5 mg by mouth daily. , Disp: , Rfl: 0   amoxicillin -clavulanate (AUGMENTIN ) 875-125 MG tablet, Take 1 tablet by mouth 2 (two) times daily., Disp: 14 tablet, Rfl: 0   CELEBREX  200 MG capsule, Take 1 capsule every day by oral route as needed for 30 days., Disp: , Rfl:    docusate sodium  (COLACE) 100 MG capsule, Take 1 capsule (100 mg total) by mouth 2 (two) times daily as needed for mild constipation., Disp: 30 capsule, Rfl: 1   famotidine  (PEPCID ) 20 MG tablet, Take 1 tablet (20 mg total) by mouth daily as needed  for heartburn., Disp: 30 tablet, Rfl: 5   fluticasone  (FLONASE ) 50 MCG/ACT nasal spray, Place 2 sprays into both nostrils daily., Disp: 16 g, Rfl: 5   guaiFENesin (MUCINEX) 600 MG 12 hr tablet, Take 600 mg by mouth 2 (two) times daily as needed., Disp: , Rfl:    ipratropium (ATROVENT ) 0.03 % nasal spray, Place 2 sprays into the nose 3 (three) times daily as needed for rhinitis (drainage, runny nose)., Disp: 30 mL, Rfl: 5   lidocaine  (XYLOCAINE ) 5 % ointment, Apply 1 Application topically daily as needed., Disp: , Rfl:    losartan -hydrochlorothiazide  (HYZAAR) 100-12.5 MG tablet, Take 1 tablet by mouth daily., Disp: , Rfl: 0   metFORMIN (GLUCOPHAGE) 500 MG tablet, Take 500 mg by mouth 2 (two) times daily with a meal., Disp: , Rfl:    NEURONTIN  300 MG capsule, , Disp: , Rfl:    omeprazole (PRILOSEC) 40 MG capsule, Take 40 mg by mouth daily., Disp: , Rfl:    OneTouch Delica Lancets 33G MISC, , Disp: , Rfl:    ONETOUCH VERIO test strip, USE TO CHECK BLOOD SUGARS ONCE DAILY ALTERNATING MORNING AND EVENINGS BEFORE MEALS, Disp: , Rfl:    polyethylene glycol (MIRALAX  / GLYCOLAX )  17 g packet, Take 17 g by mouth daily., Disp: 14 each, Rfl: 0   pravastatin  (PRAVACHOL ) 40 MG tablet, Take 40 mg by mouth daily., Disp: , Rfl:    Propylene Glycol (SYSTANE BALANCE) 0.6 % SOLN, Place 1 drop into both eyes daily as needed (dry eyes)., Disp: , Rfl:    Semaglutide (OZEMPIC, 0.25 OR 0.5 MG/DOSE, Bandana), Inject into the skin., Disp: , Rfl:    sildenafil (VIAGRA) 100 MG tablet, Take 100 mg by mouth as needed., Disp: , Rfl:    sulfamethoxazole-trimethoprim (BACTRIM DS) 800-160 MG tablet, Take 1 tablet by mouth once., Disp: , Rfl:    Physical Exam:   BP (!) 142/87 (BP Location: Right Arm, Patient Position: Sitting, Cuff Size: Normal)   Pulse 84   Resp 19   Ht 6' 2 (1.88 m)   Wt (!) 325 lb (147.4 kg)   SpO2 94%   BMI 41.73 kg/m   Salient findings:  CN II-XII intact; no significant ptosis, tremor  Bilateral EAC clear  and TM intact with well pneumatized middle ear spaces Weber 512: mid Anterior rhinoscopy: Septum intact; bilateral inferior turbinates without significant hypertrophy No lesions of oral cavity/oropharynx No obviously palpable neck masses/lymphadenopathy/thyromegaly Query some tongue fasciculations; strength 5/5 b/l UE and LE; sensation intact No respiratory distress or stridor; voice quality class 2; easily tolerates secretions  Seprately Identifiable Procedures:  Procedure Note Pre-procedure diagnosis:  Dysphonia, concern for proximal airway pathology, dysphagia Post-procedure diagnosis: Same Procedure: Transnasal Fiberoptic Laryngoscopy, CPT 31575 - Mod 25 Indication: Dysphonia, concern for proximal airway pathology, dysphagia Complications: None apparent EBL: 0 mL  The procedure was undertaken to further evaluate the patient's complaint of Dysphonia, concern for proximal airway pathology, dysphagia, with mirror exam inadequate for appropriate examination due to gag reflex and poor patient tolerance  Procedure:  Patient was identified as correct patient. Verbal consent was obtained. The nose was sprayed with oxymetazoline and 4% lidocaine . The The flexible laryngoscope was passed through the nose to view the nasal cavity, pharynx (oropharynx, hypopharynx) and larynx.  The larynx was examined at rest and during multiple phonatory tasks. Documentation was obtained and reviewed with patient. The scope was removed. The patient tolerated the procedure well.  Findings: The nasal cavity and nasopharynx did not reveal any masses or lesions, mucosa appeared to be without obvious lesions. The tongue base, pharyngeal walls, piriform sinuses, vallecula, epiglottis and postcricoid region are normal in appearance without significant retained secretions over pyriform or significant post-cricoid edema The visualized portion of the subglottis and proximal trachea is widely patent. The vocal folds are mobile  bilaterally. There are no lesions on the free edge of the vocal folds nor elsewhere in the larynx worrisome for malignancy.  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 male with prostate cancer and DM now with:  1. Slurred speech   2. Dysphonia   3. Other dysphagia   4. Gastroesophageal reflux disease without esophagitis   5. Chronic sphenoidal sinusitis   6. Post-nasal drip    It appears that his main problem is not related to dysphonia but rather lack of speech projection and slurring of his words.  He is also having some dysphagia.  His TFL is reassuring and given his progressive decline and reassuring TFL and query tongue fasciculations, I wonder if there is an underlying neurological condition at  play here.  Could be muscle tension dysphonia from a dysphonia standpoint but his other symptoms do not appear to correlate.  We discussed his options, including R/B/A for each option. He has elected to proceed with: Referral to neurology for any further workup and testing; will defer any possible imaging to them but do not feel strongly he requires it D/w pt speech therapy for MTD but declined Continue reflux meds Will order MBS for dysphagia No nasal sx except for PND - does have sphenoid and frontal opacification on PET/CT with some mild hyperavidity on right sphenoid - likely sinusitis v/s fungal ball; given lack of other specific sx, continue atrovent , flonase  and rinses and will observe  - f/u 3 months  See below regarding exact medications prescribed this encounter including dosages and route: No orders of the defined types were placed in this encounter.     Thank you for allowing me the opportunity to care for your patient. Please do not hesitate to contact me should you have any other questions.  Sincerely, Eldora Blanch,  MD Otolarynoglogist (ENT), [facility] Phone: [phone] Fax: [phone]  12/08/2023, 6:52 PM   MDM:  Level 4 Complexity/Problems addressed: mod - multiple chronic problems Data complexity: mod - independent review of PET imaging; review of notes and labs  - Morbidity: unclear but likely low - Prescription Drug prescribed or managed: no

## 2023-11-29 NOTE — Patient Instructions (Signed)
 I have ordered an imaging study for you to complete prior to your next visit. Please call [facility] Scheduling at [phone] to schedule your imaging if you have not received a call within 24 hours. If you are unable to complete your imaging study prior to your next scheduled visit please call our office to let us  know.

## 2023-11-30 ENCOUNTER — Other Ambulatory Visit (HOSPITAL_COMMUNITY): Payer: Self-pay | Admitting: *Deleted

## 2023-11-30 DIAGNOSIS — R1310 Dysphagia, unspecified: Secondary | ICD-10-CM

## 2023-12-06 ENCOUNTER — Institutional Professional Consult (permissible substitution) (INDEPENDENT_AMBULATORY_CARE_PROVIDER_SITE_OTHER): Payer: Non-veteran care

## 2023-12-20 ENCOUNTER — Ambulatory Visit (HOSPITAL_COMMUNITY)
Admission: RE | Admit: 2023-12-20 | Discharge: 2023-12-20 | Disposition: A | Discharge status: Home / Self Care | Payer: No Typology Code available for payment source | Source: Ambulatory Visit | Attending: Internal Medicine | Admitting: Internal Medicine

## 2023-12-20 DIAGNOSIS — R1310 Dysphagia, unspecified: Secondary | ICD-10-CM

## 2023-12-20 DIAGNOSIS — R1319 Other dysphagia: Secondary | ICD-10-CM

## 2023-12-20 DIAGNOSIS — R479 Unspecified speech disturbances: Secondary | ICD-10-CM | POA: Insufficient documentation

## 2023-12-20 DIAGNOSIS — R490 Dysphonia: Secondary | ICD-10-CM

## 2024-01-16 ENCOUNTER — Ambulatory Visit: Payer: Non-veteran care | Admitting: Neurology

## 2024-02-04 ENCOUNTER — Ambulatory Visit (INDEPENDENT_AMBULATORY_CARE_PROVIDER_SITE_OTHER): Payer: Non-veteran care

## 2024-04-21 NOTE — Patient Instructions (Incomplete)
 Chronic Cough, Dyspnea - Continue to  follow up with Pulm; has history of mediastinal mass and aspergilloma.    - Can be related to uncontrolled GERD or post nasal drip.   - In the past, low suspicion for asthma; unresponsive to Albuterol  and spirometry without obstruction. -continue to follow up with ENT  Chronic Rhinitis: - Positive skin test 07/2023: none - Use nasal saline rinses before nose sprays such as with Neilmed Sinus Rinse.  Use distilled water.   - Use Flonase  2 sprays each nostril daily. Aim upward and outward. - Use Ipratroprium 1-2 sprays up to four times daily as needed for runny nose or post nasal drip. Aim upward and outward.  GERD - Continue Omeprazole 40mg  daily on empty stomach.  Eat a small meal about 30-45 minutes after. - Continue Pepcid  20mg  daily as needed for heartburn/reflux.  -Avoid lying down for at least two hours after a meal or after drinking acidic beverages, like soda, or other caffeinated beverages. This can help to prevent stomach contents from flowing back into the esophagus. -Keep your head elevated while you sleep. Using an extra pillow or two can also help to prevent reflux. -Eat smaller and more frequent meals each day instead of a few large meals. This promotes digestion and can aid in preventing heartburn. -Wear loose-fitting clothes to ease pressure on the stomach, which can worsen heartburn and reflux. -Reduce excess weight around the midsection. This can ease pressure on the stomach. Such pressure can force some stomach contents back up the esophagus.   Follow up in months or sooner if needed

## 2024-04-22 ENCOUNTER — Ambulatory Visit: Payer: No Typology Code available for payment source | Admitting: Internal Medicine

## 2024-04-22 ENCOUNTER — Ambulatory Visit (INDEPENDENT_AMBULATORY_CARE_PROVIDER_SITE_OTHER): Admitting: Family

## 2024-04-22 ENCOUNTER — Other Ambulatory Visit: Payer: Self-pay

## 2024-04-22 ENCOUNTER — Encounter: Payer: Self-pay | Admitting: Family

## 2024-04-22 VITALS — BP 134/80 | HR 76 | Temp 98.3°F | Resp 19 | Ht 74.0 in | Wt 310.5 lb

## 2024-04-22 DIAGNOSIS — J310 Chronic rhinitis: Secondary | ICD-10-CM

## 2024-04-22 DIAGNOSIS — R499 Unspecified voice and resonance disorder: Secondary | ICD-10-CM | POA: Diagnosis not present

## 2024-04-22 DIAGNOSIS — R053 Chronic cough: Secondary | ICD-10-CM | POA: Diagnosis not present

## 2024-04-22 DIAGNOSIS — K219 Gastro-esophageal reflux disease without esophagitis: Secondary | ICD-10-CM

## 2024-04-22 NOTE — Progress Notes (Signed)
 [address] [place] [address] Dept: [phone]  FOLLOW UP NOTE  Patient ID: Christian Hubbard, male    DOB: [birth year]  Age: 63 y.o. MRN: [id number] Date of Office Visit: 04/22/2024  Assessment  Chief Complaint: Wheezing (Patients has some wheezing every now and than, other than this everything is fine. )  HPI Christian Hubbard is a 63 year old male who presents today for follow-up of chronic cough, dyspnea, voice changes, chronic rhinitis, and GERD.  He was last seen on October 09, 2023 by Dr. Lydia Sams.  Since his last office visit he did have a laparoscopic radical prostatectomy on December 28, 2023.  Chronic cough, dyspnea, voice changes: He reports that the cough is a a lot better.  He still has some shortness of breath and follows up with pulmonology, Dr. Villa Greaser.  He does mention every now and then he will hear wheezing and will use albuterol  and it helps a little.  His albuterol  was prescribed by his pulmonologist.  He denies tightness in his chest.  Since his last office visit he did follow-up with his pulmonologist Dr. Villa Greaser on November 09, 2023 and was treated with Augmentin  for 7 days for acute bronchitis.  He follows up with pulmonology for pulmonary aspergilloma, mediastinal mass, and obstructive sleep apnea.  He did see Dr. Lydia Sams, ENT on November 29, 2023 for voice changes.  This office visit shows: " Procedure Note Pre-procedure diagnosis:  Dysphonia, concern for proximal airway pathology, dysphagia Post-procedure diagnosis: Same Procedure: Transnasal Fiberoptic Laryngoscopy, CPT 31575 - Mod 25 Indication: Dysphonia, concern for proximal airway pathology, dysphagia Complications: None apparent EBL: 0 mL   The procedure was undertaken to further evaluate the patient's complaint of Dysphonia, concern for proximal airway pathology, dysphagia, with mirror exam inadequate for appropriate examination due to gag reflex and poor patient tolerance   Procedure:  Patient was  identified as correct patient. Verbal consent was obtained. The nose was sprayed with oxymetazoline and 4% lidocaine . The The flexible laryngoscope was passed through the nose to view the nasal cavity, pharynx (oropharynx, hypopharynx) and larynx.  The larynx was examined at rest and during multiple phonatory tasks. Documentation was obtained and reviewed with patient. The scope was removed. The patient tolerated the procedure well.   Findings: The nasal cavity and nasopharynx did not reveal any masses or lesions, mucosa appeared to be without obvious lesions. The tongue base, pharyngeal walls, piriform sinuses, vallecula, epiglottis and postcricoid region are normal in appearance without significant retained secretions over pyriform or significant post-cricoid edema The visualized portion of the subglottis and proximal trachea is widely patent. The vocal folds are mobile bilaterally. There are no lesions on the free edge of the vocal folds nor elsewhere in the larynx worrisome for malignancy.  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 male with prostate cancer and DM now with:   1. Slurred speech   2. Dysphonia   3. Other dysphagia   4. Gastroesophageal reflux disease without esophagitis   5. Chronic sphenoidal sinusitis   6. Post-nasal drip     It appears that his main problem is not related to dysphonia but rather lack of speech projection and slurring of his words.  He is also having some dysphagia.  His TFL is reassuring and given his progressive decline and reassuring TFL and query tongue fasciculations, I wonder if there is an underlying neurological condition at play here.  Could be muscle tension  dysphonia from a dysphonia standpoint but his other symptoms do not appear to correlate.   We discussed his options, including R/B/A for each option. He has elected to proceed with: Referral to neurology for any further workup and  testing; will defer any possible imaging to them but do not feel strongly he requires it D/w pt speech therapy for MTD but declined Continue reflux meds Will order MBS for dysphagia No nasal sx except for PND - does have sphenoid and frontal opacification on PET/CT with some mild hyperavidity on right sphenoid - likely sinusitis v/s fungal ball; given lack of other specific sx, continue atrovent , flonase  and rinses and will observe   - f/u 3 months"  His modified barium swallow from December 20, 2023 shows:   "Recommendations/Plan: Swallowing Evaluation Recommendations Swallowing Evaluation Recommendations Recommendations: PO diet PO Diet Recommendation: Regular; Thin liquids (Level 0) Liquid Administration via: Cup; Straw Medication Administration: Whole meds with liquid Supervision: Patient able to self-feed Swallowing strategies  : Slow rate; Small bites/sips Postural changes: Position pt fully upright for meals; Stay upright 30-60 min after meals Oral care recommendations: Oral care BID (2x/day) Recommended consults: Other(comment) (neuro consult advised)"  He reports that he did not follow-up with ENT in 3 months as recommended, because of frustration of their office not making the referral to neurology.  Chronic rhinitis: He reports that he has excessive saliva and drools sometimes and that is why he went to see ENT and he told ENT about this.  Sometimes this secretion goes down his throat like he swallowed it the wrong way and it will cause him to cough.  He denies rhinorrhea, nasal congestion, and postnasal drip.  He has not been treated for any sinus infections since we last saw him.  He is currently using Flonase  nasal spray as needed and ipratropium nasal spray as needed.  Gastroesophageal reflux disease.  He does take omeprazole 40 mg once a day.  He is not certain about taking the Pepcid  20 mg daily as needed.  He reports that he just "throws in his day-to-day medicine.".  At  times he can feel the reflux coming up and will get a taste in his mouth.  It depends on his diet.  The symptoms do not occur daily.  A large cup of tea will last him about 3 days.  He eats a piece of chocolate every now and then.  He also will drink soda every now and then.  He does try to drink a lot of water.   Drug Allergies:  No Known Allergies  Review of Systems: Negative except as per HPI   Physical Exam: BP 134/80 (BP Location: Left Arm, Patient Position: Sitting, Cuff Size: Normal)   Pulse 76   Temp 98.3 F (36.8 C) (Temporal)   Resp 19   Ht 6\' 2"  (1.88 m)   Wt (!) 310 lb 8 oz (140.8 kg)   BMI 39.87 kg/m    Physical Exam Constitutional:      Appearance: Normal appearance.  HENT:     Head: Normocephalic and atraumatic.     Comments: Pharynx normal, eyes normal, ears normal, nose: Bilateral lower turbinates mildly edematous with no drainage noted    Right Ear: Tympanic membrane, ear canal and external ear normal.     Left Ear: Tympanic membrane, ear canal and external ear normal.     Mouth/Throat:     Mouth: Mucous membranes are moist.     Pharynx: Oropharynx is clear.  Eyes:  Conjunctiva/sclera: Conjunctivae normal.  Cardiovascular:     Rate and Rhythm: Regular rhythm.     Heart sounds: Normal heart sounds.  Pulmonary:     Effort: Pulmonary effort is normal.     Breath sounds: Normal breath sounds.     Comments: Lungs clear to auscultation Musculoskeletal:     Cervical back: Neck supple.  Skin:    General: Skin is warm.  Neurological:     Mental Status: He is alert and oriented to person, place, and time.  Psychiatric:        Mood and Affect: Mood normal.        Behavior: Behavior normal.        Thought Content: Thought content normal.        Judgment: Judgment normal.     Diagnostics:  none  Assessment and Plan: 1. Chronic rhinitis   2. Chronic cough   3. Gastroesophageal reflux disease, unspecified whether esophagitis present   4. Change of  voice     No orders of the defined types were placed in this encounter.   Patient Instructions  Chronic Cough, Dyspnea - Continue to  follow up with Pulm; has history of mediastinal mass and aspergilloma.    - Can be related to uncontrolled GERD or post nasal drip.   - In the past, low suspicion for asthma; unresponsive to Albuterol  and spirometry without obstruction. -Schedule a  follow up appointment with ENT to discuss referral to neurology and sinusitis  Chronic Rhinitis: - Positive skin test 07/2023: none - Use nasal saline rinses before nose sprays such as with Neilmed Sinus Rinse.  Use distilled water.   - Use Flonase  2 sprays each nostril daily. Aim upward and outward. - Use Ipratroprium 1-2 sprays up to four times daily as needed for runny nose or post nasal drip. Aim upward and outward.  GERD - Continue Omeprazole 40mg  daily on empty stomach.  Eat a small meal about 30-45 minutes after. - Continue Pepcid  20mg  daily as needed for heartburn/reflux.  -Avoid lying down for at least two hours after a meal or after drinking acidic beverages, like soda, or other caffeinated beverages. This can help to prevent stomach contents from flowing back into the esophagus. -Keep your head elevated while you sleep. Using an extra pillow or two can also help to prevent reflux. -Eat smaller and more frequent meals each day instead of a few large meals. This promotes digestion and can aid in preventing heartburn. -Wear loose-fitting clothes to ease pressure on the stomach, which can worsen heartburn and reflux. -Reduce excess weight around the midsection. This can ease pressure on the stomach. Such pressure can force some stomach contents back up the esophagus.   Follow up in 6 months or sooner if needed Return in about 6 months (around 10/23/2024), or if symptoms worsen or fail to improve.    Thank you for the opportunity to care for this patient.  Please do not hesitate to contact me with  questions.  Tinnie Forehand, FNP [facility] 

## 2024-07-10 ENCOUNTER — Telehealth: Payer: Self-pay | Admitting: Internal Medicine

## 2024-07-10 NOTE — Telephone Encounter (Signed)
 Spoke with pt and informed him current authorization expires on 07/31/2024 and a new authorization has been sent in and for him to call the [place] for a new authorization.

## 2024-10-28 ENCOUNTER — Encounter: Payer: Self-pay | Admitting: Internal Medicine

## 2024-10-28 ENCOUNTER — Other Ambulatory Visit: Payer: Self-pay

## 2024-10-28 ENCOUNTER — Ambulatory Visit: Admitting: Internal Medicine

## 2024-10-28 VITALS — BP 134/86 | HR 76 | Temp 98.3°F | Ht 74.0 in | Wt 317.9 lb

## 2024-10-28 DIAGNOSIS — J310 Chronic rhinitis: Secondary | ICD-10-CM | POA: Diagnosis not present

## 2024-10-28 DIAGNOSIS — R053 Chronic cough: Secondary | ICD-10-CM

## 2024-10-28 DIAGNOSIS — K219 Gastro-esophageal reflux disease without esophagitis: Secondary | ICD-10-CM

## 2024-10-28 MED ORDER — CETIRIZINE HCL 10 MG PO TABS: 10.0000 mg | ORAL_TABLET | Freq: Every day | ORAL | 5 refills | Status: AC

## 2024-10-28 MED ORDER — IPRATROPIUM BROMIDE 0.03 % NA SOLN: 2.0000 | Freq: Three times a day (TID) | NASAL | 5 refills | Status: AC | PRN

## 2024-10-28 MED ORDER — AZELASTINE HCL 0.1 % NA SOLN: 2.0000 | Freq: Two times a day (BID) | NASAL | 5 refills | Status: AC

## 2024-10-28 MED ORDER — FLUTICASONE PROPIONATE 50 MCG/ACT NA SUSP: 2.0000 | Freq: Every day | NASAL | 5 refills | Status: AC

## 2024-10-28 NOTE — Patient Instructions (Addendum)
 Chronic Cough, Dyspnea - Schedule follow up with Pulm; has history of mediastinal mass and aspergilloma.   [phone] - Can also be related to uncontrolled GERD or post nasal drip.  See below.   Chronic Rhinitis: - Positive skin test 07/2023: none - Use nasal saline rinses before nose sprays such as with Neilmed Sinus Rinse.  Use distilled water.   - Use Flonase  2 sprays each nostril daily. Aim upward and outward. - Use Azelastine 2 sprays each nostril twice daily.  Aim upward and outward.   - Use Ipratroprium 1-2 sprays up to four times daily as needed for runny nose or post nasal drip. Aim upward and outward. - Use Zyrtec 10mg  daily as needed for runny nose, sneezing.  - Follow up with ENT for dysphonia. [phone]   GERD - Continue Omeprazole 40mg  daily on empty stomach.  Eat a small meal about 30-45 minutes after. - Continue Pepcid  20mg  daily as needed for heartburn/reflux.  -Avoid lying down for at least two hours after a meal or after drinking acidic beverages, like soda, or other caffeinated beverages. This can help to prevent stomach contents from flowing back into the esophagus. -Keep your head elevated while you sleep. Using an extra pillow or two can also help to prevent reflux. -Eat smaller and more frequent meals each day instead of a few large meals. This promotes digestion and can aid in preventing heartburn. -Wear loose-fitting clothes to ease pressure on the stomach, which can worsen heartburn and reflux. -Reduce excess weight around the midsection. This can ease pressure on the stomach. Such pressure can force some stomach contents back up the esophagus.

## 2024-10-28 NOTE — Progress Notes (Signed)
 FOLLOW UP Date of Service/Encounter:  10/28/24   Subjective:  Christian Hubbard (DOB: 1961-11-15) is a 63 y.o. male who [facility] for follow up for cough, rhinitis, GERD.   History obtained from: chart review and patient. Last seen on 04/22/2024 with Wanda Craze  Chronic cough/dyspnea: f/u with Pulm and also f/u ENT for dysphonia/sinusitis. CR- Flonase /Atrovent  PRN GERD- PPI + pepcid  PRN  Reports still having trouble with post nasal drainage and runny nose.  Also notes persistent cough with it.  No wheezing.  Not sure what exactly he is using but thinks he is taking a nasal spray.  Has not followed back up with Pulm or ENT.  He is going to call today to schedule and see if he needs a community care referral from [place].    Reflux is doing well as long as he takes his medications.  Taking PPI daily, not much of Pepcid .   Past Medical History: Past Medical History:  Diagnosis Date   Depression    Diabetes mellitus without complication (HCC)    Elevated PSA    Erectile dysfunction    GERD (gastroesophageal reflux disease)    Hyperlipemia    Hypertension    Mediastinal mass    Followed by Dr. Shyrl, stable 06/2021, he preferred surveillance imaging over resection for diagnostic purposes (07/15/21)   OSA on CPAP    Pneumonia 08/10/2014; 07/10/2017    Objective:  BP 134/86 (BP Location: Left Arm, Patient Position: Sitting, Cuff Size: Large)   Pulse 76   Temp 98.3 F (36.8 C) (Temporal)   Ht 6' 2 (1.88 m)   Wt (!) 317 lb 14.4 oz (144.2 kg)   SpO2 96%   BMI 40.82 kg/m  Body mass index is 40.82 kg/m. Physical Exam: GEN: alert, well developed HEENT: clear conjunctiva, nose with mild inferior turbinate hypertrophy, pink nasal mucosa, no rhinorrhea, + cobblestoning HEART: regular rate and rhythm, no murmur LUNGS: clear to auscultation bilaterally, no coughing, unlabored respiration SKIN: no rashes or lesions  Assessment:   1.  Chronic rhinitis   2. Chronic cough   3. Gastroesophageal reflux disease, unspecified whether esophagitis present     Plan/Recommendations:  Chronic Cough, Dyspnea - Uncontrolled.  - Schedule follow up with Pulm; has history of mediastinal mass and aspergilloma.   [phone] - Can also be related to uncontrolled GERD or post nasal drip.  See below.  - In the past, low suspicion for asthma; unresponsive to Albuterol  and spirometry without obstruction.  Chronic Rhinitis: - Uncontrolled, discussed specifically when to use which nasal spray and try anti histamine to see if we can dry out the drainage.  - Positive skin test 07/2023: none - Use nasal saline rinses before nose sprays such as with Neilmed Sinus Rinse.  Use distilled water.   - Use Flonase  2 sprays each nostril daily. Aim upward and outward. - Use Azelastine 2 sprays each nostril twice daily.  Aim upward and outward.   - Use Ipratroprium 1-2 sprays up to four times daily as needed for runny nose or post nasal drip. Aim upward and outward. - Use Zyrtec 10mg  daily as needed for runny nose, sneezing.  - Follow up with ENT for dysphonia. [phone]   GERD - Controlled  - Continue Omeprazole 40mg  daily on empty stomach.  Eat a small meal about 30-45 minutes after. - Continue Pepcid  20mg  daily as needed for heartburn/reflux.  -Avoid lying down for at least two hours after  a meal or after drinking acidic beverages, like soda, or other caffeinated beverages. This can help to prevent stomach contents from flowing back into the esophagus. -Keep your head elevated while you sleep. Using an extra pillow or two can also help to prevent reflux. -Eat smaller and more frequent meals each day instead of a few large meals. This promotes digestion and can aid in preventing heartburn. -Wear loose-fitting clothes to ease pressure on the stomach, which can worsen heartburn and reflux. -Reduce excess weight around the midsection. This can ease  pressure on the stomach. Such pressure can force some stomach contents back up the esophagus.       Return in about 6 months (around 04/28/2025).  Arleta Blanch, MD [facility] 

## 2024-11-13 NOTE — Telephone Encounter (Signed)
 New [facility] authorization has been placed in the chart

## 2025-04-28 ENCOUNTER — Ambulatory Visit: Admitting: Allergy and Immunology
